# Patient Record
Sex: Female | Born: 1960
Health system: Southern US, Community
[De-identification: ages and names within clinical notes are randomized; demographics above are authoritative.]

## PROBLEM LIST (undated history)

## (undated) DIAGNOSIS — E0500 Thyrotoxicosis with diffuse goiter without thyrotoxic crisis or storm: Secondary | ICD-10-CM

## (undated) DIAGNOSIS — E079 Disorder of thyroid, unspecified: Secondary | ICD-10-CM

## (undated) HISTORY — DX: Thyrotoxicosis with diffuse goiter without thyrotoxic crisis or storm: E05.00

## (undated) HISTORY — DX: Disorder of thyroid, unspecified: E07.9

---

## 2004-08-06 HISTORY — PX: BREAST CYST EXCISION: SHX579

## 2004-08-06 HISTORY — PX: BREAST BIOPSY: SHX20

## 2007-08-12 ENCOUNTER — Encounter: Admission: RE | Admit: 2007-08-12 | Discharge: 2007-08-12 | Payer: Self-pay | Admitting: Obstetrics & Gynecology

## 2007-12-03 ENCOUNTER — Ambulatory Visit: Payer: Self-pay | Admitting: Otolaryngology

## 2008-08-06 DIAGNOSIS — E0500 Thyrotoxicosis with diffuse goiter without thyrotoxic crisis or storm: Secondary | ICD-10-CM

## 2008-08-06 HISTORY — DX: Thyrotoxicosis with diffuse goiter without thyrotoxic crisis or storm: E05.00

## 2009-04-06 DIAGNOSIS — E063 Autoimmune thyroiditis: Secondary | ICD-10-CM | POA: Insufficient documentation

## 2009-04-06 DIAGNOSIS — E079 Disorder of thyroid, unspecified: Secondary | ICD-10-CM

## 2009-04-06 HISTORY — DX: Disorder of thyroid, unspecified: E07.9

## 2009-04-28 ENCOUNTER — Encounter (HOSPITAL_COMMUNITY): Admission: RE | Admit: 2009-04-28 | Discharge: 2009-07-08 | Payer: Self-pay | Admitting: Endocrinology

## 2009-10-25 ENCOUNTER — Encounter: Admission: RE | Admit: 2009-10-25 | Discharge: 2009-10-25 | Payer: Self-pay | Admitting: Obstetrics & Gynecology

## 2010-11-03 ENCOUNTER — Other Ambulatory Visit: Payer: Self-pay | Admitting: Obstetrics & Gynecology

## 2010-11-03 DIAGNOSIS — Z0000 Encounter for general adult medical examination without abnormal findings: Secondary | ICD-10-CM

## 2010-11-06 ENCOUNTER — Ambulatory Visit
Admission: RE | Admit: 2010-11-06 | Discharge: 2010-11-06 | Disposition: A | Discharge status: Home / Self Care | Payer: Federal, State, Local not specified - PPO | Source: Ambulatory Visit | Attending: Obstetrics & Gynecology | Admitting: Obstetrics & Gynecology

## 2010-11-06 DIAGNOSIS — Z0000 Encounter for general adult medical examination without abnormal findings: Secondary | ICD-10-CM

## 2010-11-06 LAB — HM MAMMOGRAPHY: HM Mammogram: NORMAL

## 2011-04-21 ENCOUNTER — Encounter: Payer: Self-pay | Admitting: Internal Medicine

## 2011-06-01 ENCOUNTER — Other Ambulatory Visit: Payer: Self-pay | Admitting: Internal Medicine

## 2011-06-01 MED ORDER — OMEPRAZOLE 20 MG PO CPDR: 20.0000 mg | DELAYED_RELEASE_CAPSULE | Freq: Every day | ORAL | Status: DC

## 2011-10-11 ENCOUNTER — Other Ambulatory Visit: Payer: Self-pay | Admitting: Obstetrics & Gynecology

## 2011-10-11 DIAGNOSIS — Z1231 Encounter for screening mammogram for malignant neoplasm of breast: Secondary | ICD-10-CM

## 2011-11-05 ENCOUNTER — Other Ambulatory Visit: Payer: Self-pay | Admitting: Internal Medicine

## 2011-11-05 MED ORDER — OMEPRAZOLE 20 MG PO CPDR: 20.0000 mg | DELAYED_RELEASE_CAPSULE | Freq: Every day | ORAL | Status: DC

## 2011-11-07 ENCOUNTER — Ambulatory Visit (INDEPENDENT_AMBULATORY_CARE_PROVIDER_SITE_OTHER): Payer: Federal, State, Local not specified - PPO | Admitting: Internal Medicine

## 2011-11-07 ENCOUNTER — Encounter: Payer: Self-pay | Admitting: Internal Medicine

## 2011-11-07 VITALS — BP 120/68 | HR 77 | Temp 98.7°F | Resp 16 | Ht 68.0 in | Wt 142.2 lb

## 2011-11-07 DIAGNOSIS — R5383 Other fatigue: Secondary | ICD-10-CM

## 2011-11-07 DIAGNOSIS — E538 Deficiency of other specified B group vitamins: Secondary | ICD-10-CM

## 2011-11-07 DIAGNOSIS — B369 Superficial mycosis, unspecified: Secondary | ICD-10-CM

## 2011-11-07 DIAGNOSIS — R5381 Other malaise: Secondary | ICD-10-CM

## 2011-11-07 DIAGNOSIS — E0500 Thyrotoxicosis with diffuse goiter without thyrotoxic crisis or storm: Secondary | ICD-10-CM

## 2011-11-07 MED ORDER — KETOCONAZOLE 2 % EX GEL: CUTANEOUS | Status: DC

## 2011-11-07 NOTE — Patient Instructions (Signed)
We are checking your for vitamin and iron deficiencies today and for anemia.  If everything is normal,  I suggest increasing your thyroid medication to 88 mcg 6 days per week,  75 mcg one day per week and repeating a TSH in 6 weeks.   I also recommend resuming an aerobic exercise regimen (30 minutes,  5 days per week to see if it helps your energy level.

## 2011-11-07 NOTE — Progress Notes (Signed)
Patient ID: Rebecca Cole, female   DOB: 01/30/1961, 51 y.o.   MRN: [id number]  Patient Active Problem List  Diagnoses  . Graves' orbitopathy  . Thyroid disease  . Dermatitis fungal  . Fatigue    Subjective:  CC:   Chief Complaint  Patient presents with  . Follow-up    sore behind ears    HPI:   Rebecca Cole a 51 y.o. female who presents for follow up on chronic medical issues.  She continues to have a recurrent crusting dermatitis which occurs behind both ears.  At her last visit I suggested treating the rash with over-the-counter Lotrimin as I suspected that the problem was due to fungal infection do to increase moisture. The rash has improved with use of Lotrimin OTC but it recurs when she suspense treatment .  She has been experimenting with use of Seabreeze on  one side and Lotrimin on the other. This degrees essentially dries out the area and does keep the skin from becoming irritated. However the area continues to be dictated by her eyeglasses.When she stopsusing Lotrimin  the crusting and discharge increases.    2nd issue is mid day fatigue without hypersomnolence.  Finds herself wanting to sit down if standing for prolonged periods of time (over 15 minutes).  Denies chest pain, back pain, muscle pain, dizziness.  Sleeps 9-10 hours per day, feels rested when she wakes.  Vegetarian diet,  Not exercising regularly lately due to workmen being in the house in the early morning. She feels her fatigue is related to her thyroid level. Her endocrinologist has not wanted to adjust her thyroid medication and he further the patient feels that she's she has more energy when her TSH is around 1 and currently it is between 2 and 3.   Past Medical History  Diagnosis Date  . Graves' orbitopathy 2010    managed by [facility] L[facility]  . Graves Disease Sept 2010    s/p 131 ablation, managed by [facility] Dr. Lurene Shadow    Past Surgical History  Procedure Date  . Breast biopsy  2006    negative         The following portions of the patient's history were reviewed and updated as appropriate: Allergies, current medications, and problem list.    Review of Systems:   12 Pt  review of systems was negative except those addressed in the HPI,     History   Social History  . Marital Status: Unknown    Spouse Name: N/A    Number of Children: N/A  . Years of Education: N/A   Occupational History  . 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 Smoking status: Never Smoker   . Smokeless tobacco: Never Used  . Alcohol Use: Yes     moderate  . Drug Use: No  . Sexually Active: Not on file   Other Topics Concern  . 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 ROM normal. No CVA tenderness. Lungs: clear to auscultation bilaterally Heart: regular rate and rhythm, S1, S2 normal, no murmur, click, rub or gallop Abdomen: soft, non-tender; bowel sounds normal; no masses,  no organomegaly Pulses: 2+ and symmetric Skin: Skin color, texture, turgor normal. No rashes or lesions Lymph nodes: Cervical, supraclavicular, and axillary nodes normal.  Assessment and Plan:  Dermatitis fungal Since her dermatitis has improved with use of drying agents and Lotrimin but not completely resolved we discussed trying a stronger agent. Will use ketoconazole  gel twice daily behind ears for a period of one to 2 weeks and if this resolves the issue we will continue uses as needed. If this does not resolve I have recommended at this point that she see a dermatologist.  Fatigue Etiology unclear because of her history vegetarian diet I did rule out anemia including iron and B12 deficiencies. Foley is still pending on that we discussed if all of her lab work was normal that we increase her thyroid medication by one day of 88 mcg and repeat TSH in 6 weeks to make sure we do not overshoot. Our goal is a TSH of 1. This is what we'll do pending evaluation of her folate level.  Graves' orbitopathy Her orbitopathy is managed with Voltaren by Dr. Royston Sinner at [facility]. Her last exam was stable in November and she follows up with him every 6 months.    Updated Medication List Outpatient Encounter Prescriptions as of 11/07/2011  Medication Sig Dispense Refill  . Cholecalciferol (VITAMIN D3) 1000 UNITS CAPS Take 1 capsule by mouth daily.        . diclofenac (VOLTAREN) 50 MG EC tablet Take 50 mg by mouth 2 (two) times daily.        Marland Kitchen levothyroxine (SYNTHROID, LEVOTHROID) 75 MCG tablet Take 75 mcg by mouth daily. Patient takes 2 days per week       . levothyroxine (SYNTHROID, LEVOTHROID) 88 MCG tablet Take 88 mcg by mouth daily. Patient takes this 5 days a week       . omeprazole (PRILOSEC) 20 MG capsule Take 1 capsule (20 mg total) by mouth daily.  30 capsule  3  . Ketoconazole 2 % GEL Apply twice daily to affected areas until resolved.  15 g  1     Orders Placed This Encounter  Procedures  . HM MAMMOGRAPHY  . CBC with Differential  . Comprehensive metabolic panel  . Ferritin  . Vitamin B12  . Folate RBC  . Iron Binding Cap (TIBC)    No Follow-up on file.

## 2011-11-08 ENCOUNTER — Ambulatory Visit
Admission: RE | Admit: 2011-11-08 | Discharge: 2011-11-08 | Disposition: A | Discharge status: Home / Self Care | Payer: Federal, State, Local not specified - PPO | Source: Ambulatory Visit | Attending: Obstetrics & Gynecology | Admitting: Obstetrics & Gynecology

## 2011-11-08 DIAGNOSIS — Z1231 Encounter for screening mammogram for malignant neoplasm of breast: Secondary | ICD-10-CM

## 2011-11-08 LAB — COMPREHENSIVE METABOLIC PANEL
ALT: 15 U/L (ref 0–35)
Albumin: 4.1 g/dL (ref 3.5–5.2)
CO2: 26 mEq/L (ref 19–32)
Calcium: 9.1 mg/dL (ref 8.4–10.5)
GFR: 91.37 mL/min (ref >60.00)
Glucose, Bld: 94 mg/dL (ref 70–99)
Sodium: 138 mEq/L (ref 135–145)
Total Protein: 6.8 g/dL (ref 6.0–8.3)

## 2011-11-08 LAB — CBC WITH DIFFERENTIAL/PLATELET
HCT: 36.4 % (ref 36.0–46.0)
Lymphocytes Relative: 23.4 % (ref 12.0–46.0)
MCHC: 33.2 g/dL (ref 30.0–36.0)
MCV: 94.8 fl (ref 78.0–100.0)
Monocytes Absolute: 0.2 10*3/uL (ref 0.1–1.0)
Monocytes Relative: 4.4 % (ref 3.0–12.0)
Neutro Abs: 3.5 10*3/uL (ref 1.4–7.7)
Platelets: 260 10*3/uL (ref 150.0–400.0)
RBC: 3.84 Mil/uL — ABNORMAL LOW (ref 3.87–5.11)
RDW: 12.9 % (ref 11.5–14.6)

## 2011-11-08 LAB — FERRITIN: Ferritin: 19.2 ng/mL (ref 10.0–291.0)

## 2011-11-09 ENCOUNTER — Encounter: Payer: Self-pay | Admitting: Internal Medicine

## 2011-11-09 ENCOUNTER — Other Ambulatory Visit: Payer: Self-pay | Admitting: Obstetrics & Gynecology

## 2011-11-09 DIAGNOSIS — E0500 Thyrotoxicosis with diffuse goiter without thyrotoxic crisis or storm: Secondary | ICD-10-CM | POA: Insufficient documentation

## 2011-11-09 DIAGNOSIS — R928 Other abnormal and inconclusive findings on diagnostic imaging of breast: Secondary | ICD-10-CM

## 2011-11-09 DIAGNOSIS — R5383 Other fatigue: Secondary | ICD-10-CM | POA: Insufficient documentation

## 2011-11-09 DIAGNOSIS — B369 Superficial mycosis, unspecified: Secondary | ICD-10-CM | POA: Insufficient documentation

## 2011-11-09 LAB — IRON AND TIBC: UIBC: 256 ug/dL (ref 125–400)

## 2011-11-09 NOTE — Assessment & Plan Note (Signed)
Since her dermatitis has improved with use of drying agents and Lotrimin but not completely resolved we discussed trying a stronger agent. Will use ketoconazole gel twice daily behind ears for a period of one to 2 weeks and if this resolves the issue we will continue uses as needed. If this does not resolve I have recommended at this point that she see a dermatologist.

## 2011-11-09 NOTE — Assessment & Plan Note (Signed)
Etiology unclear because of her history vegetarian diet I did rule out anemia including iron and B12 deficiencies. Foley is still pending on that we discussed if all of her lab work was normal that we increase her thyroid medication by one day of 88 mcg and repeat TSH in 6 weeks to make sure we do not overshoot. Our goal is a TSH of 1. This is what we'll do pending evaluation of her folate level.

## 2011-11-09 NOTE — Assessment & Plan Note (Signed)
Her orbitopathy is managed with Voltaren by Dr. Royston Sinner at [facility]. Her last exam was stable in November and she follows up with him every 6 months.

## 2011-11-12 ENCOUNTER — Other Ambulatory Visit: Payer: Self-pay | Admitting: Internal Medicine

## 2011-11-12 MED ORDER — OMEPRAZOLE 20 MG PO CPDR: 20.0000 mg | DELAYED_RELEASE_CAPSULE | Freq: Every day | ORAL | Status: DC

## 2011-11-15 ENCOUNTER — Ambulatory Visit
Admission: RE | Admit: 2011-11-15 | Discharge: 2011-11-15 | Disposition: A | Discharge status: Home / Self Care | Payer: Federal, State, Local not specified - PPO | Source: Ambulatory Visit | Attending: Obstetrics & Gynecology | Admitting: Obstetrics & Gynecology

## 2011-11-15 DIAGNOSIS — R928 Other abnormal and inconclusive findings on diagnostic imaging of breast: Secondary | ICD-10-CM

## 2011-12-08 LAB — HM PAP SMEAR: HM Pap smear: NORMAL

## 2011-12-24 ENCOUNTER — Telehealth: Payer: Self-pay | Admitting: *Deleted

## 2011-12-24 ENCOUNTER — Other Ambulatory Visit (INDEPENDENT_AMBULATORY_CARE_PROVIDER_SITE_OTHER): Payer: Federal, State, Local not specified - PPO | Admitting: *Deleted

## 2011-12-24 DIAGNOSIS — E0500 Thyrotoxicosis with diffuse goiter without thyrotoxic crisis or storm: Secondary | ICD-10-CM

## 2011-12-24 DIAGNOSIS — Z0000 Encounter for general adult medical examination without abnormal findings: Secondary | ICD-10-CM

## 2011-12-24 NOTE — Telephone Encounter (Signed)
Patient came in for labs today to have her thyroid rechecked. While she was here she stated that she is having a physical in early June and was asking if she could go ahead and have those labs drawn while she was here so that you would have the results when she comes to see you. If so what would you like for me to order?

## 2011-12-24 NOTE — Telephone Encounter (Signed)
Fasting lipids , CMET and CBC w diff..  thanks

## 2011-12-24 NOTE — Progress Notes (Signed)
Addended by: Jobie Quaker on: 12/24/2011 02:30 PM   Modules accepted: Orders

## 2011-12-24 NOTE — Telephone Encounter (Signed)
Labs ordered.

## 2011-12-25 ENCOUNTER — Other Ambulatory Visit: Payer: Federal, State, Local not specified - PPO

## 2011-12-25 LAB — TSH: TSH: 3.17 u[IU]/mL (ref 0.35–5.50)

## 2011-12-25 LAB — CBC WITH DIFFERENTIAL/PLATELET
Eosinophils Relative: 4.1 % (ref 0.0–5.0)
Lymphocytes Relative: 24 % (ref 12.0–46.0)
Lymphs Abs: 1 10*3/uL (ref 0.7–4.0)
MCHC: 33.8 g/dL (ref 30.0–36.0)
Monocytes Absolute: 0.3 10*3/uL (ref 0.1–1.0)
Platelets: 269 10*3/uL (ref 150.0–400.0)
WBC: 4.3 10*3/uL — ABNORMAL LOW (ref 4.5–10.5)

## 2011-12-25 LAB — COMPREHENSIVE METABOLIC PANEL
AST: 19 U/L (ref 0–37)
Albumin: 4.1 g/dL (ref 3.5–5.2)
Alkaline Phosphatase: 44 U/L (ref 39–117)
BUN: 10 mg/dL (ref 6–23)
CO2: 26 mEq/L (ref 19–32)
Calcium: 8.6 mg/dL (ref 8.4–10.5)
Chloride: 107 mEq/L (ref 96–112)
Creatinine, Ser: 0.6 mg/dL (ref 0.4–1.2)
Glucose, Bld: 73 mg/dL (ref 70–99)
Total Bilirubin: 0.6 mg/dL (ref 0.3–1.2)

## 2011-12-25 LAB — LIPID PANEL: Cholesterol: 200 mg/dL (ref 0–200)

## 2011-12-26 ENCOUNTER — Telehealth: Payer: Self-pay | Admitting: Internal Medicine

## 2011-12-26 NOTE — Telephone Encounter (Signed)
Thyroid level is still 3 despite [place] increasing hr dose of synthroid to 88 mcg one more day per week.  Cholesterol is fine

## 2011-12-26 NOTE — Telephone Encounter (Signed)
Left message asking patient to return my call.

## 2011-12-27 NOTE — Telephone Encounter (Signed)
LMOM with contact name & number to Inform patient/SLS

## 2011-12-27 NOTE — Telephone Encounter (Signed)
I would suggest increasing the dose to 88 mcg daily all 7 days and repeat a TSH in 6 weeks

## 2011-12-27 NOTE — Telephone Encounter (Signed)
Patient requesting directions as to what needs to be done at this point to lower thyroid level/SLS Please advise.

## 2012-01-08 ENCOUNTER — Encounter: Payer: Self-pay | Admitting: Internal Medicine

## 2012-01-08 ENCOUNTER — Ambulatory Visit (INDEPENDENT_AMBULATORY_CARE_PROVIDER_SITE_OTHER): Payer: Federal, State, Local not specified - PPO | Admitting: Internal Medicine

## 2012-01-08 VITALS — BP 104/62 | HR 58 | Temp 97.9°F | Resp 14 | Ht 68.0 in | Wt 138.0 lb

## 2012-01-08 DIAGNOSIS — E0500 Thyrotoxicosis with diffuse goiter without thyrotoxic crisis or storm: Secondary | ICD-10-CM

## 2012-01-08 DIAGNOSIS — Z1211 Encounter for screening for malignant neoplasm of colon: Secondary | ICD-10-CM

## 2012-01-08 DIAGNOSIS — Z0000 Encounter for general adult medical examination without abnormal findings: Secondary | ICD-10-CM

## 2012-01-08 DIAGNOSIS — E079 Disorder of thyroid, unspecified: Secondary | ICD-10-CM

## 2012-01-08 MED ORDER — DOXYCYCLINE HYCLATE 100 MG PO TABS: 100.0000 mg | ORAL_TABLET | Freq: Two times a day (BID) | ORAL | Status: AC

## 2012-01-08 NOTE — Assessment & Plan Note (Addendum)
Currently at 88 mcg daily   tsh 3.00  On may 20.  TSH is due for repeat July 7 or so,  Will adjust with 100 mcg two days per week and 88  5 days if still not at a goal tsh 1..0

## 2012-01-08 NOTE — Progress Notes (Signed)
Patient ID: Rebecca Cole, female   DOB: 1961/07/14, 51 y.o.   MRN: [id number]   Patient Active Problem List  Diagnoses  . Graves' orbitopathy  . Thyroid disease  . Dermatitis fungal  . Fatigue  . Routine general medical examination at a health care facility    Subjective:  CC:   Chief Complaint  Patient presents with  . Annual Exam    HPI: presents for her annual nonGYN exam.  She had her breast exam, mammogram and PAP smear last month by Dr. Seymour Bars in [place].  She has been kayaking and following a low glycemic index diet.  Still having fatigue despite increasing her thyroid dose last week for TSH of 3.0, now on 88 mcg daily. No new issues,  Wear her seat belts 100% of the time.  Menses are starting to become irregular and light .  No hot flashes yet.    Rebecca Cole a 51 y.o. female who presents   Past Medical History  Diagnosis Date  . Graves' orbitopathy 2010    managed by [facility] L[facility]  . Graves Disease Sept 2010    s/p 131 ablation, managed by [facility] Dr. Lurene Shadow    Past Surgical History  Procedure Date  . Breast biopsy 2006    negative         The following portions of the patient's history were reviewed and updated as appropriate: Allergies, current medications, and problem list.    Review of Systems:   12 Pt  review of systems was negative except those addressed in the HPI,     History   Social History  . Marital Status: Single    Spouse Name: N/A    Number of Children: N/A  . Years of Education: N/A   Occupational History  . 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 Smoking status: Never Smoker   . Smokeless tobacco: Never Used  . Alcohol Use: Yes     moderate  . Drug Use: No  . Sexually Active: Not on file   Other Topics Concern  . 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 ROM normal. No CVA tenderness. Lungs: clear to auscultation bilaterally Heart: regular rate and rhythm, S1, S2 normal, no murmur, click, rub or gallop Abdomen: soft, non-tender; bowel sounds normal; no masses,  no organomegaly Pulses: 2+ and symmetric Skin: Skin color, texture, turgor normal. No rashes or lesions Lymph nodes: Cervical, supraclavicular, and axillary nodes normal.  Assessment and Plan: Thyroid disease Currently at 88 mcg daily   tsh 3.00  On may 20.  TSH is due for repeat July 7 or so,  Will adjust with 100 mcg two days per week and 88  5 days if still not at a goal tsh 1..0  Graves' orbitopathy improving with current therapy   Screening for colon cancer Referring to  [facility] for colon CA screening    Updated Medication List Outpatient Encounter Prescriptions as of 01/08/2012  Medication Sig Dispense Refill  . Cholecalciferol (VITAMIN D3) 1000 UNITS CAPS Take 1 capsule by mouth daily.        . diclofenac (VOLTAREN) 50 MG EC tablet Take 50 mg by mouth 2 (two) times daily.        Marland Kitchen Ketoconazole 2 % GEL Apply twice daily to affected areas until resolved.  15 g  1  . levothyroxine (SYNTHROID, LEVOTHROID) 88 MCG tablet Take 88 mcg by mouth daily.       Marland Kitchen omeprazole (PRILOSEC) 20 MG capsule Take 1 capsule (20 mg total) by mouth daily.  30 capsule  3  . Selenium (SELENIMIN-200 PO) Take by mouth 2 (two) times daily.      Marland Kitchen doxycycline (VIBRA-TABS) 100 MG tablet Take 1 tablet (100 mg total) by  mouth 2 (two) times daily.  14 tablet  0  . DISCONTD: levothyroxine (SYNTHROID, LEVOTHROID) 75 MCG tablet Take 75 mcg by mouth daily. Patient takes 2 days per week

## 2012-01-08 NOTE — Assessment & Plan Note (Signed)
improving with current therapy

## 2012-01-08 NOTE — Assessment & Plan Note (Signed)
Referring to [facility] for colon CA screening

## 2012-02-12 ENCOUNTER — Other Ambulatory Visit (INDEPENDENT_AMBULATORY_CARE_PROVIDER_SITE_OTHER): Payer: Federal, State, Local not specified - PPO | Admitting: *Deleted

## 2012-02-12 DIAGNOSIS — E079 Disorder of thyroid, unspecified: Secondary | ICD-10-CM

## 2012-02-12 LAB — CBC WITH DIFFERENTIAL/PLATELET
Eosinophils Absolute: 0.2 10*3/uL (ref 0.0–0.7)
Eosinophils Relative: 2.9 % (ref 0.0–5.0)
Lymphocytes Relative: 22.7 % (ref 12.0–46.0)
Lymphs Abs: 1.2 10*3/uL (ref 0.7–4.0)
Neutrophils Relative %: 68.1 % (ref 43.0–77.0)
Platelets: 258 10*3/uL (ref 150.0–400.0)
RDW: 13.8 % (ref 11.5–14.6)

## 2012-02-12 LAB — TSH: TSH: 1.84 u[IU]/mL (ref 0.35–5.50)

## 2012-03-25 ENCOUNTER — Other Ambulatory Visit (INDEPENDENT_AMBULATORY_CARE_PROVIDER_SITE_OTHER): Payer: Federal, State, Local not specified - PPO | Admitting: *Deleted

## 2012-03-25 DIAGNOSIS — E0500 Thyrotoxicosis with diffuse goiter without thyrotoxic crisis or storm: Secondary | ICD-10-CM

## 2012-03-26 MED ORDER — LEVOTHYROXINE SODIUM 100 MCG PO TABS: 100.0000 ug | ORAL_TABLET | Freq: Every day | ORAL | Status: DC

## 2012-03-26 NOTE — Addendum Note (Signed)
Addended by: Duncan Dull on: 03/26/2012 05:12 PM   Modules accepted: Orders

## 2012-05-13 ENCOUNTER — Other Ambulatory Visit (INDEPENDENT_AMBULATORY_CARE_PROVIDER_SITE_OTHER): Payer: Federal, State, Local not specified - PPO

## 2012-05-13 DIAGNOSIS — Z23 Encounter for immunization: Secondary | ICD-10-CM

## 2012-05-13 DIAGNOSIS — E079 Disorder of thyroid, unspecified: Secondary | ICD-10-CM

## 2012-05-13 NOTE — Addendum Note (Signed)
Addended byWilley Blade on: 05/13/2012 03:53 PM   Modules accepted: Orders

## 2012-05-19 ENCOUNTER — Telehealth: Payer: Self-pay | Admitting: Internal Medicine

## 2012-05-19 NOTE — Telephone Encounter (Signed)
Pt returned your call.  

## 2012-05-20 ENCOUNTER — Telehealth: Payer: Self-pay | Admitting: Internal Medicine

## 2012-05-20 ENCOUNTER — Other Ambulatory Visit: Payer: Self-pay

## 2012-05-20 MED ORDER — LEVOTHYROXINE SODIUM 112 MCG PO TABS: 112.0000 ug | ORAL_TABLET | Freq: Every day | ORAL | Status: DC

## 2012-05-20 NOTE — Telephone Encounter (Signed)
Levothyroxine 112 mg # 30 3 R sent electronic to [facility].

## 2012-05-20 NOTE — Telephone Encounter (Signed)
Dan from [facility] is needing clarrification on a med that was E-Scribed. The Levothyroxine directions  Call Back [phone]

## 2012-05-21 NOTE — Telephone Encounter (Signed)
Returned pharmacy call with Levothyroxin clarification.

## 2012-07-05 LAB — HM COLONOSCOPY

## 2012-08-10 ENCOUNTER — Other Ambulatory Visit: Payer: Self-pay | Admitting: Internal Medicine

## 2012-08-19 ENCOUNTER — Other Ambulatory Visit: Payer: Self-pay | Admitting: *Deleted

## 2012-08-19 ENCOUNTER — Telehealth: Payer: Self-pay | Admitting: *Deleted

## 2012-08-19 ENCOUNTER — Other Ambulatory Visit (INDEPENDENT_AMBULATORY_CARE_PROVIDER_SITE_OTHER): Payer: Federal, State, Local not specified - PPO

## 2012-08-19 DIAGNOSIS — E079 Disorder of thyroid, unspecified: Secondary | ICD-10-CM

## 2012-08-19 NOTE — Telephone Encounter (Signed)
TSH and T4 thanks

## 2012-08-19 NOTE — Telephone Encounter (Signed)
What labs and dx would you like for this pt? Just a TSH? Thank you

## 2012-08-24 MED ORDER — LEVOTHYROXINE SODIUM 112 MCG PO TABS: 112.0000 ug | ORAL_TABLET | Freq: Every day | ORAL | Status: DC

## 2012-08-24 NOTE — Addendum Note (Signed)
Addended by: Sherlene Shams on: 08/24/2012 06:33 PM   Modules accepted: Orders, Medications

## 2012-08-24 NOTE — Assessment & Plan Note (Signed)
Changing dose to 112 mcg daily for TSh 3.54 on alternating dose of 100/112

## 2012-11-17 ENCOUNTER — Other Ambulatory Visit (INDEPENDENT_AMBULATORY_CARE_PROVIDER_SITE_OTHER): Payer: Federal, State, Local not specified - PPO

## 2012-11-17 ENCOUNTER — Telehealth: Payer: Self-pay | Admitting: Emergency Medicine

## 2012-11-17 DIAGNOSIS — E039 Hypothyroidism, unspecified: Secondary | ICD-10-CM

## 2012-11-17 NOTE — Telephone Encounter (Signed)
Absolutely.  She does not need to fast .  Come in ASAP for blood test.

## 2012-11-17 NOTE — Telephone Encounter (Signed)
Patient coming in a 2:45 p.m.

## 2012-11-17 NOTE — Telephone Encounter (Signed)
Patient called and stated that she is due for TSH. Can we place an order for this? Patient states she has been taking the 88 mcg of her medication, she states she is taking this due to a conflict with Morrie Sheldon when the birth control messed with her medication and she wasn't feeling well on the of synthroid. She stated Morrie Sheldon was no help and in January all her labs were messed up around a 3 and she was NOT feeling well, she stated this to [facility] and she said continue the since labs were within normal range.  Patient is taking synthroid 88 mcg (off of the Birth Control), which is her old script, in which she feels "ok" while on the 88 mcg but is still tired. She only have enough 3 more days on the 88 mcg. Can we please place and order for the TSH and write a script for the if labs are in within normal range.

## 2012-11-17 NOTE — Telephone Encounter (Signed)
Advised patient to have TSH drawn, appointment scheduled for 11/18/2012.

## 2012-11-18 ENCOUNTER — Other Ambulatory Visit: Payer: Federal, State, Local not specified - PPO

## 2012-11-18 LAB — TSH+FREE T4: Free T4: 1.47 ng/dL (ref 0.82–1.77)

## 2012-11-19 MED ORDER — LEVOTHYROXINE SODIUM 100 MCG PO TABS: 100.0000 ug | ORAL_TABLET | Freq: Every day | ORAL | Status: DC

## 2012-11-19 NOTE — Addendum Note (Signed)
Addended by: Sherlene Shams on: 11/19/2012 03:20 PM   Modules accepted: Orders

## 2012-11-27 ENCOUNTER — Other Ambulatory Visit: Payer: Self-pay

## 2012-11-27 DIAGNOSIS — Z1231 Encounter for screening mammogram for malignant neoplasm of breast: Secondary | ICD-10-CM

## 2012-12-04 ENCOUNTER — Ambulatory Visit
Admission: RE | Admit: 2012-12-04 | Discharge: 2012-12-04 | Disposition: A | Discharge status: Home / Self Care | Payer: Federal, State, Local not specified - PPO | Source: Ambulatory Visit

## 2012-12-04 DIAGNOSIS — Z1231 Encounter for screening mammogram for malignant neoplasm of breast: Secondary | ICD-10-CM

## 2013-01-02 ENCOUNTER — Encounter: Payer: Self-pay | Admitting: Internal Medicine

## 2013-01-02 ENCOUNTER — Ambulatory Visit (INDEPENDENT_AMBULATORY_CARE_PROVIDER_SITE_OTHER): Payer: Federal, State, Local not specified - PPO | Admitting: Internal Medicine

## 2013-01-02 VITALS — BP 102/60 | HR 56 | Temp 98.2°F | Resp 14 | Ht 69.0 in | Wt 139.2 lb

## 2013-01-02 DIAGNOSIS — D62 Acute posthemorrhagic anemia: Secondary | ICD-10-CM

## 2013-01-02 DIAGNOSIS — R5381 Other malaise: Secondary | ICD-10-CM

## 2013-01-02 DIAGNOSIS — B369 Superficial mycosis, unspecified: Secondary | ICD-10-CM

## 2013-01-02 DIAGNOSIS — E039 Hypothyroidism, unspecified: Secondary | ICD-10-CM

## 2013-01-02 DIAGNOSIS — D649 Anemia, unspecified: Secondary | ICD-10-CM

## 2013-01-02 DIAGNOSIS — E079 Disorder of thyroid, unspecified: Secondary | ICD-10-CM

## 2013-01-02 DIAGNOSIS — R5383 Other fatigue: Secondary | ICD-10-CM

## 2013-01-02 DIAGNOSIS — Z13220 Encounter for screening for lipoid disorders: Secondary | ICD-10-CM

## 2013-01-02 LAB — COMPREHENSIVE METABOLIC PANEL
CO2: 25 mEq/L (ref 19–32)
Creatinine, Ser: 0.8 mg/dL (ref 0.4–1.2)
GFR: 76.12 mL/min (ref >60.00)
Glucose, Bld: 104 mg/dL — ABNORMAL HIGH (ref 70–99)
Total Bilirubin: 0.8 mg/dL (ref 0.3–1.2)

## 2013-01-02 LAB — CBC WITH DIFFERENTIAL/PLATELET
Basophils Absolute: 0 10*3/uL (ref 0.0–0.1)
Eosinophils Relative: 1.4 % (ref 0.0–5.0)
HCT: 37.5 % (ref 36.0–46.0)
Hemoglobin: 12.6 g/dL (ref 12.0–15.0)
Lymphs Abs: 1 10*3/uL (ref 0.7–4.0)
Monocytes Relative: 6 % (ref 3.0–12.0)
Neutro Abs: 3 10*3/uL (ref 1.4–7.7)
RDW: 14 % (ref 11.5–14.6)

## 2013-01-02 LAB — LIPID PANEL
Cholesterol: 207 mg/dL — ABNORMAL HIGH (ref 0–200)
HDL: 59.2 mg/dL (ref >39.00)
VLDL: 15.4 mg/dL (ref 0.0–40.0)

## 2013-01-02 LAB — HM PAP SMEAR: HM Pap smear: NORMAL

## 2013-01-02 LAB — CORTISOL: Cortisol, Plasma: 12 ug/dL

## 2013-01-02 MED ORDER — KETOCONAZOLE 2 % EX CREA: TOPICAL_CREAM | Freq: Every day | CUTANEOUS | Status: DC

## 2013-01-02 NOTE — Patient Instructions (Signed)
We are repeating tests for anemia and adrenal insufficiency along with your thyroid tests  I think it would be a good idea to have Dr Lafe Garin see you Rebecca Cole endocrine) once the tests are complte  Ketoconazole cream for the ear rash

## 2013-01-02 NOTE — Progress Notes (Signed)
Patient ID: Rebecca Cole, female   DOB: 10-30-1960, 52 y.o.   MRN: [id number]   Patient Active Problem List   Diagnosis Date Noted  . Routine general medical examination at a health care facility 01/08/2012  . Screening for colon cancer 01/08/2012  . Dermatitis fungal 11/09/2011  . Fatigue 11/09/2011  . Graves' orbitopathy   . Thyroid disease 04/06/2009    Subjective:  CC:   Chief Complaint  Patient presents with  . Annual Exam    HPI:   Rebecca Cole a 52 y.o. female who presents for follow up on  Hypothyroidism .  Her annual exam was done last week  and included PAP smear and breast exam..  Cc extreme persistent  fatigue.,  She is sleeping 9 to 10 hours daily  And  Still feeling tired and ready to fall asleep during the day, despite staying very physically active.  She kayaks 5 days per week,  Does aerobics the other days,  And states that she never felt this way until she developed Graves disease several years ago.  Extreme fatigue is unaccompanied by chest pain, shortness of breath, claudication symptoms and weight gain,  Does not snore. Her  bedsheets are unperturbed when she wakes up in the morning.   No nocturia or polyuria.  Her degree of fatigue is very frustrating, "I thought once I'd retired I'd have a lot more living to do."   Past Medical History  Diagnosis Date  . Graves' orbitopathy 2010    managed by [facility] L[facility]  . Graves Disease Sept 2010    s/p 131 ablation, managed by [facility] Dr. Lurene Shadow    Past Surgical History  Procedure Laterality Date  . Breast biopsy  2006    negative       The following portions of the patient's history were reviewed and updated as appropriate: Allergies, current medications, and problem list.    Review of Systems:   12 Pt  review of systems was negative except those addressed in the HPI,     History   Social History  . Marital Status: Single    Spouse Name: N/A    Number of Children: N/A  .  Years of Education: N/A   Occupational History  . 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 Smoking status: Never Smoker   . Smokeless tobacco: Never Used  . Alcohol Use: Yes     Comment: moderate  . Drug Use: No  . Sexually Active: Not on file   Other Topics Concern  . Not on file   Social History Narrative   Full-time air traffic controller, has Education officer, community.   Has no pets    Objective:  BP 102/60  Pulse 56  Temp(Src) 98.2 F (36.8 C) (Oral)  Resp 14  Ht 5\' 9"  (1.753 m)  Wt 139 lb 4 oz (63.163 kg)  BMI 20.55 kg/m2  SpO2 98%  LMP 12/13/2012  General appearance: alert, cooperative and appears stated age Ears: normal TM's and external ear canals both ears Throat: lips, mucosa, and tongue normal; teeth and gums normal Neck: no adenopathy, no carotid bruit, supple, symmetrical, trachea midline and thyroid not enlarged, symmetric, no tenderness/mass/nodules Back: symmetric, no curvature. ROM normal. No CVA tenderness. Lungs: clear to auscultation bilaterally Heart: regular rate and rhythm, S1, S2 normal, no murmur, click, rub or gallop Abdomen: soft, non-tender; bowel sounds normal; no masses,  no organomegaly Pulses: 2+ and symmetric Skin:  Skin color, texture, turgor normal. No rashes or lesions Lymph nodes: Cervical, supraclavicular, and axillary nodes normal.  Assessment and Plan:  Dermatitis fungal Behind right ear in the crease .  Refill antifungal cream  Fatigue Etiology must be related to Graves Disease , since she is not anemic, does not snore,  Is physically active,  Normal weight, and has no signs of pulmonary disease or diabetes.  If thyroid function is not currently on the active side, will recommend stress ECHO and sleep study  Thyroid disease Repeat TSh and Free T4 are pending after dose was increased 6 weeks ago to 100 mcg daily   Updated Medication List Outpatient Encounter Prescriptions as of 01/02/2013   Medication Sig Dispense Refill  . Cholecalciferol (VITAMIN D3) 1000 UNITS CAPS Take 1 capsule by mouth daily.        Marland Kitchen levothyroxine (SYNTHROID, LEVOTHROID) 100 MCG tablet Take 1 tablet (100 mcg total) by mouth daily.  90 tablet  3  . diclofenac (VOLTAREN) 50 MG EC tablet Take 50 mg by mouth 2 (two) times daily.        Marland Kitchen ketoconazole (NIZORAL) 2 % cream Apply topically daily.  15 g  2  . omeprazole (PRILOSEC) 20 MG capsule TAKE ONE CAPSULE BY MOUTH ONE TIME DAILY  30 capsule  2  . Selenium (SELENIMIN-200 PO) Take by mouth 2 (two) times daily.      . [DISCONTINUED] Ketoconazole 2 % GEL Apply twice daily to affected areas until resolved.  15 g  1   No facility-administered encounter medications on file as of 01/02/2013.     Orders Placed This Encounter  Procedures  . HM PAP SMEAR  . TSH + free T4  . Cortisol  . Vitamin B12  . Folate RBC  . CBC with Differential  . Comprehensive metabolic panel  . Lipid panel  . LDL cholesterol, direct  . HM COLONOSCOPY    No Follow-up on file.

## 2013-01-04 ENCOUNTER — Encounter: Payer: Self-pay | Admitting: Internal Medicine

## 2013-01-04 NOTE — Assessment & Plan Note (Addendum)
Repeat TSh and Free T4 are pending after dose was increased 6 weeks ago to 100 mcg daily

## 2013-01-04 NOTE — Assessment & Plan Note (Addendum)
Behind right ear in the crease .  Refill antifungal cream

## 2013-01-04 NOTE — Assessment & Plan Note (Signed)
Etiology must be related to Graves Disease , since she is not anemic, does not snore,  Is physically active,  Normal weight, and has no signs of pulmonary disease or diabetes.  If thyroid function is not currently on the active side, will recommend stress ECHO and sleep study

## 2013-01-05 ENCOUNTER — Telehealth: Payer: Self-pay | Admitting: Internal Medicine

## 2013-01-05 LAB — TSH+FREE T4
Free T4: 1.52 ng/dL (ref 0.82–1.77)
TSH: 0.943 u[IU]/mL (ref 0.450–4.500)

## 2013-01-05 LAB — FOLATE RBC: RBC Folate: 799 ng/mL (ref >=366)

## 2013-01-06 ENCOUNTER — Encounter: Payer: Self-pay | Admitting: Internal Medicine

## 2013-01-06 DIAGNOSIS — Q453 Other congenital malformations of pancreas and pancreatic duct: Secondary | ICD-10-CM

## 2013-01-06 DIAGNOSIS — K769 Liver disease, unspecified: Secondary | ICD-10-CM

## 2013-01-08 ENCOUNTER — Encounter: Payer: Self-pay | Admitting: Gastroenterology

## 2013-01-08 ENCOUNTER — Encounter: Payer: Self-pay | Admitting: Emergency Medicine

## 2013-02-09 ENCOUNTER — Ambulatory Visit: Payer: Federal, State, Local not specified - PPO | Admitting: Gastroenterology

## 2013-03-20 ENCOUNTER — Encounter: Payer: Self-pay | Admitting: Internal Medicine

## 2013-03-20 DIAGNOSIS — E039 Hypothyroidism, unspecified: Secondary | ICD-10-CM

## 2013-03-23 ENCOUNTER — Other Ambulatory Visit (INDEPENDENT_AMBULATORY_CARE_PROVIDER_SITE_OTHER): Payer: Federal, State, Local not specified - PPO

## 2013-03-23 DIAGNOSIS — E039 Hypothyroidism, unspecified: Secondary | ICD-10-CM

## 2013-03-24 ENCOUNTER — Encounter: Payer: Self-pay | Admitting: Internal Medicine

## 2013-06-11 ENCOUNTER — Other Ambulatory Visit: Payer: Self-pay

## 2013-08-10 NOTE — Telephone Encounter (Signed)
No additional note needed 

## 2013-11-16 ENCOUNTER — Other Ambulatory Visit: Payer: Self-pay | Admitting: Internal Medicine

## 2013-11-19 ENCOUNTER — Other Ambulatory Visit: Payer: Self-pay

## 2013-11-19 DIAGNOSIS — Z1231 Encounter for screening mammogram for malignant neoplasm of breast: Secondary | ICD-10-CM

## 2013-12-04 LAB — HM MAMMOGRAPHY: HM MAMMO: NORMAL

## 2013-12-15 ENCOUNTER — Ambulatory Visit: Payer: Federal, State, Local not specified - PPO

## 2013-12-22 ENCOUNTER — Ambulatory Visit
Admission: RE | Admit: 2013-12-22 | Discharge: 2013-12-22 | Disposition: A | Discharge status: Home / Self Care | Payer: Federal, State, Local not specified - PPO | Source: Ambulatory Visit

## 2013-12-22 DIAGNOSIS — Z1231 Encounter for screening mammogram for malignant neoplasm of breast: Secondary | ICD-10-CM

## 2014-01-04 ENCOUNTER — Encounter: Payer: Self-pay | Admitting: Internal Medicine

## 2014-01-04 ENCOUNTER — Ambulatory Visit (INDEPENDENT_AMBULATORY_CARE_PROVIDER_SITE_OTHER): Payer: Federal, State, Local not specified - PPO | Admitting: Internal Medicine

## 2014-01-04 VITALS — BP 122/80 | HR 67 | Temp 98.7°F | Resp 16 | Ht 68.5 in | Wt 138.0 lb

## 2014-01-04 DIAGNOSIS — E785 Hyperlipidemia, unspecified: Secondary | ICD-10-CM

## 2014-01-04 DIAGNOSIS — Z1211 Encounter for screening for malignant neoplasm of colon: Secondary | ICD-10-CM

## 2014-01-04 DIAGNOSIS — E559 Vitamin D deficiency, unspecified: Secondary | ICD-10-CM

## 2014-01-04 DIAGNOSIS — R5383 Other fatigue: Secondary | ICD-10-CM

## 2014-01-04 DIAGNOSIS — E038 Other specified hypothyroidism: Secondary | ICD-10-CM

## 2014-01-04 DIAGNOSIS — E063 Autoimmune thyroiditis: Secondary | ICD-10-CM

## 2014-01-04 DIAGNOSIS — Z0000 Encounter for general adult medical examination without abnormal findings: Secondary | ICD-10-CM

## 2014-01-04 DIAGNOSIS — R5381 Other malaise: Secondary | ICD-10-CM

## 2014-01-04 DIAGNOSIS — E039 Hypothyroidism, unspecified: Secondary | ICD-10-CM

## 2014-01-04 LAB — CBC WITH DIFFERENTIAL/PLATELET
Basophils Absolute: 0 K/uL (ref 0.0–0.1)
Basophils Relative: 0.6 % (ref 0.0–3.0)
Eosinophils Absolute: 0.1 K/uL (ref 0.0–0.7)
Eosinophils Relative: 2.2 % (ref 0.0–5.0)
HCT: 36.1 % (ref 36.0–46.0)
Hemoglobin: 12 g/dL (ref 12.0–15.0)
Lymphocytes Relative: 27.6 % (ref 12.0–46.0)
Lymphs Abs: 1.3 K/uL (ref 0.7–4.0)
MCHC: 33.1 g/dL (ref 30.0–36.0)
MCV: 92.8 fl (ref 78.0–100.0)
Monocytes Absolute: 0.3 K/uL (ref 0.1–1.0)
Monocytes Relative: 6.9 % (ref 3.0–12.0)
Neutro Abs: 2.9 K/uL (ref 1.4–7.7)
Neutrophils Relative %: 62.7 % (ref 43.0–77.0)
Platelets: 286 K/uL (ref 150.0–400.0)
RBC: 3.89 Mil/uL (ref 3.87–5.11)
RDW: 14.3 % (ref 11.5–15.5)
WBC: 4.7 K/uL (ref 4.0–10.5)

## 2014-01-04 LAB — COMPREHENSIVE METABOLIC PANEL WITH GFR
ALT: 13 U/L (ref 0–35)
AST: 14 U/L (ref 0–37)
Albumin: 4.1 g/dL (ref 3.5–5.2)
Alkaline Phosphatase: 50 U/L (ref 39–117)
BUN: 11 mg/dL (ref 6–23)
CO2: 28 meq/L (ref 19–32)
Calcium: 9.1 mg/dL (ref 8.4–10.5)
Chloride: 102 meq/L (ref 96–112)
Creatinine, Ser: 0.8 mg/dL (ref 0.4–1.2)
GFR: 82.59 mL/min
Glucose, Bld: 86 mg/dL (ref 70–99)
Potassium: 4.1 meq/L (ref 3.5–5.1)
Sodium: 136 meq/L (ref 135–145)
Total Bilirubin: 0.9 mg/dL (ref 0.2–1.2)
Total Protein: 6.4 g/dL (ref 6.0–8.3)

## 2014-01-04 LAB — T4, FREE: Free T4: 1.26 ng/dL (ref 0.60–1.60)

## 2014-01-04 LAB — LIPID PANEL
Cholesterol: 178 mg/dL (ref 0–200)
HDL: 62.2 mg/dL
LDL Cholesterol: 106 mg/dL — ABNORMAL HIGH (ref 0–99)
Total CHOL/HDL Ratio: 3
Triglycerides: 51 mg/dL (ref 0.0–149.0)
VLDL: 10.2 mg/dL (ref 0.0–40.0)

## 2014-01-04 LAB — VITAMIN B12: Vitamin B-12: 323 pg/mL (ref 211–911)

## 2014-01-04 LAB — TSH: TSH: 0.97 u[IU]/mL (ref 0.35–4.50)

## 2014-01-04 MED ORDER — DOXYCYCLINE HYCLATE 100 MG PO TABS: 100.0000 mg | ORAL_TABLET | Freq: Two times a day (BID) | ORAL | Status: AC

## 2014-01-04 NOTE — Assessment & Plan Note (Addendum)
Talking a supplement 1000 units twice weekly.  Current level is normal

## 2014-01-04 NOTE — Patient Instructions (Signed)
[place] Spotted Fever [place] Spotted Fever (RMSF) is the oldest known tick-borne disease of people in the [place]. This disease was named because it was first described among people in the [facility] area who had an illness characterized by a rash with red-purple-black spots. This disease is caused by a rickettsia (Rickettsia rickettsii), a bacteria carried by the tick. The [facility] wood tick and the American dog tick, acquire and transmit the RMSF bacteria (pictures NOT actual size). When a larval, nymphal or adult tick feeds on an infected rodent or larger animal, the tick can become infected. Infected adult ticks then feed on people who may then get RMSF. The tick transmits the disease to humans during a prolonged period of feeding that lasts many hours, days or even a couple weeks. The bite is painless and frequently goes unnoticed. An infected female tick may also pass the rickettsial bacteria to her eggs that then may mature to be infected adult ticks. The rickettsia that causes RMSF can also get into a person's body through damaged skin. A tick bite is not necessary. People can get RMSF if they crush a tick and get it's blood or body fluids on their skin through a small cut or sore.  DIAGNOSIS Diagnosis is made by laboratory tests.  TREATMENT Treatment is with antibiotics (medications that kill rickettsia and other bacteria). Immediate treatment usually prevents death. GEOGRAPHIC RANGE This disease was reported only in the [facility] until 1931. RMSF has more recently been described among individuals in all states except [place], [place] and [place]. The highest reported incidences of RMSF now occur among residents of [place], [place], [place] and the [place]. TIME OF YEAR  Most cases are diagnosed during late spring and summer when ticks are most active. However, especially in the warmer [place] states, a few cases occur during the winter. SYMPTOMS    Symptoms of RMSF begin from 2 to 14 days after a tick bite. The most common early symptoms are fever, muscle aches and headache followed by nausea (feeling sick to your stomach) or vomiting.  The RMSF rash is typically delayed until 3 or more days after symptom onset, and eventually develops in 9 of 10 infected patients by the 5th day of illness. If the disease is not treated it can cause death. If you get a fever, headache, muscle aches, rash, nausea or vomiting within 2 weeks of a possible tick bite or exposure you should see your caregiver immediately. PREVENTION Ticks prefer to hide in shady, moist ground litter. They can often be found above the ground clinging to tall grass, brush, shrubs and low tree branches. They also inhabit lawns and gardens, especially at the edges of woodlands and around old stone walls. Within the areas where ticks generally live, no naturally vegetated area can be considered completely free of infected ticks. The best precaution against RMSF is to avoid contact with soil, leaf litter and vegetation as much as possible in tick infested areas. For those who enjoy gardening or walking in their yards, clear brush and mow tall grass around houses and at the edges of gardens. This may help reduce the tick population in the immediate area. Applications of chemical insecticides by a licensed professional in the spring (late May) and Fall (September) will also control ticks, especially in heavily infested areas. Treatment will never get rid of all the ticks. Getting rid of small animal populations that host ticks will also decrease the tick population. When working in the garden,  pruning shrubs, or handling soil and vegetation, wear light-colored protective clothing and gloves. Spot-check often to prevent ticks from reaching the skin. Ticks cannot jump or fly. They will not drop from an above-ground perch onto a passing animal. Once a tick gains access to human skin it climbs upward  until it reaches a more protected area. For example, the back of the knee, groin, navel, armpit, ears or nape of the neck. It then begins the slow process of embedding itself in the skin. Campers, hikers, field workers, and others who spend time in wooded, brushy or tall grassy areas can avoid exposure to ticks by using the following precautions:  Wear light-colored clothing with a tight weave to spot ticks more easily and prevent contact with the skin.  Wear long pants tucked into socks, long-sleeved shirts tucked into pants and enclosed shoes or boots along with insect repellent.  Spray clothes with insect repellent containing either DEET or Permethrin. Only DEET can be used on exposed skin. Follow the manufacturer's directions carefully.  Wear a hat and keep long hair pulled back.  Stay on cleared, well-worn trails whenever possible.  Spot-check yourself and others often for the presence of ticks on clothes. If you find one, there are likely to be others. Check thoroughly.  Remove clothes after leaving tick-infested areas. If possible, wash them to eliminate any unseen ticks. Check yourself, your children and any pets from head to toe for the presence of ticks.  Shower and shampoo. You can greatly reduce your chances of contracting RMSF if you remove attached ticks as soon as possible. Regular checks of the body, including all body sites covered by hair (head, armpits, genitals), allow removal of the tick before rickettsial transmission. To remove an attached tick, use a forceps or tweezers to detach the intact tick without leaving mouth parts in the skin. The tick bite wound should be cleansed after tick removal. Remember the most common symptoms of RMSF are fever, muscle aches, headache and nausea or vomiting with a later onset of rash. If you get these symptoms after a tick bite and while living in an area where RMSF is found, RMSF should be suspected. If the disease is not treated, it can  cause death. See your caregiver immediately if you get these symptoms. Do this even if not aware of a tick bite. Document Released: 11/04/2000 Document Revised: 10/15/2011 Document Reviewed: 06/27/2009 [facility] Patient Information 2014 [place], [place].

## 2014-01-04 NOTE — Progress Notes (Signed)
Pre-visit discussion using our clinic review tool. No additional management support is needed unless otherwise documented below in the visit note.  

## 2014-01-04 NOTE — Progress Notes (Signed)
Patient ID: Rebecca Cole, female   DOB: 12/10/1960, 53 y.o.   MRN: [id number]    Subjective:     Rebecca Cole is a 53 y.o. female and is here for a comprehensive physical exam. The patient reports problems - as follows.  History   Social History  . Marital Status: Single    Spouse Name: N/A    Number of Children: N/A  . Years of Education: N/A   Occupational History  . 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 Smoking status: Never Smoker   . Smokeless tobacco: Never Used  . Alcohol Use: Yes     Comment: moderate  . Drug Use: No  . Sexual Activity: Not on file   Other Topics Concern  . Not on file   Social History Narrative   Full-time air traffic controller, has [employer].   Has no pets   Health Maintenance  Topic Date Due  . Tetanus/tdap  06/29/1981  . Influenza Vaccine  03/06/2014  . Mammogram  12/23/2015  . Pap Smear  01/03/2016  . Colonoscopy  07/05/2022    The following portions of the patient's history were reviewed and updated as appropriate: allergies, current medications, past family history, past medical history, past social history, past surgical history and problem list.  Review of Systems A comprehensive review of systems was negative.   Objective:   BP 122/80  Pulse 67  Temp(Src) 98.7 F (37.1 C) (Oral)  Resp 16  Ht 5' 8.5" (1.74 m)  Wt 138 lb (62.596 kg)  BMI 20.68 kg/m2  SpO2 98%  LMP 12/29/2013  General appearance: alert, cooperative and appears stated age Ears: normal TM's and external ear canals both ears Throat: lips, mucosa, and tongue normal; teeth and gums normal Neck: no adenopathy, no carotid bruit, supple, symmetrical, trachea midline and thyroid not enlarged, symmetric, no tenderness/mass/nodules Back: symmetric, no curvature. ROM normal. No CVA tenderness. Lungs: clear to auscultation bilaterally Heart: regular rate and rhythm, S1, S2 normal, no murmur, click, rub or gallop Abdomen:  soft, non-tender; bowel sounds normal; no masses,  no organomegaly Pulses: 2+ and symmetric Skin: Skin color, texture, turgor normal. No rashes or lesions Lymph nodes: Cervical, supraclavicular, and axillary nodes normal.  .    Assessment and Plan:   Unspecified vitamin D deficiency Talking a supplement 1000 units twice weekly.  Current level is normal   Routine general medical examination at a health care facility Annual comprehensive exam was done, exncluding breast, pelvic and PAP smear, which are done by her gynecologist. All screenings have been addressed .   Fatigue She has no obvious cause other than possible dysthymia.   All screenig labs are normal and thyroid function is normal.    Hypothyroidism, acquired, autoimmune Thyroid function is WNL on current dose.  No current changes needed.   Screening for colon cancer Done in 2013  At age 11,  With Dr. Collene Mares follow up 10 yrs     Updated Medication List Outpatient Encounter Prescriptions as of 01/04/2014  Medication Sig  . Cholecalciferol (VITAMIN D3) 1000 UNITS CAPS Take 1 capsule by mouth daily.    Marland Kitchen levothyroxine (SYNTHROID, LEVOTHROID) 100 MCG tablet TAKE ONE TABLET BY MOUTH ONE TIME DAILY   . diclofenac (VOLTAREN) 50 MG EC tablet Take 50 mg by mouth 2 (two) times daily.    Marland Kitchen doxycycline (VIBRA-TABS) 100 MG tablet Take 1 tablet (100 mg total) by mouth 2 (two) times daily.  Marland Kitchen ketoconazole (  NIZORAL) 2 % cream Apply topically daily.  Marland Kitchen omeprazole (PRILOSEC) 20 MG capsule TAKE ONE CAPSULE BY MOUTH ONE TIME DAILY  . Selenium (SELENIMIN-200 PO) Take by mouth 2 (two) times daily.

## 2014-01-05 ENCOUNTER — Encounter: Payer: Self-pay | Admitting: Internal Medicine

## 2014-01-05 LAB — VITAMIN D 25 HYDROXY (VIT D DEFICIENCY, FRACTURES): VIT D 25 HYDROXY: 45 ng/mL (ref 30–89)

## 2014-01-05 LAB — FOLATE RBC: RBC Folate: 717 ng/mL (ref >280)

## 2014-01-05 NOTE — Assessment & Plan Note (Addendum)
Annual comprehensive exam was done, exncluding breast, pelvic and PAP smear, which are done by her gynecologist. All screenings have been addressed .

## 2014-01-05 NOTE — Assessment & Plan Note (Signed)
Thyroid function is WNL on current dose.  No current changes needed.  

## 2014-01-05 NOTE — Assessment & Plan Note (Signed)
She has no obvious cause other than possible dysthymia.   All screenig labs are normal and thyroid function is normal.

## 2014-01-05 NOTE — Assessment & Plan Note (Signed)
Done in 2013  At age 53,  With Dr. Collene Mares follow up 10 yrs

## 2014-01-06 ENCOUNTER — Encounter: Payer: Self-pay | Admitting: Internal Medicine

## 2014-05-12 ENCOUNTER — Other Ambulatory Visit: Payer: Self-pay | Admitting: Internal Medicine

## 2014-08-13 ENCOUNTER — Other Ambulatory Visit: Payer: Self-pay | Admitting: Internal Medicine

## 2014-11-08 ENCOUNTER — Other Ambulatory Visit: Payer: Self-pay

## 2014-11-08 DIAGNOSIS — Z1231 Encounter for screening mammogram for malignant neoplasm of breast: Secondary | ICD-10-CM

## 2015-01-04 ENCOUNTER — Ambulatory Visit: Payer: Federal, State, Local not specified - PPO

## 2015-01-05 ENCOUNTER — Ambulatory Visit (INDEPENDENT_AMBULATORY_CARE_PROVIDER_SITE_OTHER): Payer: Federal, State, Local not specified - PPO | Admitting: Nurse Practitioner

## 2015-01-05 VITALS — BP 102/68 | HR 86 | Temp 98.0°F | Resp 16 | Ht 68.5 in | Wt 139.1 lb

## 2015-01-05 DIAGNOSIS — H109 Unspecified conjunctivitis: Secondary | ICD-10-CM

## 2015-01-05 MED ORDER — POLYMYXIN B-TRIMETHOPRIM 10000-0.1 UNIT/ML-% OP SOLN: 1.0000 [drp] | OPHTHALMIC | Status: DC

## 2015-01-05 NOTE — Patient Instructions (Signed)
Pick up the Polytrim if you need it (goopy eyes again)  Lysol spray is helpful for air and surfaces- decontamination   Call us if not improving by Friday.

## 2015-01-05 NOTE — Progress Notes (Signed)
   Subjective:    Patient ID: Rebecca Cole, female    DOB: 05/27/1961, 55 y.o.   MRN: [id number]  HPI  Rebecca Cole is a 54 yo female with a CC of cough x 8 days and right eye discharge.   1) Urgent care in [place] Day 6 of [facility]  Watery and stinging, started yesterday 4-5 pm with yellow discharge that was stringy   Benadryl at night Sudafed Mucinex Delsym Tylenol and Advil Water intake increased  Hot Showers  Neti Pot    Review of Systems  Constitutional: Negative for fever, chills, diaphoresis and fatigue.  Eyes: Positive for discharge and redness. Negative for photophobia, pain, itching and visual disturbance.  Respiratory: Negative for chest tightness, shortness of breath and wheezing.   Cardiovascular: Negative for chest pain, palpitations and leg swelling.  Gastrointestinal: Negative for nausea, vomiting and diarrhea.  Skin: Negative for rash.  Neurological: Negative for dizziness, weakness, numbness and headaches.  Psychiatric/Behavioral: The patient is not nervous/anxious.       Objective:   Physical Exam  Constitutional: She is oriented to person, place, and time. She appears well-developed and well-nourished. No distress.  BP 102/68 mmHg  Pulse 86  Temp(Src) 98 F (36.7 C)  Resp 16  Ht 5' 8.5" (1.74 m)  Wt 139 lb 1.9 oz (63.104 kg)  BMI 20.84 kg/m2  SpO2 90%   HENT:  Head: Normocephalic and atraumatic.  Right Ear: External ear normal.  Left Ear: External ear normal.  Eyes: Pupils are equal, round, and reactive to light. Right eye exhibits no discharge and no exudate. Left eye exhibits no discharge. Right conjunctiva is injected. Right conjunctiva has no hemorrhage. Left conjunctiva is not injected. Left conjunctiva has no hemorrhage. No scleral icterus. Right eye exhibits normal extraocular motion and no nystagmus. Left eye exhibits normal extraocular motion and no nystagmus.  Cardiovascular: Normal rate, regular rhythm, normal heart sounds and intact distal  pulses.  Exam reveals no gallop and no friction rub.   No murmur heard. Pulmonary/Chest: Effort normal and breath sounds normal. No respiratory distress. She has no wheezes. She has no rales. She exhibits no tenderness.  Neurological: She is alert and oriented to person, place, and time. No cranial nerve deficit. She exhibits normal muscle tone. Coordination normal.  Skin: Skin is warm and dry. No rash noted. She is not diaphoretic.  Psychiatric: She has a normal mood and affect. Her behavior is normal. Judgment and thought content normal.      Assessment & Plan:

## 2015-01-10 ENCOUNTER — Ambulatory Visit
Admission: RE | Admit: 2015-01-10 | Discharge: 2015-01-10 | Disposition: A | Discharge status: Home / Self Care | Payer: Federal, State, Local not specified - PPO | Source: Ambulatory Visit

## 2015-01-10 DIAGNOSIS — Z1231 Encounter for screening mammogram for malignant neoplasm of breast: Secondary | ICD-10-CM

## 2015-01-11 ENCOUNTER — Ambulatory Visit (INDEPENDENT_AMBULATORY_CARE_PROVIDER_SITE_OTHER): Payer: Federal, State, Local not specified - PPO | Admitting: Internal Medicine

## 2015-01-11 ENCOUNTER — Encounter: Payer: Self-pay | Admitting: Internal Medicine

## 2015-01-11 VITALS — BP 112/78 | HR 68 | Temp 98.8°F | Resp 14 | Ht 68.25 in | Wt 140.5 lb

## 2015-01-11 DIAGNOSIS — E038 Other specified hypothyroidism: Secondary | ICD-10-CM

## 2015-01-11 DIAGNOSIS — Z0000 Encounter for general adult medical examination without abnormal findings: Secondary | ICD-10-CM

## 2015-01-11 DIAGNOSIS — J40 Bronchitis, not specified as acute or chronic: Secondary | ICD-10-CM

## 2015-01-11 DIAGNOSIS — E063 Autoimmune thyroiditis: Secondary | ICD-10-CM

## 2015-01-11 DIAGNOSIS — E034 Atrophy of thyroid (acquired): Secondary | ICD-10-CM

## 2015-01-11 DIAGNOSIS — M25362 Other instability, left knee: Secondary | ICD-10-CM

## 2015-01-11 DIAGNOSIS — M7652 Patellar tendinitis, left knee: Secondary | ICD-10-CM

## 2015-01-11 DIAGNOSIS — E785 Hyperlipidemia, unspecified: Secondary | ICD-10-CM | POA: Diagnosis not present

## 2015-01-11 DIAGNOSIS — J209 Acute bronchitis, unspecified: Secondary | ICD-10-CM

## 2015-01-11 DIAGNOSIS — R5383 Other fatigue: Secondary | ICD-10-CM

## 2015-01-11 LAB — CBC WITH DIFFERENTIAL/PLATELET
BASOS ABS: 0 10*3/uL (ref 0.0–0.1)
Basophils Relative: 0.7 % (ref 0.0–3.0)
Eosinophils Absolute: 0.1 10*3/uL (ref 0.0–0.7)
Eosinophils Relative: 1 % (ref 0.0–5.0)
HCT: 38.5 % (ref 36.0–46.0)
HEMOGLOBIN: 12.6 g/dL (ref 12.0–15.0)
LYMPHS PCT: 18.8 % (ref 12.0–46.0)
Lymphs Abs: 1.2 10*3/uL (ref 0.7–4.0)
MCHC: 32.6 g/dL (ref 30.0–36.0)
MCV: 92.1 fl (ref 78.0–100.0)
Monocytes Absolute: 0.4 10*3/uL (ref 0.1–1.0)
Monocytes Relative: 6.6 % (ref 3.0–12.0)
NEUTROS PCT: 72.9 % (ref 43.0–77.0)
Neutro Abs: 4.5 10*3/uL (ref 1.4–7.7)
Platelets: 615 10*3/uL — ABNORMAL HIGH (ref 150.0–400.0)
RBC: 4.18 Mil/uL (ref 3.87–5.11)
RDW: 14.1 % (ref 11.5–15.5)
WBC: 6.2 10*3/uL (ref 4.0–10.5)

## 2015-01-11 LAB — COMPREHENSIVE METABOLIC PANEL
ALT: 17 U/L (ref 0–35)
AST: 15 U/L (ref 0–37)
Albumin: 4.1 g/dL (ref 3.5–5.2)
Alkaline Phosphatase: 90 U/L (ref 39–117)
BUN: 11 mg/dL (ref 6–23)
CALCIUM: 9.5 mg/dL (ref 8.4–10.5)
CHLORIDE: 100 meq/L (ref 96–112)
CO2: 29 meq/L (ref 19–32)
CREATININE: 0.73 mg/dL (ref 0.40–1.20)
GFR: 88.79 mL/min (ref >60.00)
Glucose, Bld: 100 mg/dL — ABNORMAL HIGH (ref 70–99)
Potassium: 4.2 mEq/L (ref 3.5–5.1)
SODIUM: 133 meq/L — AB (ref 135–145)
TOTAL PROTEIN: 7.4 g/dL (ref 6.0–8.3)
Total Bilirubin: 0.4 mg/dL (ref 0.2–1.2)

## 2015-01-11 LAB — LDL CHOLESTEROL, DIRECT: Direct LDL: 132 mg/dL

## 2015-01-11 MED ORDER — PREDNISONE 10 MG PO TABS: ORAL_TABLET | ORAL | Status: DC

## 2015-01-11 MED ORDER — HYDROCOD POLST-CPM POLST ER 10-8 MG/5ML PO SUER: 5.0000 mL | Freq: Two times a day (BID) | ORAL | Status: DC | PRN

## 2015-01-11 MED ORDER — DOXYCYCLINE HYCLATE 100 MG PO TABS: 100.0000 mg | ORAL_TABLET | Freq: Two times a day (BID) | ORAL | Status: DC

## 2015-01-11 MED ORDER — AZITHROMYCIN 250 MG PO TABS: ORAL_TABLET | ORAL | Status: DC

## 2015-01-11 NOTE — Patient Instructions (Addendum)
I am treating you empirically for whooping cough based on your symptoms   Azithromycin 500 mg Day 1,  250 mg Days 2-5 Prednisone taper Tussionex cough syrup (has codeine in it so it will make you drowsy)  every 12 hours as needed for severe cough  Continue saline sinus flushes, sudafed, mucinex  and hot tea   No kayaking   Please continue your probiotic ( Align, Floraque, Flora jen  or Roslyn) for a minimum of 3 weeks to prevent a serious antibiotic associated diarrhea  Called clostridium dificile colitis  Rebecca Cole 10 day Smoothie Cleanse (something like that) or any other detox once you are feeling better.    Health Maintenance Adopting a healthy lifestyle and getting preventive care can go a long way to promote health and wellness. Talk with your health care provider about what schedule of regular examinations is right for you. This is a good chance for you to check in with your provider about disease prevention and staying healthy. In between checkups, there are plenty of things you can do on your own. Experts have done a lot of research about which lifestyle changes and preventive measures are most likely to keep you healthy. Ask your health care provider for more information. WEIGHT AND DIET  Eat a healthy diet  Be sure to include plenty of vegetables, fruits, low-fat dairy products, and lean protein.  Do not eat a lot of foods high in solid fats, added sugars, or salt.  Get regular exercise. This is one of the most important things you can do for your health.  Most adults should exercise for at least 150 minutes each week. The exercise should increase your heart rate and make you sweat (moderate-intensity exercise).  Most adults should also do strengthening exercises at least twice a week. This is in addition to the moderate-intensity exercise.  Maintain a healthy weight  Body mass index (BMI) is a measurement that can be used to identify possible weight problems. It  estimates body fat based on height and weight. Your health care provider can help determine your BMI and help you achieve or maintain a healthy weight.  For females 53 years of age and older:   A BMI below 18.5 is considered underweight.  A BMI of 18.5 to 24.9 is normal.  A BMI of 25 to 29.9 is considered overweight.  A BMI of 30 and above is considered obese.  Watch levels of cholesterol and blood lipids  You should start having your blood tested for lipids and cholesterol at 53 years of age, then have this test every 5 years.  You may need to have your cholesterol levels checked more often if:  Your lipid or cholesterol levels are high.  You are older than 53 years of age.  You are at high risk for heart disease.  CANCER SCREENING   Lung Cancer  Lung cancer screening is recommended for adults 53-50 years old who are at high risk for lung cancer because of a history of smoking.  A yearly low-dose CT scan of the lungs is recommended for people who:  Currently smoke.  Have quit within the past 15 years.  Have at least a 30-pack-year history of smoking. A pack year is smoking an average of one pack of cigarettes a day for 1 year.  Yearly screening should continue until it has been 15 years since you quit.  Yearly screening should stop if you develop a health problem that would prevent you from having  lung cancer treatment.  Breast Cancer  Practice breast self-awareness. This means understanding how your breasts normally appear and feel.  It also means doing regular breast self-exams. Let your health care provider know about any changes, no matter how small.  If you are in your 53s or 30s, you should have a clinical breast exam (CBE) by a health care provider every 1-3 years as part of a regular health exam.  If you are 53 or older, have a CBE every year. Also consider having a breast X-ray (mammogram) every year.  If you have a family history of breast cancer, talk  to your health care provider about genetic screening.  If you are at high risk for breast cancer, talk to your health care provider about having an MRI and a mammogram every year.  Breast cancer gene (BRCA) assessment is recommended for women who have family members with BRCA-related cancers. BRCA-related cancers include:  Breast.  Ovarian.  Tubal.  Peritoneal cancers.  Results of the assessment will determine the need for genetic counseling and BRCA1 and BRCA2 testing. Cervical Cancer Routine pelvic examinations to screen for cervical cancer are no longer recommended for nonpregnant women who are considered low risk for cancer of the pelvic organs (ovaries, uterus, and vagina) and who do not have symptoms. A pelvic examination may be necessary if you have symptoms including those associated with pelvic infections. Ask your health care provider if a screening pelvic exam is right for you.   The Pap test is the screening test for cervical cancer for women who are considered at risk.  If you had a hysterectomy for a problem that was not cancer or a condition that could lead to cancer, then you no longer need Pap tests.  If you are older than 53 years, and you have had normal Pap tests for the past 10 years, you no longer need to have Pap tests.  If you have had past treatment for cervical cancer or a condition that could lead to cancer, you need Pap tests and screening for cancer for at least 20 years after your treatment.  If you no longer get a Pap test, assess your risk factors if they change (such as having a new sexual partner). This can affect whether you should start being screened again.  Some women have medical problems that increase their chance of getting cervical cancer. If this is the case for you, your health care provider may recommend more frequent screening and Pap tests.  The human papillomavirus (HPV) test is another test that may be used for cervical cancer screening.  The HPV test looks for the virus that can cause cell changes in the cervix. The cells collected during the Pap test can be tested for HPV.  The HPV test can be used to screen women 53 years of age and older. Getting tested for HPV can extend the interval between normal Pap tests from three to five years.  An HPV test also should be used to screen women of any age who have unclear Pap test results.  After 53 years of age, women should have HPV testing as often as Pap tests.  Colorectal Cancer  This type of cancer can be detected and often prevented.  Routine colorectal cancer screening usually begins at 53 years of age and continues through 53 years of age.  Your health care provider may recommend screening at an earlier age if you have risk factors for colon cancer.  Your health care provider  may also recommend using home test kits to check for hidden blood in the stool.  A small camera at the end of a tube can be used to examine your colon directly (sigmoidoscopy or colonoscopy). This is done to check for the earliest forms of colorectal cancer.  Routine screening usually begins at age 37.  Direct examination of the colon should be repeated every 5-10 years through 53 years of age. However, you may need to be screened more often if early forms of precancerous polyps or small growths are found. Skin Cancer  Check your skin from head to toe regularly.  Tell your health care provider about any new moles or changes in moles, especially if there is a change in a mole's shape or color.  Also tell your health care provider if you have a mole that is larger than the size of a pencil eraser.  Always use sunscreen. Apply sunscreen liberally and repeatedly throughout the day.  Protect yourself by wearing long sleeves, pants, a wide-brimmed hat, and sunglasses whenever you are outside. HEART DISEASE, DIABETES, AND HIGH BLOOD PRESSURE   Have your blood pressure checked at least every 1-2  years. High blood pressure causes heart disease and increases the risk of stroke.  If you are between 66 years and 26 years old, ask your health care provider if you should take aspirin to prevent strokes.  Have regular diabetes screenings. This involves taking a blood sample to check your fasting blood sugar level.  If you are at a normal weight and have a low risk for diabetes, have this test once every three years after 53 years of age.  If you are overweight and have a high risk for diabetes, consider being tested at a younger age or more often. PREVENTING INFECTION  Hepatitis B  If you have a higher risk for hepatitis B, you should be screened for this virus. You are considered at high risk for hepatitis B if:  You were born in a country where hepatitis B is common. Ask your health care provider which countries are considered high risk.  Your parents were born in a high-risk country, and you have not been immunized against hepatitis B (hepatitis B vaccine).  You have HIV or AIDS.  You use needles to inject street drugs.  You live with someone who has hepatitis B.  You have had sex with someone who has hepatitis B.  You get hemodialysis treatment.  You take certain medicines for conditions, including cancer, organ transplantation, and autoimmune conditions. Hepatitis C  Blood testing is recommended for:  Everyone born from [birth year] through 1964.  Anyone with known risk factors for hepatitis C. Sexually transmitted infections (STIs)  You should be screened for sexually transmitted infections (STIs) including gonorrhea and chlamydia if:  You are sexually active and are younger than 53 years of age.  You are older than 53 years of age and your health care provider tells you that you are at risk for this type of infection.  Your sexual activity has changed since you were last screened and you are at an increased risk for chlamydia or gonorrhea. Ask your health care provider if  you are at risk.  If you do not have HIV, but are at risk, it may be recommended that you take a prescription medicine daily to prevent HIV infection. This is called pre-exposure prophylaxis (PrEP). You are considered at risk if:  You are sexually active and do not regularly use condoms or know the  HIV status of your partner(s).  You take drugs by injection.  You are sexually active with a partner who has HIV. Talk with your health care provider about whether you are at high risk of being infected with HIV. If you choose to begin PrEP, you should first be tested for HIV. You should then be tested every 3 months for as long as you are taking PrEP.  PREGNANCY   If you are premenopausal and you may become pregnant, ask your health care provider about preconception counseling.  If you may become pregnant, take 400 to 800 micrograms (mcg) of folic acid every day.  If you want to prevent pregnancy, talk to your health care provider about birth control (contraception). OSTEOPOROSIS AND MENOPAUSE   Osteoporosis is a disease in which the bones lose minerals and strength with aging. This can result in serious bone fractures. Your risk for osteoporosis can be identified using a bone density scan.  If you are 31 years of age or older, or if you are at risk for osteoporosis and fractures, ask your health care provider if you should be screened.  Ask your health care provider whether you should take a calcium or vitamin D supplement to lower your risk for osteoporosis.  Menopause may have certain physical symptoms and risks.  Hormone replacement therapy may reduce some of these symptoms and risks. Talk to your health care provider about whether hormone replacement therapy is right for you.  HOME CARE INSTRUCTIONS   Schedule regular health, dental, and eye exams.  Stay current with your immunizations.   Do not use any tobacco products including cigarettes, chewing tobacco, or electronic  cigarettes.  If you are pregnant, do not drink alcohol.  If you are breastfeeding, limit how much and how often you drink alcohol.  Limit alcohol intake to no more than 1 drink per day for nonpregnant women. One drink equals 12 ounces of beer, 5 ounces of wine, or 1 ounces of hard liquor.  Do not use street drugs.  Do not share needles.  Ask your health care provider for help if you need support or information about quitting drugs.  Tell your health care provider if you often feel depressed.  Tell your health care provider if you have ever been abused or do not feel safe at home. Document Released: 02/05/2011 Document Revised: 12/07/2013 Document Reviewed: 06/24/2013 [facility] Patient Information 2015 [place], [place]. This information is not intended to replace advice given to you by your health care provider. Make sure you discuss any questions you have with your health care provider.

## 2015-01-11 NOTE — Progress Notes (Signed)
Patient ID: Rebecca Cole, female    DOB: 25-May-1961  Age: 54 y.o. MRN: [id number]  The patient is here for annua wellness examination and management of other chronic and acute problems. Left patellar instability .  Recovering for strep throat    The risk factors are reflected in the social history.  The roster of all physicians providing medical care to patient - is listed in the Snapshot section of the chart.  Activities of daily living:  The patient is 100% independent in all ADLs: dressing, toileting, feeding as well as independent mobility  Home safety : The patient has smoke detectors in the home. They wear seatbelts.  There are no firearms at home. There is no violence in the home.   There is no risks for hepatitis, STDs or HIV. There is no   history of blood transfusion. They have no travel history to infectious disease endemic areas of the world.  The patient has seen their dentist in the last six month. They have seen their eye doctor in the last year. They admit to slight hearing difficulty with regard to whispered voices and some television programs.  They have deferred audiologic testing in the last year.  They do not  have excessive sun exposure. Discussed the need for sun protection: hats, long sleeves and use of sunscreen if there is significant sun exposure.   Diet: the importance of a healthy diet is discussed. They do have a healthy diet.  The benefits of regular aerobic exercise were discussed. She walks 4 times per week ,  20 minutes.   Depression screen: there are no signs or vegative symptoms of depression- irritability, change in appetite, anhedonia, sadness/tearfullness.  Cognitive assessment: the patient manages all their financial and personal affairs and is actively engaged. They could relate day,date,year and events; recalled 2/3 objects at 3 minutes; performed clock-face test normally.  The following portions of the patient's history were reviewed and updated as  appropriate: allergies, current medications, past family history, past medical history,  past surgical history, past social history  and problem list.  Visual acuity was not assessed per patient preference since she has regular follow up with her ophthalmologist. Hearing and body mass index were assessed and reviewed.   During the course of the visit the patient was educated and counseled about appropriate screening and preventive services including : fall prevention , diabetes screening, nutrition counseling, colorectal cancer screening, and recommended immunizations.    CC: The primary encounter diagnosis was Routine general medical examination at a health care facility. Diagnoses of Acute bronchitis, unspecified organism, Hypothyroidism due to acquired atrophy of thyroid, Other fatigue, Hyperlipidemia, Patellar instability of left knee, Hypothyroidism, acquired, autoimmune, Bronchitis with tracheitis, and Patellar tendinitis, left were also pertinent to this visit.  History Noura has a past medical history of Graves' orbitopathy (2010) and Graves Disease (Sept 2010).   She has past surgical history that includes Breast biopsy (2006).   Her family history includes Cancer in her father and maternal grandmother; Heart disease in her father.She reports that she has never smoked. She has never used smokeless tobacco. She reports that she drinks alcohol. She reports that she does not use illicit drugs.  Outpatient Prescriptions Prior to Visit  Medication Sig Dispense Refill  . Cholecalciferol (VITAMIN D3) 1000 UNITS CAPS Take 1 capsule by mouth daily.      Marland Kitchen ketoconazole (NIZORAL) 2 % cream Apply topically daily. 15 g 2  . levothyroxine (SYNTHROID, LEVOTHROID) 100 MCG tablet TAKE ONE TABLET  BY MOUTH ONE TIME DAILY  90 tablet 1  . Selenium (SELENIMIN-200 PO) Take by mouth 2 (two) times daily.    . diclofenac (VOLTAREN) 50 MG EC tablet Take 50 mg by mouth 2 (two) times daily.      Marland Kitchen omeprazole  (PRILOSEC) 20 MG capsule TAKE ONE CAPSULE BY MOUTH ONE TIME DAILY (Patient not taking: Reported on 01/05/2015) 30 capsule 2  . trimethoprim-polymyxin b (POLYTRIM) ophthalmic solution Place 1 drop into the right eye every 4 (four) hours. 10 mL 0   No facility-administered medications prior to visit.    Review of Systems   Patient denies headache, fevers, malaise, unintentional weight loss, skin rash, eye pain, sinus congestion and sinus pain, sore throat, dysphagia,  hemoptysis , dyspnea, wheezing, chest pain, palpitations, orthopnea, edema, abdominal pain, nausea, melena, diarrhea, constipation, flank pain, dysuria, hematuria, urinary  Frequency, nocturia, numbness, tingling, seizures,  Focal weakness, Loss of consciousness,  Tremor, insomnia, depression, anxiety, and suicidal ideation.      Objective:  BP 112/78 mmHg  Pulse 68  Temp(Src) 98.8 F (37.1 C) (Oral)  Resp 14  Ht 5' 8.25" (1.734 m)  Wt 140 lb 8 oz (63.73 kg)  BMI 21.20 kg/m2  SpO2 98%  LMP 12/22/2014  Physical Exam   General appearance: alert, cooperative and appears stated age Ears: normal TM's and external ear canals both ears Throat: lips, mucosa, and tongue normal; teeth and gums normal Neck: no adenopathy, no carotid bruit, supple, symmetrical, trachea midline and thyroid not enlarged, symmetric, no tenderness/mass/nodules Back: symmetric, no curvature. ROM normal. No CVA tenderness. Lungs: clear to auscultation bilaterally Heart: regular rate and rhythm, S1, S2 normal, no murmur, click, rub or gallop Abdomen: soft, non-tender; bowel sounds normal; no masses,  no organomegaly Pulses: 2+ and symmetric Skin: Skin color, texture, turgor normal. No rashes or lesions Lymph nodes: Cervical, supraclavicular, and axillary nodes normal.    Assessment & Plan:   Problem List Items Addressed This Visit    Hypothyroidism, acquired, autoimmune    Thyroid function is WNL on current dose.  No current changes needed.        Routine general medical examination at a health care facility - Primary    Annual comprehensive exam was done including breast, pelvic and PAP smear. All screenings have been addressed .       Bronchitis with tracheitis    Treating empirically for Pertussis with azithromycin, prednisone taper and cough suppressant.       Patellar tendinitis    Chronic with history of  ligament laxiity.  Refer to sports medicine for evaluation       Fatigue   Relevant Orders   Comp Met (CMET) (Completed)   CBC with Differential/Platelet (Completed)    Other Visit Diagnoses    Acute bronchitis, unspecified organism        Relevant Orders    Bordetella pertussis Ab IgG, IgA    Hypothyroidism due to acquired atrophy of thyroid        Relevant Orders    T4 AND TSH (Completed)    Hyperlipidemia        Relevant Orders    LDL cholesterol, direct (Completed)    Patellar instability of left knee        Relevant Orders    Ambulatory referral to Sports Medicine       I have discontinued Ms. Fanning's diclofenac, omeprazole, and trimethoprim-polymyxin b. I am also having her start on azithromycin, predniSONE, chlorpheniramine-HYDROcodone, and doxycycline. Additionally, I am  having her maintain her Vitamin D3, Selenium (SELENIMIN-200 PO), ketoconazole, and levothyroxine.  Meds ordered this encounter  Medications  . azithromycin (ZITHROMAX) 250 MG tablet    Sig: 2 tablets on day 1,  1 tablet daily thereafter    Dispense:  6 tablet    Refill:  0  . predniSONE (DELTASONE) 10 MG tablet    Sig: 6 tablets on Day 1 , then reduce by 1 tablet daily until gone    Dispense:  21 tablet    Refill:  0  . chlorpheniramine-HYDROcodone (TUSSIONEX PENNKINETIC ER) 10-8 MG/5ML SUER    Sig: Take 5 mLs by mouth every 12 (twelve) hours as needed for cough.    Dispense:  180 mL    Refill:  0  . doxycycline (VIBRA-TABS) 100 MG tablet    Sig: Take 1 tablet (100 mg total) by mouth 2 (two) times daily.    Dispense:  14  tablet    Refill:  0    Medications Discontinued During This Encounter  Medication Reason  . diclofenac (VOLTAREN) 50 MG EC tablet Patient Preference  . omeprazole (PRILOSEC) 20 MG capsule Patient Preference  . trimethoprim-polymyxin b (POLYTRIM) ophthalmic solution Patient Preference    Follow-up: No Follow-up on file.   [name], MD

## 2015-01-12 ENCOUNTER — Encounter: Payer: Self-pay | Admitting: Internal Medicine

## 2015-01-12 DIAGNOSIS — M7650 Patellar tendinitis, unspecified knee: Secondary | ICD-10-CM | POA: Insufficient documentation

## 2015-01-12 DIAGNOSIS — J40 Bronchitis, not specified as acute or chronic: Secondary | ICD-10-CM | POA: Insufficient documentation

## 2015-01-12 LAB — T4 AND TSH
T4 TOTAL: 11.4 ug/dL (ref 4.5–12.0)
TSH: 3.14 u[IU]/mL (ref 0.450–4.500)

## 2015-01-12 NOTE — Assessment & Plan Note (Signed)
Thyroid function is WNL on current dose.  No current changes needed.  

## 2015-01-12 NOTE — Assessment & Plan Note (Signed)
Treating empirically for Pertussis with azithromycin, prednisone taper and cough suppressant.

## 2015-01-12 NOTE — Assessment & Plan Note (Signed)
Chronic with history of  ligament laxiity.  Refer to sports medicine for evaluation

## 2015-01-12 NOTE — Assessment & Plan Note (Signed)
Annual comprehensive exam was done including breast, pelvic and PAP smear. All screenings have been addressed .  

## 2015-01-14 ENCOUNTER — Other Ambulatory Visit: Payer: Self-pay | Admitting: Internal Medicine

## 2015-01-14 DIAGNOSIS — D473 Essential (hemorrhagic) thrombocythemia: Secondary | ICD-10-CM

## 2015-01-14 DIAGNOSIS — D75839 Thrombocytosis, unspecified: Secondary | ICD-10-CM

## 2015-01-14 DIAGNOSIS — E038 Other specified hypothyroidism: Secondary | ICD-10-CM

## 2015-01-14 MED ORDER — LEVOTHYROXINE SODIUM 112 MCG PO TABS
112.0000 ug | ORAL_TABLET | Freq: Every day | ORAL | Status: DC
Start: 2015-01-14 — End: 2015-04-15

## 2015-01-17 LAB — BORDETELLA PERTUSSIS AB IGG,IGA
FHA IgA: 3 IU/mL
FHA IgG: 26 IU/mL
PT IgA: <1 IU/mL
PT IgG: 5 IU/mL

## 2015-01-18 ENCOUNTER — Encounter: Payer: Self-pay | Admitting: Internal Medicine

## 2015-01-22 ENCOUNTER — Encounter: Payer: Self-pay | Admitting: Nurse Practitioner

## 2015-01-22 DIAGNOSIS — H109 Unspecified conjunctivitis: Secondary | ICD-10-CM | POA: Insufficient documentation

## 2015-01-22 NOTE — Assessment & Plan Note (Signed)
Patient was given Polytrim eye drops to start on if worsenes, persisits. We discussed allergic, viral, and bacterial causes and treatment differences. Believe this is viral, could be bacterial if worsens. FU prn worsening/failure to improve.

## 2015-02-08 ENCOUNTER — Ambulatory Visit: Payer: Federal, State, Local not specified - PPO | Admitting: Family Medicine

## 2015-02-15 ENCOUNTER — Ambulatory Visit: Payer: Federal, State, Local not specified - PPO | Admitting: Family Medicine

## 2015-04-15 ENCOUNTER — Other Ambulatory Visit: Payer: Self-pay | Admitting: Internal Medicine

## 2015-04-15 ENCOUNTER — Other Ambulatory Visit (INDEPENDENT_AMBULATORY_CARE_PROVIDER_SITE_OTHER): Payer: Federal, State, Local not specified - PPO

## 2015-04-15 DIAGNOSIS — E038 Other specified hypothyroidism: Secondary | ICD-10-CM

## 2015-04-15 DIAGNOSIS — D473 Essential (hemorrhagic) thrombocythemia: Secondary | ICD-10-CM

## 2015-04-15 DIAGNOSIS — D75839 Thrombocytosis, unspecified: Secondary | ICD-10-CM

## 2015-04-15 LAB — CBC WITH DIFFERENTIAL/PLATELET
BASOS PCT: 0.6 % (ref 0.0–3.0)
Basophils Absolute: 0 10*3/uL (ref 0.0–0.1)
Eosinophils Absolute: 0.1 10*3/uL (ref 0.0–0.7)
Eosinophils Relative: 1.9 % (ref 0.0–5.0)
HCT: 37.2 % (ref 36.0–46.0)
Hemoglobin: 12.4 g/dL (ref 12.0–15.0)
Lymphocytes Relative: 25.1 % (ref 12.0–46.0)
Lymphs Abs: 1.1 10*3/uL (ref 0.7–4.0)
MCHC: 33.2 g/dL (ref 30.0–36.0)
MCV: 91.6 fl (ref 78.0–100.0)
MONO ABS: 0.3 10*3/uL (ref 0.1–1.0)
Monocytes Relative: 6 % (ref 3.0–12.0)
NEUTROS ABS: 2.8 10*3/uL (ref 1.4–7.7)
NEUTROS PCT: 66.4 % (ref 43.0–77.0)
PLATELETS: 293 10*3/uL (ref 150.0–400.0)
RBC: 4.07 Mil/uL (ref 3.87–5.11)
RDW: 14.3 % (ref 11.5–15.5)
WBC: 4.2 10*3/uL (ref 4.0–10.5)

## 2015-04-16 LAB — T4 AND TSH
T4 TOTAL: 10.7 ug/dL (ref 4.5–12.0)
TSH: 0.105 u[IU]/mL — AB (ref 0.450–4.500)

## 2015-04-17 ENCOUNTER — Encounter: Payer: Self-pay | Admitting: Internal Medicine

## 2015-04-17 ENCOUNTER — Other Ambulatory Visit: Payer: Self-pay | Admitting: Internal Medicine

## 2015-04-17 MED ORDER — LEVOTHYROXINE SODIUM 100 MCG PO TABS: 100.0000 ug | ORAL_TABLET | Freq: Every day | ORAL | Status: DC

## 2015-04-18 ENCOUNTER — Other Ambulatory Visit: Payer: Self-pay | Admitting: Internal Medicine

## 2015-06-10 ENCOUNTER — Telehealth: Payer: Self-pay | Admitting: Internal Medicine

## 2015-06-10 DIAGNOSIS — E785 Hyperlipidemia, unspecified: Secondary | ICD-10-CM

## 2015-06-10 DIAGNOSIS — R5383 Other fatigue: Secondary | ICD-10-CM

## 2015-06-10 DIAGNOSIS — E063 Autoimmune thyroiditis: Secondary | ICD-10-CM

## 2015-06-10 NOTE — Telephone Encounter (Signed)
Pt called about needing blood work for the TSH. Need order please and thank YOu!

## 2015-06-13 NOTE — Telephone Encounter (Signed)
Orders placed, can call and schedule now.

## 2015-06-13 NOTE — Telephone Encounter (Signed)
Patient had a TSH drawn on 04/15/15, Please advise if another one needs to be done?

## 2015-06-13 NOTE — Telephone Encounter (Signed)
Fasting labs as well as the thyroid recheck labs ordered

## 2015-06-20 ENCOUNTER — Other Ambulatory Visit (INDEPENDENT_AMBULATORY_CARE_PROVIDER_SITE_OTHER): Payer: Federal, State, Local not specified - PPO

## 2015-06-20 DIAGNOSIS — E038 Other specified hypothyroidism: Secondary | ICD-10-CM

## 2015-06-20 DIAGNOSIS — E785 Hyperlipidemia, unspecified: Secondary | ICD-10-CM | POA: Diagnosis not present

## 2015-06-20 DIAGNOSIS — E063 Autoimmune thyroiditis: Secondary | ICD-10-CM

## 2015-06-20 DIAGNOSIS — R5383 Other fatigue: Secondary | ICD-10-CM

## 2015-06-20 LAB — LIPID PANEL
CHOL/HDL RATIO: 3
Cholesterol: 194 mg/dL (ref 0–200)
HDL: 66.4 mg/dL (ref >39.00)
LDL Cholesterol: 115 mg/dL — ABNORMAL HIGH (ref 0–99)
NONHDL: 127.71
Triglycerides: 65 mg/dL (ref 0.0–149.0)
VLDL: 13 mg/dL (ref 0.0–40.0)

## 2015-06-20 LAB — COMPREHENSIVE METABOLIC PANEL
ALK PHOS: 64 U/L (ref 39–117)
ALT: 7 U/L (ref 0–35)
AST: 12 U/L (ref 0–37)
Albumin: 4 g/dL (ref 3.5–5.2)
BUN: 9 mg/dL (ref 6–23)
CO2: 28 meq/L (ref 19–32)
Calcium: 8.9 mg/dL (ref 8.4–10.5)
Chloride: 106 mEq/L (ref 96–112)
Creatinine, Ser: 0.75 mg/dL (ref 0.40–1.20)
GFR: 85.92 mL/min (ref >60.00)
Glucose, Bld: 104 mg/dL — ABNORMAL HIGH (ref 70–99)
POTASSIUM: 4.1 meq/L (ref 3.5–5.1)
SODIUM: 139 meq/L (ref 135–145)
TOTAL PROTEIN: 6.3 g/dL (ref 6.0–8.3)
Total Bilirubin: 0.7 mg/dL (ref 0.2–1.2)

## 2015-06-21 ENCOUNTER — Encounter: Payer: Self-pay | Admitting: Internal Medicine

## 2015-06-21 DIAGNOSIS — E034 Atrophy of thyroid (acquired): Secondary | ICD-10-CM

## 2015-06-21 LAB — T4 AND TSH
T4 TOTAL: 9.5 ug/dL (ref 4.5–12.0)
TSH: 0.137 u[IU]/mL — AB (ref 0.450–4.500)

## 2015-06-22 MED ORDER — LEVOTHYROXINE SODIUM 100 MCG PO TABS: 100.0000 ug | ORAL_TABLET | Freq: Every day | ORAL | Status: DC

## 2015-08-09 ENCOUNTER — Other Ambulatory Visit (INDEPENDENT_AMBULATORY_CARE_PROVIDER_SITE_OTHER): Payer: Federal, State, Local not specified - PPO

## 2015-08-09 DIAGNOSIS — E034 Atrophy of thyroid (acquired): Secondary | ICD-10-CM | POA: Diagnosis not present

## 2015-08-09 DIAGNOSIS — E038 Other specified hypothyroidism: Secondary | ICD-10-CM

## 2015-08-10 LAB — T4 AND TSH
T4 TOTAL: 9.3 ug/dL (ref 4.5–12.0)
TSH: 1.21 u[IU]/mL (ref 0.450–4.500)

## 2015-08-11 ENCOUNTER — Encounter: Payer: Self-pay | Admitting: Internal Medicine

## 2015-08-11 DIAGNOSIS — E039 Hypothyroidism, unspecified: Secondary | ICD-10-CM

## 2015-10-21 ENCOUNTER — Other Ambulatory Visit: Payer: Self-pay

## 2015-10-21 DIAGNOSIS — Z1231 Encounter for screening mammogram for malignant neoplasm of breast: Secondary | ICD-10-CM

## 2015-11-01 ENCOUNTER — Other Ambulatory Visit (INDEPENDENT_AMBULATORY_CARE_PROVIDER_SITE_OTHER): Payer: Federal, State, Local not specified - PPO

## 2015-11-01 DIAGNOSIS — E039 Hypothyroidism, unspecified: Secondary | ICD-10-CM

## 2015-11-02 LAB — T4 AND TSH
T4 TOTAL: 10.3 ug/dL (ref 4.5–12.0)
TSH: 0.178 u[IU]/mL — ABNORMAL LOW (ref 0.450–4.500)

## 2015-11-03 ENCOUNTER — Encounter: Payer: Self-pay | Admitting: Internal Medicine

## 2015-11-03 ENCOUNTER — Other Ambulatory Visit: Payer: Self-pay | Admitting: Internal Medicine

## 2015-11-03 ENCOUNTER — Other Ambulatory Visit: Payer: Federal, State, Local not specified - PPO

## 2015-11-03 DIAGNOSIS — E038 Other specified hypothyroidism: Secondary | ICD-10-CM

## 2015-11-29 DIAGNOSIS — K080 Exfoliation of teeth due to systemic causes: Secondary | ICD-10-CM | POA: Diagnosis not present

## 2015-12-02 ENCOUNTER — Other Ambulatory Visit (INDEPENDENT_AMBULATORY_CARE_PROVIDER_SITE_OTHER): Payer: Federal, State, Local not specified - PPO

## 2015-12-02 DIAGNOSIS — E038 Other specified hypothyroidism: Secondary | ICD-10-CM | POA: Diagnosis not present

## 2015-12-02 DIAGNOSIS — L700 Acne vulgaris: Secondary | ICD-10-CM | POA: Diagnosis not present

## 2015-12-03 LAB — T4 AND TSH
T4 TOTAL: 10.6 ug/dL (ref 4.5–12.0)
TSH: 0.239 u[IU]/mL — AB (ref 0.450–4.500)

## 2015-12-04 ENCOUNTER — Other Ambulatory Visit: Payer: Self-pay | Admitting: Internal Medicine

## 2015-12-04 ENCOUNTER — Encounter: Payer: Self-pay | Admitting: Internal Medicine

## 2015-12-04 DIAGNOSIS — E063 Autoimmune thyroiditis: Secondary | ICD-10-CM

## 2016-01-05 ENCOUNTER — Other Ambulatory Visit (INDEPENDENT_AMBULATORY_CARE_PROVIDER_SITE_OTHER): Payer: Federal, State, Local not specified - PPO

## 2016-01-05 DIAGNOSIS — E038 Other specified hypothyroidism: Secondary | ICD-10-CM | POA: Diagnosis not present

## 2016-01-05 DIAGNOSIS — E063 Autoimmune thyroiditis: Secondary | ICD-10-CM

## 2016-01-06 ENCOUNTER — Encounter: Payer: Self-pay | Admitting: Internal Medicine

## 2016-01-06 LAB — T4 AND TSH
T4 TOTAL: 10.2 ug/dL (ref 4.5–12.0)
TSH: 1.92 u[IU]/mL (ref 0.450–4.500)

## 2016-01-11 ENCOUNTER — Ambulatory Visit
Admission: RE | Admit: 2016-01-11 | Discharge: 2016-01-11 | Disposition: A | Discharge status: Home / Self Care | Payer: Federal, State, Local not specified - PPO | Source: Ambulatory Visit

## 2016-01-11 DIAGNOSIS — Z1231 Encounter for screening mammogram for malignant neoplasm of breast: Secondary | ICD-10-CM | POA: Diagnosis not present

## 2016-01-12 ENCOUNTER — Other Ambulatory Visit: Payer: Self-pay | Admitting: Internal Medicine

## 2016-01-12 DIAGNOSIS — Z6820 Body mass index (BMI) 20.0-20.9, adult: Secondary | ICD-10-CM | POA: Diagnosis not present

## 2016-01-12 DIAGNOSIS — Z01419 Encounter for gynecological examination (general) (routine) without abnormal findings: Secondary | ICD-10-CM | POA: Diagnosis not present

## 2016-01-13 ENCOUNTER — Encounter: Payer: Self-pay | Admitting: Internal Medicine

## 2016-01-13 ENCOUNTER — Ambulatory Visit (INDEPENDENT_AMBULATORY_CARE_PROVIDER_SITE_OTHER): Payer: Federal, State, Local not specified - PPO | Admitting: Internal Medicine

## 2016-01-13 VITALS — BP 102/64 | HR 67 | Temp 98.7°F | Resp 12 | Ht 67.75 in | Wt 135.5 lb

## 2016-01-13 DIAGNOSIS — Z0000 Encounter for general adult medical examination without abnormal findings: Secondary | ICD-10-CM | POA: Diagnosis not present

## 2016-01-13 DIAGNOSIS — E034 Atrophy of thyroid (acquired): Secondary | ICD-10-CM

## 2016-01-13 DIAGNOSIS — E890 Postprocedural hypothyroidism: Secondary | ICD-10-CM | POA: Diagnosis not present

## 2016-01-13 DIAGNOSIS — E038 Other specified hypothyroidism: Secondary | ICD-10-CM | POA: Diagnosis not present

## 2016-01-13 DIAGNOSIS — E063 Autoimmune thyroiditis: Secondary | ICD-10-CM

## 2016-01-13 MED ORDER — LEVOTHYROXINE SODIUM 112 MCG PO TABS: 112.0000 ug | ORAL_TABLET | Freq: Every day | ORAL | Status: DC

## 2016-01-13 MED ORDER — LEVOTHYROXINE SODIUM 100 MCG PO TABS: 100.0000 ug | ORAL_TABLET | Freq: Every day | ORAL | Status: DC

## 2016-01-13 MED ORDER — DOXYCYCLINE HYCLATE 100 MG PO TABS: 100.0000 mg | ORAL_TABLET | Freq: Two times a day (BID) | ORAL | Status: DC

## 2016-01-13 MED ORDER — KETOCONAZOLE 2 % EX CREA: 1.0000 "application " | TOPICAL_CREAM | Freq: Every day | CUTANEOUS | Status: DC

## 2016-01-13 NOTE — Progress Notes (Signed)
Pre-visit discussion using our clinic review tool. No additional management support is needed unless otherwise documented below in the visit note.  

## 2016-01-13 NOTE — Progress Notes (Signed)
Patient ID: Rebecca Cole, female    DOB: [birth year]  Age: 55 y.o. MRN: [id number]  The patient is here for annual non gyn  wellness examination and management of other chronic and acute problems.  PAP pelvic and breast/mammogram done yesterday by [facility]  Surgery is planned for July 24th now that her thyroid level is normal Colonoscopy is due in 2018 for FH of polyps  Monthly menses still occurring regularly now that thyroid function ahas normalized.   No hot flashes  Or perimenopausal symptoms   The risk factors are reflected in the so cial history.  The roster of all physicians providing medical care to patient - is listed in the Snapshot section of the chart. Home safety : The patient has smoke detectors in the home. They wear seatbelts.  There are no firearms at home. There is no violence in the home.   There is no risks for hepatitis, STDs or HIV. There is no   history of blood transfusion. They have no travel history to infectious disease endemic areas of the world.  The patient has seen their dentist in the last six month. They have seen their eye doctor in the last year.    They do not  have excessive sun exposure. Discussed the need for sun protection: hats, long sleeves and use of sunscreen if there is significant sun exposure.   Diet: the importance of a healthy diet is discussed. They do have a healthy diet.  The benefits of regular aerobic exercise were discussed. She exercises 5 times per week one hour per session.  Depression screen: there are no signs or vegative symptoms of depression- irritability, change in appetite, anhedonia, sadness/tearfullness.   The following portions of the patient's history were reviewed and updated as appropriate: allergies, current medications, past family history, past medical history,  past surgical history, past social history  and problem list.  Visual acuity was not assessed per patient preference since she has regular  follow up with her ophthalmologist. Hearing and body mass index were assessed and reviewed.   During the course of the visit the patient was educated and counseled about appropriate screening and preventive services including : fall prevention , diabetes screening, nutrition counseling, colorectal cancer screening, and recommended immunizations.    CC: The primary encounter diagnosis was Hypothyroidism due to acquired atrophy of thyroid. Diagnoses of Postoperative hypothyroidism, Hypothyroidism, acquired, autoimmune, and Routine general medical examination at a health care facility were also pertinent to this visit.   History of Grave's disease: Discussed recent difficulties with maintaining normal thyroid function related to concurrent use of spironolactone. Her recent TSH normalized on 700 mcg daily but she recalls that she felt better when her weekly dose was 712 mcg. She is requesting an increase thyroid dose from 700 mcg weekly   To 712  mcg weekly .   History [place] has a past medical history of Graves' orbitopathy (2010) and Graves Disease (Sept 2010).   She has past surgical history that includes Breast biopsy (2006).   Her family history includes Cancer in her father and maternal grandmother; Heart disease in her father.She reports that she has never smoked. She has never used smokeless tobacco. She reports that she drinks alcohol. She reports that she does not use illicit drugs.  Outpatient Prescriptions Prior to Visit  Medication Sig Dispense Refill  . Cholecalciferol (VITAMIN D3) 1000 UNITS CAPS Take 1 capsule by mouth daily.      Marland Kitchen levothyroxine (  SYNTHROID, LEVOTHROID) 100 MCG tablet Take 1 tablet (100 mcg total) by mouth daily. 90 tablet 1  . azithromycin (ZITHROMAX) 250 MG tablet 2 tablets on day 1,  1 tablet daily thereafter 6 tablet 0  . chlorpheniramine-HYDROcodone (TUSSIONEX PENNKINETIC ER) 10-8 MG/5ML SUER Take 5 mLs by mouth every 12 (twelve) hours as needed for cough. 180 mL  0  . doxycycline (VIBRA-TABS) 100 MG tablet Take 1 tablet (100 mg total) by mouth 2 (two) times daily. (Patient not taking: Reported on 01/13/2016) 14 tablet 0  . ketoconazole (NIZORAL) 2 % cream Apply topically daily. (Patient not taking: Reported on 01/13/2016) 15 g 2  . predniSONE (DELTASONE) 10 MG tablet 6 tablets on Day 1 , then reduce by 1 tablet daily until gone (Patient not taking: Reported on 01/13/2016) 21 tablet 0  . Selenium (SELENIMIN-200 PO) Take by mouth 2 (two) times daily.     No facility-administered medications prior to visit.    Review of Systems   Patient denies headache, fevers, malaise, unintentional weight loss, skin rash, eye pain, sinus congestion and sinus pain, sore throat, dysphagia,  hemoptysis , cough, dyspnea, wheezing, chest pain, palpitations, orthopnea, edema, abdominal pain, nausea, melena, diarrhea, constipation, flank pain, dysuria, hematuria, urinary  Frequency, nocturia, numbness, tingling, seizures,  Focal weakness, Loss of consciousness,  Tremor, insomnia, depression, anxiety, and suicidal ideation.      Objective:  BP 102/64 mmHg  Pulse 67  Temp(Src) 98.7 F (37.1 C) (Oral)  Resp 12  Ht 5' 7.75" (1.721 m)  Wt 135 lb 8 oz (61.462 kg)  BMI 20.75 kg/m2  SpO2 97%  LMP 12/25/2015  Physical Exam   General appearance: alert, cooperative and appears stated age Ears: normal TM's and external ear canals both ears Throat: lips, mucosa, and tongue normal; teeth and gums normal Neck: no adenopathy, no carotid bruit, supple, symmetrical, trachea midline and thyroid not enlarged, symmetric, no tenderness/mass/nodules Back: symmetric, no curvature. ROM normal. No CVA tenderness. Lungs: clear to auscultation bilaterally Heart: regular rate and rhythm, S1, S2 normal, no murmur, click, rub or gallop Abdomen: soft, non-tender; bowel sounds normal; no masses,  no organomegaly Pulses: 2+ and symmetric Skin: Skin color, texture, turgor normal. No rashes or  lesions Lymph nodes: Cervical, supraclavicular, and axillary nodes normal.    Assessment & Plan:   Problem List Items Addressed This Visit    Hypothyroidism, acquired, autoimmune    Current TSH and T4 are in normal range on reduced dose of 700 mcg weekly which she has been taking for the past 4 weeks.  Previous supratherapeutic level of TSH occurred while taking daily spironolactone, and she is requesting increase in dose to 712 mcg daily. Repeat level will be done in late July. I have refilled the 112 mcg dose so she can take it once weekly,  And continue 100 mcg daily the other 6 days.       Relevant Medications   levothyroxine (SYNTHROID, LEVOTHROID) 112 MCG tablet   levothyroxine (SYNTHROID, LEVOTHROID) 100 MCG tablet   Routine general medical examination at a health care facility    Annual comprehensive preventive exam was done as well as an evaluation and management of chronic conditions .  During the course of the visit the patient was educated and counseled about appropriate screening and preventive services including :  diabetes screening, lipid analysis with projected  10 year  risk for CAD , nutrition counseling, breast, cervical and colorectal cancer screening, and recommended immunizations. Sh eis up to [employer]  all screenings . Lab Results  Component Value Date   CHOL 194 06/20/2015   HDL 66.40 06/20/2015   LDLCALC 115* 06/20/2015   LDLDIRECT 132.0 01/11/2015   TRIG 65.0 06/20/2015   CHOLHDL 3 06/20/2015          Other Visit Diagnoses    Hypothyroidism due to acquired atrophy of thyroid    -  Primary    Relevant Medications    levothyroxine (SYNTHROID, LEVOTHROID) 112 MCG tablet    levothyroxine (SYNTHROID, LEVOTHROID) 100 MCG tablet    Other Relevant Orders    T4 AND TSH    Postoperative hypothyroidism        Relevant Medications    levothyroxine (SYNTHROID, LEVOTHROID) 112 MCG tablet    levothyroxine (SYNTHROID, LEVOTHROID) 100 MCG tablet       I have  discontinued Ms. Herrman's Selenium (SELENIMIN-200 PO), ketoconazole, azithromycin, predniSONE, chlorpheniramine-HYDROcodone, and Dapsone. I have also changed her levothyroxine. Additionally, I am having her start on levothyroxine and ketoconazole. Lastly, I am having her maintain her Vitamin D3 and doxycycline.  Meds ordered this encounter  Medications  . DISCONTD: Dapsone (ACZONE) 7.5 % GEL    Sig: Apply 1 application topically daily.   Marland Kitchen levothyroxine (SYNTHROID, LEVOTHROID) 112 MCG tablet    Sig: Take 1 tablet (112 mcg total) by mouth daily. One day per week    Dispense:  90 tablet    Refill:  3  . levothyroxine (SYNTHROID, LEVOTHROID) 100 MCG tablet    Sig: Take 1 tablet (100 mcg total) by mouth daily. 6 days per week    Dispense:  90 tablet    Refill:  1  . DISCONTD: doxycycline (VIBRA-TABS) 100 MG tablet    Sig: Take 1 tablet (100 mg total) by mouth 2 (two) times daily.    Dispense:  14 tablet    Refill:  0  . ketoconazole (NIZORAL) 2 % cream    Sig: Apply 1 application topically daily.    Dispense:  15 g    Refill:  2  . doxycycline (VIBRA-TABS) 100 MG tablet    Sig: Take 1 tablet (100 mg total) by mouth 2 (two) times daily.    Dispense:  14 tablet    Refill:  3    Replace previous one,   with refills    Medications Discontinued During This Encounter  Medication Reason  . azithromycin (ZITHROMAX) 250 MG tablet Completed Course  . chlorpheniramine-HYDROcodone (TUSSIONEX PENNKINETIC ER) 10-8 MG/5ML SUER Completed Course  . predniSONE (DELTASONE) 10 MG tablet Completed Course  . Selenium (SELENIMIN-200 PO) Error  . levothyroxine (SYNTHROID, LEVOTHROID) 100 MCG tablet Reorder  . Dapsone (ACZONE) 7.5 % GEL   . doxycycline (VIBRA-TABS) 100 MG tablet Reorder  . ketoconazole (NIZORAL) 2 % cream   . doxycycline (VIBRA-TABS) 100 MG tablet Reorder    Follow-up: No Follow-up on file.   [name], MD

## 2016-01-15 NOTE — Assessment & Plan Note (Signed)
Current TSH and T4 are in normal range on reduced dose of 700 mcg weekly which she has been taking for the past 4 weeks.  Previous supratherapeutic level of TSH occurred while taking daily spironolactone, and she is requesting increase in dose to 712 mcg daily. Repeat level will be done in late July. I have refilled the 112 mcg dose so she can take it once weekly,  And continue 100 mcg daily the other 6 days.

## 2016-01-15 NOTE — Assessment & Plan Note (Signed)
Annual comprehensive preventive exam was done as well as an evaluation and management of chronic conditions .  During the course of the visit the patient was educated and counseled about appropriate screening and preventive services including :  diabetes screening, lipid analysis with projected  10 year  risk for CAD , nutrition counseling, breast, cervical and colorectal cancer screening, and recommended immunizations. Sh eis up to dat eon all screenings . Lab Results  Component Value Date   CHOL 194 06/20/2015   HDL 66.40 06/20/2015   LDLCALC 115* 06/20/2015   LDLDIRECT 132.0 01/11/2015   TRIG 65.0 06/20/2015   CHOLHDL 3 06/20/2015

## 2016-01-20 ENCOUNTER — Encounter: Payer: Federal, State, Local not specified - PPO | Admitting: Internal Medicine

## 2016-04-13 ENCOUNTER — Other Ambulatory Visit (INDEPENDENT_AMBULATORY_CARE_PROVIDER_SITE_OTHER): Payer: Federal, State, Local not specified - PPO

## 2016-04-13 DIAGNOSIS — E038 Other specified hypothyroidism: Secondary | ICD-10-CM | POA: Diagnosis not present

## 2016-04-13 DIAGNOSIS — E034 Atrophy of thyroid (acquired): Secondary | ICD-10-CM | POA: Diagnosis not present

## 2016-04-14 LAB — T4 AND TSH
T4, Total: 8.5 ug/dL (ref 4.5–12.0)
TSH: 1.13 u[IU]/mL (ref 0.450–4.500)

## 2016-04-15 ENCOUNTER — Encounter: Payer: Self-pay | Admitting: Internal Medicine

## 2016-04-23 DIAGNOSIS — G44209 Tension-type headache, unspecified, not intractable: Secondary | ICD-10-CM | POA: Diagnosis not present

## 2016-04-23 DIAGNOSIS — M25512 Pain in left shoulder: Secondary | ICD-10-CM | POA: Diagnosis not present

## 2016-04-23 DIAGNOSIS — M9901 Segmental and somatic dysfunction of cervical region: Secondary | ICD-10-CM | POA: Diagnosis not present

## 2016-04-25 DIAGNOSIS — M25512 Pain in left shoulder: Secondary | ICD-10-CM | POA: Diagnosis not present

## 2016-04-25 DIAGNOSIS — G44209 Tension-type headache, unspecified, not intractable: Secondary | ICD-10-CM | POA: Diagnosis not present

## 2016-04-25 DIAGNOSIS — M9901 Segmental and somatic dysfunction of cervical region: Secondary | ICD-10-CM | POA: Diagnosis not present

## 2016-04-30 DIAGNOSIS — M9901 Segmental and somatic dysfunction of cervical region: Secondary | ICD-10-CM | POA: Diagnosis not present

## 2016-04-30 DIAGNOSIS — G44209 Tension-type headache, unspecified, not intractable: Secondary | ICD-10-CM | POA: Diagnosis not present

## 2016-04-30 DIAGNOSIS — M25512 Pain in left shoulder: Secondary | ICD-10-CM | POA: Diagnosis not present

## 2016-05-08 ENCOUNTER — Ambulatory Visit (INDEPENDENT_AMBULATORY_CARE_PROVIDER_SITE_OTHER): Payer: Federal, State, Local not specified - PPO | Admitting: Family

## 2016-05-08 ENCOUNTER — Encounter: Payer: Self-pay | Admitting: Family

## 2016-05-08 VITALS — BP 120/74 | HR 66 | Temp 98.1°F | Wt 138.8 lb

## 2016-05-08 DIAGNOSIS — R590 Localized enlarged lymph nodes: Secondary | ICD-10-CM | POA: Insufficient documentation

## 2016-05-08 DIAGNOSIS — R599 Enlarged lymph nodes, unspecified: Secondary | ICD-10-CM | POA: Diagnosis not present

## 2016-05-08 DIAGNOSIS — H6992 Unspecified Eustachian tube disorder, left ear: Secondary | ICD-10-CM

## 2016-05-08 MED ORDER — FLUTICASONE PROPIONATE 50 MCG/ACT NA SUSP: 2.0000 | Freq: Every day | NASAL | 2 refills | Status: AC

## 2016-05-08 NOTE — Progress Notes (Signed)
Pre visit review using our clinic review tool, if applicable. No additional management support is needed unless otherwise documented below in the visit note. 

## 2016-05-08 NOTE — Progress Notes (Signed)
Subjective:    Patient ID: Rebecca Cole, female    DOB: 1961-06-05, 55 y.o.   MRN: [id number]  CC: Rebecca Cole is a 55 y.o. female who presents today for an acute visit.    HPI: Patient here for acute visit with chief complaint of left ear clogged for the past 3 days. Describes ear as muffled. Better today. 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 No fever, chills. Had mild HA however believes it's related to neck pain which she is seeing chiropracter for. No vision changes, nasal discharge, sinus pressure.   Lymph node right side of neck for past couple of months., 'dramatically improved'. Suspect I is related to chronic neck pain treatment with chiropractor. No night sweats, unintentional weight loss, bone pain. No history of cancer.    HISTORY:  Past Medical History:  Diagnosis Date  . Graves Disease Sept 2010   s/p 131 ablation, managed by [facility] medical assoicates Dr. Bubba Camp  . Graves' orbitopathy 2010   managed by [facility]   Past Surgical History:  Procedure Laterality Date  . BREAST BIOPSY  2006   negative   Family History  Problem Relation Age of Onset  . Heart disease Father     CABG, Ablation for Atrial Flutter, valve replacement, pacemaker, pericardial effusion s/p window  . Cancer Father     late 52's history of colon CA  . Cancer Maternal Grandmother     possibly benign    Allergies: Methimazole Current Outpatient Prescriptions on File Prior to Visit  Medication Sig Dispense Refill  . Cholecalciferol (VITAMIN D3) 1000 UNITS CAPS Take 1 capsule by mouth daily.      Marland Kitchen doxycycline (VIBRA-TABS) 100 MG tablet Take 1 tablet (100 mg total) by mouth 2 (two) times daily. 14 tablet 3  . ketoconazole (NIZORAL) 2 % cream Apply 1 application topically daily. 15 g 2  . levothyroxine (SYNTHROID, LEVOTHROID) 100 MCG tablet Take 1 tablet (100 mcg total) by mouth daily. 6 days per week 90 tablet 1  . levothyroxine (SYNTHROID, LEVOTHROID) 112  MCG tablet Take 1 tablet (112 mcg total) by mouth daily. One day per week 90 tablet 3   No current facility-administered medications on file prior to visit.     Social History  Substance Use Topics  . Smoking status: Never Smoker  . Smokeless tobacco: Never Used  . Alcohol use 0.0 oz/week     Comment: moderate    Review of Systems  Constitutional: Negative for chills, fever and unexpected weight change.  HENT: Negative for congestion, ear discharge, ear pain, sinus pressure and sore throat.   Respiratory: Negative for cough.   Cardiovascular: Negative for chest pain and palpitations.  Gastrointestinal: Negative for nausea and vomiting.  Hematological: Positive for adenopathy.      Objective:    BP 120/74   Pulse 66   Temp 98.1 F (36.7 C) (Oral)   Wt 138 lb 12.8 oz (63 kg)   SpO2 98%   BMI 21.26 kg/m    Physical Exam  Constitutional: She appears well-developed and well-nourished.  HENT:  Head: Normocephalic and atraumatic.  Right Ear: Hearing, tympanic membrane, external ear and ear canal normal. No drainage, swelling or tenderness. No foreign bodies. Tympanic membrane is not erythematous and not bulging. No middle ear effusion. No decreased hearing is noted.  Left Ear: Hearing, tympanic membrane, external ear and ear canal normal. No drainage, swelling or tenderness. No foreign bodies. Tympanic membrane is not erythematous  and not bulging.  No middle ear effusion. No decreased hearing is noted.  Nose: Nose normal. No rhinorrhea. Right sinus exhibits no maxillary sinus tenderness and no frontal sinus tenderness. Left sinus exhibits no maxillary sinus tenderness and no frontal sinus tenderness.  Mouth/Throat: Uvula is midline, oropharynx is clear and moist and mucous membranes are normal. No oropharyngeal exudate, posterior oropharyngeal edema, posterior oropharyngeal erythema or tonsillar abscesses.  Eyes: Conjunctivae are normal.  Neck:    Cardiovascular: Regular  rhythm, normal heart sounds and normal pulses.   Pulmonary/Chest: Effort normal and breath sounds normal. She has no wheezes. She has no rhonchi. She has no rales.  Lymphadenopathy:       Head (right side): No submental, no submandibular, no tonsillar, no preauricular, no posterior auricular and no occipital adenopathy present.       Head (left side): No submental, no submandibular, no tonsillar, no preauricular, no posterior auricular and no occipital adenopathy present.    She has cervical adenopathy.       Right cervical: Posterior cervical adenopathy present. No superficial cervical and no deep cervical adenopathy present.      Left cervical: No superficial cervical, no deep cervical and no posterior cervical adenopathy present.  <1cm discrete non tender movable mass.  Neurological: She is alert.  Skin: Skin is warm and dry.  Psychiatric: She has a normal mood and affect. Her speech is normal and behavior is normal. Thought content normal.  Vitals reviewed.      Assessment & Plan:   Problem List Items Addressed This Visit      Nervous and Auditory   Eustachian tube disorder, left - Primary    No evidence of infectious etiology. Afebrile. "Muffled" sensation patient describes has resolved today. Patient I agreed on conservative management and she will fill the [facility] prescription medicine if the symptom recurs.      Relevant Medications   fluticasone (FLONASE) 50 MCG/ACT nasal spray     Immune and Lymphatic   Enlarged lymph nodes    Reassured as patient has no alarm symptoms. Has decreased in size.  I suspect it is either asymmetry in ligaments of the posterior cervical spine or reactive lymph node. Discussed with patient at great length symptoms of lymph nodes which would be of great concern. I offered soft tissue ultrasound and patient politely declined today. She prefers to stay vigilant and will call us for follow-up appointment if it does not completely resolve or if it  enlarges.       Other Visit Diagnoses   None.       I am having Ms. Clover start on fluticasone. I am also having her maintain her Vitamin D3, levothyroxine, levothyroxine, ketoconazole, and doxycycline.   Meds ordered this encounter  Medications  . fluticasone (FLONASE) 50 MCG/ACT nasal spray    Sig: Place 2 sprays into both nostrils daily.    Dispense:  16 g    Refill:  2    Order Specific Question:   Supervising Provider    Answer:   [name] [[id number]]    Return precautions given.   Risks, benefits, and alternatives of the medications and treatment plan prescribed today were discussed, and patient expressed understanding.   Education regarding symptom management and diagnosis given to patient on AVS.  Continue to follow with TULLO, Aris Everts, MD for routine health maintenance.   Regan Lemming and I agreed with plan.   Mable Paris, FNP

## 2016-05-08 NOTE — Patient Instructions (Signed)
Let me know if lymph node does not completely resolve or gets larger.   If there is no improvement in your symptoms, or if there is any worsening of symptoms, or if you have any additional concerns, please return for re-evaluation; or, if we are closed, consider going to the Emergency Room for evaluation if symptoms urgent.   Barotitis Media Barotitis media is inflammation of your middle ear. This occurs when the auditory tube (eustachian tube) leading from the back of your nose (nasopharynx) to your eardrum is blocked. This blockage may result from a cold, environmental allergies, or an upper respiratory infection. Unresolved barotitis media may lead to damage or hearing loss (barotrauma), which may become permanent. HOME CARE INSTRUCTIONS   Use medicines as recommended by your health care provider. Over-the-counter medicines will help unblock the canal and can help during times of air travel.  Do not put anything into your ears to clean or unplug them. Eardrops will not be helpful.  Do not swim, dive, or fly until your health care provider says it is all right to do so. If these activities are necessary, chewing gum with frequent, forceful swallowing may help. It is also helpful to hold your nose and gently blow to pop your ears for equalizing pressure changes. This forces air into the eustachian tube.  Only take over-the-counter or prescription medicines for pain, discomfort, or fever as directed by your health care provider.  A decongestant may be helpful in decongesting the middle ear and make pressure equalization easier. SEEK MEDICAL CARE IF:  You experience a serious form of dizziness in which you feel as if the room is spinning and you feel nauseated (vertigo).  Your symptoms only involve one ear. SEEK IMMEDIATE MEDICAL CARE IF:   You develop a severe headache, dizziness, or severe ear pain.  You have bloody or pus-like drainage from your ears.  You develop a fever.  Your  problems do not improve or become worse. MAKE SURE YOU:   Understand these instructions.  Will watch your condition.  Will get help right away if you are not doing well or get worse.   This information is not intended to replace advice given to you by your health care provider. Make sure you discuss any questions you have with your health care provider.   Document Released: 07/20/2000 Document Revised: 05/13/2013 Document Reviewed: 02/17/2013 Elsevier Interactive Patient Education [employer].

## 2016-05-08 NOTE — Assessment & Plan Note (Signed)
Reassured as patient has no alarm symptoms. Has decreased in size.  I suspect it is either asymmetry in ligaments of the posterior cervical spine or reactive lymph node. Discussed with patient at great length symptoms of lymph nodes which would be of great concern. I offered soft tissue ultrasound and patient politely declined today. She prefers to stay vigilant and will call us for follow-up appointment if it does not completely resolve or if it enlarges.

## 2016-05-08 NOTE — Assessment & Plan Note (Signed)
No evidence of infectious etiology. Afebrile. "Muffled" sensation patient describes has resolved today. Patient I agreed on conservative management and she will fill the [facility] prescription medicine if the symptom recurs.

## 2016-05-10 DIAGNOSIS — E0500 Thyrotoxicosis with diffuse goiter without thyrotoxic crisis or storm: Secondary | ICD-10-CM | POA: Diagnosis not present

## 2016-05-14 DIAGNOSIS — M9901 Segmental and somatic dysfunction of cervical region: Secondary | ICD-10-CM | POA: Diagnosis not present

## 2016-05-14 DIAGNOSIS — G44209 Tension-type headache, unspecified, not intractable: Secondary | ICD-10-CM | POA: Diagnosis not present

## 2016-05-14 DIAGNOSIS — M25512 Pain in left shoulder: Secondary | ICD-10-CM | POA: Diagnosis not present

## 2016-05-15 DIAGNOSIS — K080 Exfoliation of teeth due to systemic causes: Secondary | ICD-10-CM | POA: Diagnosis not present

## 2016-05-16 DIAGNOSIS — M9901 Segmental and somatic dysfunction of cervical region: Secondary | ICD-10-CM | POA: Diagnosis not present

## 2016-05-16 DIAGNOSIS — M25512 Pain in left shoulder: Secondary | ICD-10-CM | POA: Diagnosis not present

## 2016-05-16 DIAGNOSIS — G44209 Tension-type headache, unspecified, not intractable: Secondary | ICD-10-CM | POA: Diagnosis not present

## 2016-05-18 DIAGNOSIS — M25512 Pain in left shoulder: Secondary | ICD-10-CM | POA: Diagnosis not present

## 2016-05-18 DIAGNOSIS — M9901 Segmental and somatic dysfunction of cervical region: Secondary | ICD-10-CM | POA: Diagnosis not present

## 2016-05-18 DIAGNOSIS — G44209 Tension-type headache, unspecified, not intractable: Secondary | ICD-10-CM | POA: Diagnosis not present

## 2016-05-21 DIAGNOSIS — M9901 Segmental and somatic dysfunction of cervical region: Secondary | ICD-10-CM | POA: Diagnosis not present

## 2016-05-21 DIAGNOSIS — G44209 Tension-type headache, unspecified, not intractable: Secondary | ICD-10-CM | POA: Diagnosis not present

## 2016-05-21 DIAGNOSIS — M25512 Pain in left shoulder: Secondary | ICD-10-CM | POA: Diagnosis not present

## 2016-05-28 DIAGNOSIS — G44209 Tension-type headache, unspecified, not intractable: Secondary | ICD-10-CM | POA: Diagnosis not present

## 2016-05-28 DIAGNOSIS — M25512 Pain in left shoulder: Secondary | ICD-10-CM | POA: Diagnosis not present

## 2016-05-28 DIAGNOSIS — M9901 Segmental and somatic dysfunction of cervical region: Secondary | ICD-10-CM | POA: Diagnosis not present

## 2016-06-18 ENCOUNTER — Encounter: Payer: Self-pay | Admitting: Internal Medicine

## 2016-06-18 ENCOUNTER — Ambulatory Visit (INDEPENDENT_AMBULATORY_CARE_PROVIDER_SITE_OTHER): Payer: Federal, State, Local not specified - PPO | Admitting: Internal Medicine

## 2016-06-18 VITALS — BP 118/70 | HR 56 | Temp 98.2°F | Resp 12 | Ht 68.0 in | Wt 140.8 lb

## 2016-06-18 DIAGNOSIS — E782 Mixed hyperlipidemia: Secondary | ICD-10-CM

## 2016-06-18 DIAGNOSIS — R590 Localized enlarged lymph nodes: Secondary | ICD-10-CM

## 2016-06-18 DIAGNOSIS — E559 Vitamin D deficiency, unspecified: Secondary | ICD-10-CM | POA: Diagnosis not present

## 2016-06-18 DIAGNOSIS — R5383 Other fatigue: Secondary | ICD-10-CM

## 2016-06-18 DIAGNOSIS — E063 Autoimmune thyroiditis: Secondary | ICD-10-CM

## 2016-06-18 DIAGNOSIS — E038 Other specified hypothyroidism: Secondary | ICD-10-CM | POA: Diagnosis not present

## 2016-06-18 DIAGNOSIS — E0500 Thyrotoxicosis with diffuse goiter without thyrotoxic crisis or storm: Secondary | ICD-10-CM

## 2016-06-18 NOTE — Patient Instructions (Signed)
Your "lump" may be a reactive lymph node , reacting to chronic sinusitis  Referral to  ENT Dr. Margaretha Sheffield in [facility] for evaluation of both  We will retest your thyroid in January

## 2016-06-18 NOTE — Progress Notes (Addendum)
Subjective:  Patient ID: Rebecca Cole, female    DOB: 07-17-1961  Age: 55 y.o. MRN: [id number]  CC: The primary encounter diagnosis was Hypothyroidism, acquired, autoimmune. Diagnoses of Fatigue, unspecified type, Vitamin D deficiency, Mixed hyperlipidemia, Occipital lymphadenopathy, and Graves' orbitopathy were also pertinent to this visit.  HPI Marytza Fuson presents for evaluation of recurrent lumps on back of her neck. Patient states that she has had  recurrent painless lumps bilaterally that first occurred last spring and resolved spontaneously, but had a recurrence about 8 weeks ago . She initially thought they might be muscle spasms since she was also experiencing neck pain ,so she had several vigorous massages focussed on her neck. Was seen by NP one month ago with concurrent symptoms of muffled hearing on left and offered an ultrasound of her neck for further workup but declined, and was  treated for barotitis media with Flonase.   However she remains concerned about the etiology of her lumps and is conflicted about further evaluation "if everything is going be normal." The neck pain improved and the right sided lumps has resolved but the one on the left has remained.  She denies any  recent  viral uri but has had chronic rhinitis  For years without symptoms of allergic rhinitis .  She recalls that  during her tonsillectomy for recurrent tonsil stones,  The [facility] report noted chronic infection and wonders if she has chronic sinusitis as well. Patient has been studying carpentry and has had recurrent and daily exposure to sawdust and dusty environments for the past several month.  No fevers,  Cough, Sinus pain,  Night sweats, or unintentional weight loss.     Outpatient Medications Prior to Visit  Medication Sig Dispense Refill  . Cholecalciferol (VITAMIN D3) 1000 UNITS CAPS Take 1 capsule by mouth daily.      . fluticasone (FLONASE) 50 MCG/ACT nasal spray Place 2 sprays into both nostrils daily. 16  g 2  . ketoconazole (NIZORAL) 2 % cream Apply 1 application topically daily. 15 g 2  . levothyroxine (SYNTHROID, LEVOTHROID) 112 MCG tablet Take 1 tablet (112 mcg total) by mouth daily. One day per week 90 tablet 3  . levothyroxine (SYNTHROID, LEVOTHROID) 100 MCG tablet Take 1 tablet (100 mcg total) by mouth daily. 6 days per week 90 tablet 1  . doxycycline (VIBRA-TABS) 100 MG tablet Take 1 tablet (100 mg total) by mouth 2 (two) times daily. (Patient not taking: Reported on 06/18/2016) 14 tablet 3   No facility-administered medications prior to visit.     Review of Systems;  Patient denies headache, fevers, malaise, unintentional weight loss, skin rash, eye pain, sinus congestion and sinus pain, sore throat, dysphagia,  hemoptysis , cough, dyspnea, wheezing, chest pain, palpitations, orthopnea, edema, abdominal pain, nausea, melena, diarrhea, constipation, flank pain, dysuria, hematuria, urinary  Frequency, nocturia, numbness, tingling, seizures,  Focal weakness, Loss of consciousness,  Tremor, insomnia, depression, anxiety, and suicidal ideation.      Objective:  BP 118/70   Pulse (!) 56   Temp 98.2 F (36.8 C) (Oral)   Resp 12   Ht 5\' 8"  (1.727 m)   Wt 140 lb 12 oz (63.8 kg)   SpO2 99%   BMI 21.40 kg/m   BP Readings from Last 3 Encounters:  06/18/16 118/70  05/08/16 120/74  01/13/16 102/64    Wt Readings from Last 3 Encounters:  06/18/16 140 lb 12 oz (63.8 kg)  05/08/16 138 lb 12.8 oz (63 kg)  01/13/16 135  lb 8 oz (61.5 kg)    General appearance: alert, cooperative and appears stated age Ears: normal TM's and external ear canals both ears Throat: lips, mucosa, and tongue normal; teeth and gums normal Neck: no adenopathy, no carotid bruit, supple, symmetrical, trachea midline and thyroid not enlarged, symmetric, no tenderness/mass/nodules Back: symmetric, no curvature. ROM normal. No CVA tenderness. Lungs: clear to auscultation bilaterally Heart: regular rate and rhythm,  S1, S2 normal, no murmur, click, rub or gallop Abdomen: soft, non-tender; bowel sounds normal; no masses,  no organomegaly Pulses: 2+ and symmetric Skin: Skin color, texture, turgor normal. No rashes or lesions Lymph node: 5 mm diameter node at left occiput, at hairline, non tender . No other Cervical, supraclavicular, or  axillary LAD  No results found for: HGBA1C  Lab Results  Component Value Date   CREATININE 0.84 07/16/2016   CREATININE 0.75 06/20/2015   CREATININE 0.73 01/11/2015    Lab Results  Component Value Date   WBC 5.4 07/16/2016   HGB 12.9 07/16/2016   HCT 38.3 07/16/2016   PLT 276.0 07/16/2016   GLUCOSE 101 (H) 07/16/2016   CHOL 217 (H) 07/16/2016   TRIG 73.0 07/16/2016   HDL 71.80 07/16/2016   LDLDIRECT 132.0 01/11/2015   LDLCALC 131 (H) 07/16/2016   ALT 16 07/16/2016   AST 13 07/16/2016   NA 139 07/16/2016   K 4.6 07/16/2016   CL 106 07/16/2016   CREATININE 0.84 07/16/2016   BUN 12 07/16/2016   CO2 26 07/16/2016   TSH 2.870 07/16/2016    Mm Screening Breast Tomo Bilateral  Result Date: 01/11/2016 CLINICAL DATA:  Screening. EXAM: 2D DIGITAL SCREENING BILATERAL MAMMOGRAM WITH CAD AND ADJUNCT TOMO COMPARISON:  Previous exam(s). ACR Breast Density Category c: The breast tissue is heterogeneously dense, which may obscure small masses. FINDINGS: There are no findings suspicious for malignancy. Images were processed with CAD. IMPRESSION: No mammographic evidence of malignancy. A result letter of this screening mammogram will be mailed directly to the patient. RECOMMENDATION: Screening mammogram in one year. (Code:SM-B-01Y) BI-RADS CATEGORY  1: Negative. Electronically Signed   By: Claudie Revering M.D.   On: 01/11/2016 16:59    Assessment & Plan:   Problem List Items Addressed This Visit    Fatigue   Relevant Orders   Comprehensive metabolic panel (Completed)   CBC with Differential/Platelet (Completed)   Graves' orbitopathy    She underwent oculoplastic surgery  to correct bilateral ptosis and laxiity in July by Dr. Daiva Huge with excellent  results.       Hypothyroidism, acquired, autoimmune - Primary    Thyroid function was WNL on current dose in September .  No current changes needed. Repeat TSH and T4 in January  Lab Results  Component Value Date   TSH 1.130 04/13/2016         Relevant Orders   T4 AND TSH (Completed)   Occipital lymphadenopathy    We had a rather lengthy discussion about whether her LAD is reactive vs pathologic, given that she denies any pain associated with the nodes and has no other LAD on exam.  Given her persistent sinus drainage which has not improved with Flonase or antihistamine use,  We ultimately decided to have her see ENT for evaluation of sinuses and follow up on LAD with potential LN biopsy if it does not resolve. Referral to [facility]       Relevant Orders   Ambulatory referral to ENT    Other Visit Diagnoses  Vitamin D deficiency       Relevant Orders   VITAMIN D 25 Hydroxy (Vit-D Deficiency, Fractures) (Completed)   Mixed hyperlipidemia       Relevant Orders   Lipid panel (Completed)      I am having Ms. Macia maintain her Vitamin D3, levothyroxine, ketoconazole, doxycycline, and fluticasone.  No orders of the defined types were placed in this encounter.   There are no discontinued medications.  Follow-up: No Follow-up on file.   [name], MD

## 2016-06-18 NOTE — Progress Notes (Signed)
Pre-visit discussion using our clinic review tool. No additional management support is needed unless otherwise documented below in the visit note.  

## 2016-06-19 NOTE — Assessment & Plan Note (Signed)
We had a rather lengthy discussion about whether her LAD is reactive vs pathologic, given that she denies any pain associated with the nodes and has no other LAD on exam.  Given her persistent sinus drainage which has not improved with Flonase or antihistamine use,  We ultimately decided to have her see ENT for evaluation of sinuses and follow up on LAD with potential LN biopsy if it does not resolve. Referral to [facility]

## 2016-06-19 NOTE — Assessment & Plan Note (Addendum)
She underwent oculoplastic surgery to correct bilateral ptosis and laxiity in July by Dr. Daiva Huge with excellent  results.

## 2016-06-19 NOTE — Assessment & Plan Note (Signed)
Thyroid function was WNL on current dose in September .  No current changes needed. Repeat TSH and T4 in January  Lab Results  Component Value Date   TSH 1.130 04/13/2016

## 2016-07-03 ENCOUNTER — Encounter: Payer: Self-pay | Admitting: Internal Medicine

## 2016-07-12 ENCOUNTER — Other Ambulatory Visit: Payer: Self-pay | Admitting: Internal Medicine

## 2016-07-13 DIAGNOSIS — R6889 Other general symptoms and signs: Secondary | ICD-10-CM | POA: Diagnosis not present

## 2016-07-13 DIAGNOSIS — J310 Chronic rhinitis: Secondary | ICD-10-CM | POA: Diagnosis not present

## 2016-07-16 ENCOUNTER — Other Ambulatory Visit (INDEPENDENT_AMBULATORY_CARE_PROVIDER_SITE_OTHER): Payer: Federal, State, Local not specified - PPO

## 2016-07-16 DIAGNOSIS — R5383 Other fatigue: Secondary | ICD-10-CM

## 2016-07-16 DIAGNOSIS — E063 Autoimmune thyroiditis: Secondary | ICD-10-CM

## 2016-07-16 DIAGNOSIS — E038 Other specified hypothyroidism: Secondary | ICD-10-CM | POA: Diagnosis not present

## 2016-07-16 DIAGNOSIS — E782 Mixed hyperlipidemia: Secondary | ICD-10-CM | POA: Diagnosis not present

## 2016-07-16 DIAGNOSIS — E559 Vitamin D deficiency, unspecified: Secondary | ICD-10-CM | POA: Diagnosis not present

## 2016-07-16 LAB — CBC WITH DIFFERENTIAL/PLATELET
BASOS PCT: 0.6 % (ref 0.0–3.0)
Basophils Absolute: 0 10*3/uL (ref 0.0–0.1)
EOS ABS: 0.1 10*3/uL (ref 0.0–0.7)
EOS PCT: 1.9 % (ref 0.0–5.0)
HCT: 38.3 % (ref 36.0–46.0)
Hemoglobin: 12.9 g/dL (ref 12.0–15.0)
LYMPHS ABS: 1.3 10*3/uL (ref 0.7–4.0)
Lymphocytes Relative: 24.1 % (ref 12.0–46.0)
MCHC: 33.6 g/dL (ref 30.0–36.0)
MCV: 91.7 fl (ref 78.0–100.0)
MONO ABS: 0.3 10*3/uL (ref 0.1–1.0)
Monocytes Relative: 6.4 % (ref 3.0–12.0)
NEUTROS ABS: 3.6 10*3/uL (ref 1.4–7.7)
NEUTROS PCT: 67 % (ref 43.0–77.0)
PLATELETS: 276 10*3/uL (ref 150.0–400.0)
RBC: 4.17 Mil/uL (ref 3.87–5.11)
RDW: 14.1 % (ref 11.5–15.5)
WBC: 5.4 10*3/uL (ref 4.0–10.5)

## 2016-07-16 LAB — COMPREHENSIVE METABOLIC PANEL
ALT: 16 U/L (ref 0–35)
AST: 13 U/L (ref 0–37)
Albumin: 4.3 g/dL (ref 3.5–5.2)
Alkaline Phosphatase: 57 U/L (ref 39–117)
BILIRUBIN TOTAL: 0.6 mg/dL (ref 0.2–1.2)
BUN: 12 mg/dL (ref 6–23)
CALCIUM: 9.2 mg/dL (ref 8.4–10.5)
CHLORIDE: 106 meq/L (ref 96–112)
CO2: 26 mEq/L (ref 19–32)
CREATININE: 0.84 mg/dL (ref 0.40–1.20)
GFR: 75.08 mL/min (ref >60.00)
GLUCOSE: 101 mg/dL — AB (ref 70–99)
Potassium: 4.6 mEq/L (ref 3.5–5.1)
SODIUM: 139 meq/L (ref 135–145)
Total Protein: 6.4 g/dL (ref 6.0–8.3)

## 2016-07-16 LAB — VITAMIN D 25 HYDROXY (VIT D DEFICIENCY, FRACTURES): VITD: 35.52 ng/mL (ref 30.00–100.00)

## 2016-07-16 LAB — LIPID PANEL
CHOLESTEROL: 217 mg/dL — AB (ref 0–200)
HDL: 71.8 mg/dL (ref >39.00)
LDL CALC: 131 mg/dL — AB (ref 0–99)
NonHDL: 145.48
TRIGLYCERIDES: 73 mg/dL (ref 0.0–149.0)
Total CHOL/HDL Ratio: 3
VLDL: 14.6 mg/dL (ref 0.0–40.0)

## 2016-07-17 LAB — T4 AND TSH
T4, Total: 7.4 ug/dL (ref 4.5–12.0)
TSH: 2.87 u[IU]/mL (ref 0.450–4.500)

## 2016-07-19 ENCOUNTER — Encounter: Payer: Self-pay | Admitting: Internal Medicine

## 2016-07-26 ENCOUNTER — Other Ambulatory Visit: Payer: Self-pay | Admitting: Internal Medicine

## 2016-07-26 DIAGNOSIS — E063 Autoimmune thyroiditis: Secondary | ICD-10-CM

## 2016-07-26 MED ORDER — LEVOTHYROXINE SODIUM 112 MCG PO TABS: 112.0000 ug | ORAL_TABLET | Freq: Every day | ORAL | 3 refills | Status: DC

## 2016-09-05 ENCOUNTER — Other Ambulatory Visit (INDEPENDENT_AMBULATORY_CARE_PROVIDER_SITE_OTHER): Payer: Federal, State, Local not specified - PPO

## 2016-09-05 DIAGNOSIS — E063 Autoimmune thyroiditis: Secondary | ICD-10-CM

## 2016-09-05 DIAGNOSIS — E038 Other specified hypothyroidism: Secondary | ICD-10-CM | POA: Diagnosis not present

## 2016-09-06 ENCOUNTER — Encounter: Payer: Self-pay | Admitting: Internal Medicine

## 2016-09-06 LAB — T4 AND TSH
T4 TOTAL: 7.9 ug/dL (ref 4.5–12.0)
TSH: 0.704 u[IU]/mL (ref 0.450–4.500)

## 2016-09-24 ENCOUNTER — Other Ambulatory Visit: Payer: Self-pay | Admitting: Obstetrics & Gynecology

## 2016-09-24 DIAGNOSIS — Z1231 Encounter for screening mammogram for malignant neoplasm of breast: Secondary | ICD-10-CM

## 2016-09-28 DIAGNOSIS — M25512 Pain in left shoulder: Secondary | ICD-10-CM | POA: Diagnosis not present

## 2016-09-28 DIAGNOSIS — G44209 Tension-type headache, unspecified, not intractable: Secondary | ICD-10-CM | POA: Diagnosis not present

## 2016-09-28 DIAGNOSIS — M9901 Segmental and somatic dysfunction of cervical region: Secondary | ICD-10-CM | POA: Diagnosis not present

## 2016-10-05 DIAGNOSIS — G44209 Tension-type headache, unspecified, not intractable: Secondary | ICD-10-CM | POA: Diagnosis not present

## 2016-10-05 DIAGNOSIS — M25512 Pain in left shoulder: Secondary | ICD-10-CM | POA: Diagnosis not present

## 2016-10-05 DIAGNOSIS — M9901 Segmental and somatic dysfunction of cervical region: Secondary | ICD-10-CM | POA: Diagnosis not present

## 2016-10-12 DIAGNOSIS — M25512 Pain in left shoulder: Secondary | ICD-10-CM | POA: Diagnosis not present

## 2016-10-12 DIAGNOSIS — G44209 Tension-type headache, unspecified, not intractable: Secondary | ICD-10-CM | POA: Diagnosis not present

## 2016-10-12 DIAGNOSIS — M9901 Segmental and somatic dysfunction of cervical region: Secondary | ICD-10-CM | POA: Diagnosis not present

## 2016-10-19 DIAGNOSIS — M9901 Segmental and somatic dysfunction of cervical region: Secondary | ICD-10-CM | POA: Diagnosis not present

## 2016-10-19 DIAGNOSIS — G44209 Tension-type headache, unspecified, not intractable: Secondary | ICD-10-CM | POA: Diagnosis not present

## 2016-10-19 DIAGNOSIS — M25512 Pain in left shoulder: Secondary | ICD-10-CM | POA: Diagnosis not present

## 2016-10-29 DIAGNOSIS — G44209 Tension-type headache, unspecified, not intractable: Secondary | ICD-10-CM | POA: Diagnosis not present

## 2016-10-29 DIAGNOSIS — M9901 Segmental and somatic dysfunction of cervical region: Secondary | ICD-10-CM | POA: Diagnosis not present

## 2016-10-29 DIAGNOSIS — M25512 Pain in left shoulder: Secondary | ICD-10-CM | POA: Diagnosis not present

## 2016-10-31 ENCOUNTER — Telehealth: Payer: Self-pay | Admitting: *Deleted

## 2016-10-31 DIAGNOSIS — M25512 Pain in left shoulder: Secondary | ICD-10-CM | POA: Diagnosis not present

## 2016-10-31 DIAGNOSIS — M9901 Segmental and somatic dysfunction of cervical region: Secondary | ICD-10-CM | POA: Diagnosis not present

## 2016-10-31 DIAGNOSIS — G44209 Tension-type headache, unspecified, not intractable: Secondary | ICD-10-CM | POA: Diagnosis not present

## 2016-10-31 NOTE — Telephone Encounter (Signed)
Patient has been seeing a chiropractor, she was advised that she possibly may have some inflammation in her back. She requested a call to discuss further care Pt contact [phone]

## 2016-10-31 NOTE — Telephone Encounter (Signed)
Please offer her an appointment . You can use Monday afternoon if slots are available

## 2016-10-31 NOTE — Telephone Encounter (Signed)
Patient states seeing chiropractor diagnosed her with pinched nerve C5, told her that that she  would need to consult with PCP  For further treatment .  She is taking ibuprofen 400 mg bid prn.   Please advise.

## 2016-11-01 NOTE — Telephone Encounter (Signed)
I could not reach the patient to inform her of her appointment for Monday at 5:00.

## 2016-11-01 NOTE — Telephone Encounter (Signed)
The Monday evening slots are available, or 4:30 on Tuesday or wednesday

## 2016-11-01 NOTE — Telephone Encounter (Signed)
Can you add please thanks

## 2016-11-01 NOTE — Telephone Encounter (Signed)
Pt called back confirming appt

## 2016-11-01 NOTE — Telephone Encounter (Signed)
No appointments available Monday, Tues, Wed.  patient prefers to see you.  Would you be willing to work in .  Please advise.

## 2016-11-05 ENCOUNTER — Ambulatory Visit (INDEPENDENT_AMBULATORY_CARE_PROVIDER_SITE_OTHER): Payer: Federal, State, Local not specified - PPO | Admitting: Internal Medicine

## 2016-11-05 ENCOUNTER — Encounter: Payer: Self-pay | Admitting: Internal Medicine

## 2016-11-05 VITALS — BP 110/82 | HR 55 | Temp 98.5°F | Wt 147.2 lb

## 2016-11-05 DIAGNOSIS — M5412 Radiculopathy, cervical region: Secondary | ICD-10-CM | POA: Diagnosis not present

## 2016-11-05 DIAGNOSIS — E038 Other specified hypothyroidism: Secondary | ICD-10-CM

## 2016-11-05 DIAGNOSIS — E063 Autoimmune thyroiditis: Secondary | ICD-10-CM

## 2016-11-05 MED ORDER — TRAMADOL HCL 50 MG PO TABS: 50.0000 mg | ORAL_TABLET | Freq: Three times a day (TID) | ORAL | 0 refills | Status: DC | PRN

## 2016-11-05 MED ORDER — PREDNISONE 10 MG PO TABS: ORAL_TABLET | ORAL | 0 refills | Status: DC

## 2016-11-05 NOTE — Progress Notes (Signed)
Subjective:  Patient ID: Rebecca Cole, female    DOB: Apr 25, 1961  Age: 56 y.o. MRN: [id number]  CC: The primary encounter diagnosis was Hypothyroidism, acquired, autoimmune. A diagnosis of Cervical radiculopathy at C5 was also pertinent to this visit.  HPI Britainy Kozub presents for left shoulder/arm pain  Arm pain has been persistent since December not responding to chiropractic manipulation. Has been tentatively diagnosed with a c5 radiculopathjy,     taking 400 mg ibuprofen twice daily .  Aggravated by carrying her kayak  Hypothyroidism:  Has a very narrow therapeutic range of 0.5 to 2.0 symptomatic for tsh > 2.0   712 perfect dose,  Then started gaining weight   Hormonal acne per dermatology  112 x 2,  N 100 x 5  Feels that her symptoms of thyroid disorder are definitely aggravated if her tsh goes above 2.5    Does not want to see an endocrinologist again   Outpatient Medications Prior to Visit  Medication Sig Dispense Refill  . Cholecalciferol (VITAMIN D3) 1000 UNITS CAPS Take 1 capsule by mouth daily.      Marland Kitchen doxycycline (VIBRA-TABS) 100 MG tablet Take 1 tablet (100 mg total) by mouth 2 (two) times daily. 14 tablet 3  . fluticasone (FLONASE) 50 MCG/ACT nasal spray Place 2 sprays into both nostrils daily. 16 g 2  . ketoconazole (NIZORAL) 2 % cream Apply 1 application topically daily. 15 g 2  . levothyroxine (SYNTHROID, LEVOTHROID) 100 MCG tablet TAKE 1 TABLET (100 MCG TOTAL) BY MOUTH 6 DAYS PER WEEK (OTHER DAY WILL BE 112 MG) (Patient taking differently: TAKE 1 TABLET (100 MCG TOTAL) BY MOUTH 5 DAYS PER WEEK (OTHER DAY WILL BE 112 MG)) 90 tablet 1  . levothyroxine (SYNTHROID, LEVOTHROID) 112 MCG tablet Take 1 tablet (112 mcg total) by mouth daily. One day per week (Patient taking differently: Take 112 mcg by mouth daily. Two day per week) 90 tablet 3   No facility-administered medications prior to visit.     Review of Systems;  Patient denies headache, fevers, malaise, unintentional  weight loss, skin rash, eye pain, sinus congestion and sinus pain, sore throat, dysphagia,  hemoptysis , cough, dyspnea, wheezing, chest pain, palpitations, orthopnea, edema, abdominal pain, nausea, melena, diarrhea, constipation, flank pain, dysuria, hematuria, urinary  Frequency, nocturia, numbness, tingling, seizures,  Focal weakness, Loss of consciousness,  Tremor, insomnia, depression, anxiety, and suicidal ideation.      Objective:  BP 110/82   Pulse (!) 55   Temp 98.5 F (36.9 C) (Oral)   Wt 147 lb 4 oz (66.8 kg)   SpO2 97%   BMI 22.39 kg/m   BP Readings from Last 3 Encounters:  11/05/16 110/82  06/18/16 118/70  05/08/16 120/74    Wt Readings from Last 3 Encounters:  11/05/16 147 lb 4 oz (66.8 kg)  06/18/16 140 lb 12 oz (63.8 kg)  05/08/16 138 lb 12.8 oz (63 kg)    General appearance: alert, cooperative and appears stated age Ears: normal TM's and external ear canals both ears Throat: lips, mucosa, and tongue normal; teeth and gums normal Neck: no adenopathy, no carotid bruit, supple, symmetrical, trachea midline and thyroid not enlarged, symmetric, no tenderness/mass/nodules Back: symmetric, no curvature. ROM normal. No CVA tenderness. Lungs: clear to auscultation bilaterally Heart: regular rate and rhythm, S1, S2 normal, no murmur, click, rub or gallop Abdomen: soft, non-tender; bowel sounds normal; no masses,  no organomegaly Pulses: 2+ and symmetric Skin: Skin color, texture, turgor normal. No rashes or  lesions Lymph nodes: Cervical, supraclavicular, and axillary nodes normal. Neuro:  awake and interactive with normal mood and affect. Higher cortical functions are normal. Speech is clear without word-finding difficulty or dysarthria. Extraocular movements are intact. Visual fields of both eyes are grossly intact. Sensation to light touch is grossly intact bilaterally of upper and lower extremities. Motor examination shows 4+/5 symmetric hand grip and upper extremity  and 5/5 lower extremity strength. There is no pronation or drift. Gait is non-ataxic    No results found for: HGBA1C  Lab Results  Component Value Date   CREATININE 0.84 07/16/2016   CREATININE 0.75 06/20/2015   CREATININE 0.73 01/11/2015    Lab Results  Component Value Date   WBC 5.4 07/16/2016   HGB 12.9 07/16/2016   HCT 38.3 07/16/2016   PLT 276.0 07/16/2016   GLUCOSE 101 (H) 07/16/2016   CHOL 217 (H) 07/16/2016   TRIG 73.0 07/16/2016   HDL 71.80 07/16/2016   LDLDIRECT 132.0 01/11/2015   LDLCALC 131 (H) 07/16/2016   ALT 16 07/16/2016   AST 13 07/16/2016   NA 139 07/16/2016   K 4.6 07/16/2016   CL 106 07/16/2016   CREATININE 0.84 07/16/2016   BUN 12 07/16/2016   CO2 26 07/16/2016   TSH 0.704 09/05/2016    Mm Screening Breast Tomo Bilateral  Result Date: 01/11/2016 CLINICAL DATA:  Screening. EXAM: 2D DIGITAL SCREENING BILATERAL MAMMOGRAM WITH CAD AND ADJUNCT TOMO COMPARISON:  Previous exam(s). ACR Breast Density Category c: The breast tissue is heterogeneously dense, which may obscure small masses. FINDINGS: There are no findings suspicious for malignancy. Images were processed with CAD. IMPRESSION: No mammographic evidence of malignancy. A result letter of this screening mammogram will be mailed directly to the patient. RECOMMENDATION: Screening mammogram in one year. (Code:SM-B-01Y) BI-RADS CATEGORY  1: Negative. Electronically Signed   By: Claudie Revering M.D.   On: 01/11/2016 16:59    Assessment & Plan:   Problem List Items Addressed This Visit    Cervical radiculopathy at C5    Will treat inflammation with prednisone, tylenol and tramadol. If no improvement ,  MRI cervical spine         Hypothyroidism, acquired, autoimmune - Primary    Thyroid function was WNL in January   No current changes needed. Repeat TSH and T4  Every 3 months   Lab Results  Component Value Date   TSH 0.704 09/05/2016         Relevant Orders   T4 AND TSH      I am having Ms.  Vidas start on predniSONE and traMADol. I am also having her maintain her Vitamin D3, ketoconazole, doxycycline, fluticasone, levothyroxine, and levothyroxine.  Meds ordered this encounter  Medications  . predniSONE (DELTASONE) 10 MG tablet    Sig: 6 tablets daily x 3, then reduce by 1 tablet daily until gone    Dispense:  33 tablet    Refill:  0  . traMADol (ULTRAM) 50 MG tablet    Sig: Take 1 tablet (50 mg total) by mouth every 8 (eight) hours as needed.    Dispense:  60 tablet    Refill:  0  A total of 25 minutes of face to face time was spent with patient more than half of which was spent in counselling about the above mentioned conditions  and coordination of care   There are no discontinued medications.  Follow-up: No Follow-up on file.   [name], MD

## 2016-11-05 NOTE — Patient Instructions (Addendum)
For your shoulder pain , I am prescribing   Prednisone instead of motrin   6 tablets all at once on Days 1, 2 and 3,   1,  Then taper by 1 tablet daily  Until gone.   Ok to add up to 2000 mg tylenol daily in divided dose  Add tramadol for  Severe pain    If not resolved after 10 days,  Call to arrange MRI cervical spine     My personal trainer is [employer] [phone]

## 2016-11-07 DIAGNOSIS — M5412 Radiculopathy, cervical region: Secondary | ICD-10-CM | POA: Insufficient documentation

## 2016-11-07 NOTE — Assessment & Plan Note (Signed)
Thyroid function was WNL in January   No current changes needed. Repeat TSH and T4  Every 3 months   Lab Results  Component Value Date   TSH 0.704 09/05/2016

## 2016-11-07 NOTE — Assessment & Plan Note (Signed)
Will treat inflammation with prednisone, tylenol and tramadol. If no improvement ,  MRI cervical spine

## 2016-11-09 DIAGNOSIS — G44209 Tension-type headache, unspecified, not intractable: Secondary | ICD-10-CM | POA: Diagnosis not present

## 2016-11-09 DIAGNOSIS — M9901 Segmental and somatic dysfunction of cervical region: Secondary | ICD-10-CM | POA: Diagnosis not present

## 2016-11-09 DIAGNOSIS — M25512 Pain in left shoulder: Secondary | ICD-10-CM | POA: Diagnosis not present

## 2016-11-12 DIAGNOSIS — M25512 Pain in left shoulder: Secondary | ICD-10-CM | POA: Diagnosis not present

## 2016-11-12 DIAGNOSIS — G44209 Tension-type headache, unspecified, not intractable: Secondary | ICD-10-CM | POA: Diagnosis not present

## 2016-11-12 DIAGNOSIS — M9901 Segmental and somatic dysfunction of cervical region: Secondary | ICD-10-CM | POA: Diagnosis not present

## 2016-11-14 DIAGNOSIS — M9901 Segmental and somatic dysfunction of cervical region: Secondary | ICD-10-CM | POA: Diagnosis not present

## 2016-11-14 DIAGNOSIS — G44209 Tension-type headache, unspecified, not intractable: Secondary | ICD-10-CM | POA: Diagnosis not present

## 2016-11-14 DIAGNOSIS — M25512 Pain in left shoulder: Secondary | ICD-10-CM | POA: Diagnosis not present

## 2016-11-20 DIAGNOSIS — M25512 Pain in left shoulder: Secondary | ICD-10-CM | POA: Diagnosis not present

## 2016-11-20 DIAGNOSIS — M9901 Segmental and somatic dysfunction of cervical region: Secondary | ICD-10-CM | POA: Diagnosis not present

## 2016-11-20 DIAGNOSIS — G44209 Tension-type headache, unspecified, not intractable: Secondary | ICD-10-CM | POA: Diagnosis not present

## 2016-11-28 DIAGNOSIS — M25512 Pain in left shoulder: Secondary | ICD-10-CM | POA: Diagnosis not present

## 2016-11-28 DIAGNOSIS — G44209 Tension-type headache, unspecified, not intractable: Secondary | ICD-10-CM | POA: Diagnosis not present

## 2016-11-28 DIAGNOSIS — M9901 Segmental and somatic dysfunction of cervical region: Secondary | ICD-10-CM | POA: Diagnosis not present

## 2016-12-04 ENCOUNTER — Other Ambulatory Visit (INDEPENDENT_AMBULATORY_CARE_PROVIDER_SITE_OTHER): Payer: Federal, State, Local not specified - PPO

## 2016-12-04 DIAGNOSIS — E063 Autoimmune thyroiditis: Secondary | ICD-10-CM | POA: Diagnosis not present

## 2016-12-05 ENCOUNTER — Encounter: Payer: Self-pay | Admitting: Internal Medicine

## 2016-12-05 ENCOUNTER — Other Ambulatory Visit: Payer: Self-pay | Admitting: Internal Medicine

## 2016-12-05 DIAGNOSIS — M25512 Pain in left shoulder: Secondary | ICD-10-CM | POA: Diagnosis not present

## 2016-12-05 DIAGNOSIS — M9901 Segmental and somatic dysfunction of cervical region: Secondary | ICD-10-CM | POA: Diagnosis not present

## 2016-12-05 DIAGNOSIS — G44209 Tension-type headache, unspecified, not intractable: Secondary | ICD-10-CM | POA: Diagnosis not present

## 2016-12-05 DIAGNOSIS — E063 Autoimmune thyroiditis: Secondary | ICD-10-CM

## 2016-12-05 LAB — T4: T4 TOTAL: 11.1 ug/dL (ref 4.5–12.0)

## 2016-12-05 LAB — TSH: TSH: 0.66 mIU/L

## 2016-12-06 ENCOUNTER — Other Ambulatory Visit: Payer: Self-pay | Admitting: Internal Medicine

## 2016-12-06 DIAGNOSIS — E063 Autoimmune thyroiditis: Secondary | ICD-10-CM

## 2016-12-06 MED ORDER — LEVOTHYROXINE SODIUM 112 MCG PO TABS: ORAL_TABLET | ORAL | 0 refills | Status: DC

## 2016-12-19 DIAGNOSIS — G44209 Tension-type headache, unspecified, not intractable: Secondary | ICD-10-CM | POA: Diagnosis not present

## 2016-12-19 DIAGNOSIS — M9901 Segmental and somatic dysfunction of cervical region: Secondary | ICD-10-CM | POA: Diagnosis not present

## 2016-12-19 DIAGNOSIS — M25512 Pain in left shoulder: Secondary | ICD-10-CM | POA: Diagnosis not present

## 2016-12-20 ENCOUNTER — Other Ambulatory Visit: Payer: Self-pay | Admitting: Internal Medicine

## 2017-01-11 ENCOUNTER — Ambulatory Visit
Admission: RE | Admit: 2017-01-11 | Discharge: 2017-01-11 | Disposition: A | Discharge status: Home / Self Care | Payer: Federal, State, Local not specified - PPO | Source: Ambulatory Visit | Attending: Obstetrics & Gynecology | Admitting: Obstetrics & Gynecology

## 2017-01-11 DIAGNOSIS — Z1231 Encounter for screening mammogram for malignant neoplasm of breast: Secondary | ICD-10-CM

## 2017-01-14 ENCOUNTER — Encounter: Payer: Self-pay | Admitting: Obstetrics & Gynecology

## 2017-01-18 ENCOUNTER — Ambulatory Visit (INDEPENDENT_AMBULATORY_CARE_PROVIDER_SITE_OTHER): Payer: Federal, State, Local not specified - PPO | Admitting: Obstetrics & Gynecology

## 2017-01-18 ENCOUNTER — Encounter: Payer: Self-pay | Admitting: Obstetrics & Gynecology

## 2017-01-18 VITALS — BP 116/79 | Ht 68.0 in | Wt 138.8 lb

## 2017-01-18 DIAGNOSIS — Z01419 Encounter for gynecological examination (general) (routine) without abnormal findings: Secondary | ICD-10-CM | POA: Diagnosis not present

## 2017-01-18 DIAGNOSIS — Z113 Encounter for screening for infections with a predominantly sexual mode of transmission: Secondary | ICD-10-CM

## 2017-01-18 DIAGNOSIS — Z3009 Encounter for other general counseling and advice on contraception: Secondary | ICD-10-CM

## 2017-01-18 NOTE — Progress Notes (Signed)
Rebecca Cole 1961/06/08 [id number]   History:    56 y.o. G0 New boyfriend (not sexually active yet).  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  RP:  Established patient presenting for annual gyn exam   HPI:  Patient having reg menses qmonth, normal flow.  No dysmeno.  No pelvic pain.  Normal vaginal secretion.  Breasts wnl.  Mictions/BMs wnl.    Past medical history,surgical history, family history and social history were all reviewed and documented in the EPIC chart.  Gynecologic History Patient's last menstrual period was 12/24/2016. Contraception: vasectomy (previous boyfriend), currently abstinent Last Pap: 01/2016. Results were: normal (h/o Dysplasia, LEEP 1992) Last mammogram: 01/2017. Results were: normal Will repeat Colono this yr, normal 2013.  Father with Colon Ca.  Obstetric History OB History  Gravida Para Term Preterm AB Living  0 0 0 0 0 0  SAB TAB Ectopic Multiple Live Births  0 0 0 0 0         ROS: A ROS was performed and pertinent positives and negatives are included in the history.  GENERAL: No fevers or chills. HEENT: No change in vision, no earache, sore throat or sinus congestion. NECK: No pain or stiffness. CARDIOVASCULAR: No chest pain or pressure. No palpitations. PULMONARY: No shortness of breath, cough or wheeze. GASTROINTESTINAL: No abdominal pain, nausea, vomiting or diarrhea, melena or bright red blood per rectum. GENITOURINARY: No urinary frequency, urgency, hesitancy or dysuria. MUSCULOSKELETAL: No joint or muscle pain, no back pain, no recent trauma. DERMATOLOGIC: No rash, no itching, no lesions. ENDOCRINE: No polyuria, polydipsia, no heat or cold intolerance. No recent change in weight. HEMATOLOGICAL: No anemia or easy bruising or bleeding. NEUROLOGIC: No headache, seizures, numbness, tingling or weakness. PSYCHIATRIC: No depression, no loss of interest in normal activity or change in sleep pattern.      Exam:   BP 116/79   Ht 5\' 8"  (1.727 m)   Wt 138 lb 12.8 oz (63 kg)   LMP 12/24/2016   BMI 21.10 kg/m   Body mass index is 21.1 kg/m.  General appearance : Well developed well nourished female. No acute distress HEENT: Eyes: no retinal hemorrhage or exudates,  Neck supple, trachea midline, no carotid bruits, no thyroidmegaly Lungs: Clear to auscultation, no rhonchi or wheezes, or rib retractions  Heart: Regular rate and rhythm, no murmurs or gallops Breast:Examined in sitting and supine position were symmetrical in appearance, no palpable masses or tenderness,  no skin retraction, no nipple inversion, no nipple discharge, no skin discoloration, no axillary or supraclavicular lymphadenopathy Abdomen: no palpable masses or tenderness, no rebound or guarding Extremities: no edema or skin discoloration or tenderness  Pelvic:  Bartholin, Urethra, Skene Glands: Within normal limits             Vagina: No gross lesions or discharge  Cervix: No gross lesions or discharge.  Pap/HPV/Gono-Chlam done  Uterus  AV, normal size, shape and consistency, non-tender and mobile  Adnexa  Without masses or tenderness  Anus and perineum  normal   Assessment/Plan:  56 y.o. female for annual exam   1. Encounter for routine gynecological examination with Papanicolaou smear of cervix Normal Gyn exam.  Pap/HPV done.  Breast exam normal.  Recent screening mammo neg.  Colonoscopy this year.   3. Screen for STD (sexually transmitted disease) Gono-Chlam on pap. - HIV antibody - RPR - Hepatitis C Antibody - Hepatitis B Surface AntiGEN  4. Encounter for other general counseling  or advice on contraception Previous boyfriend had a vasectomy.  New boyfriend, unknown at this time.  Currently abstinent.  Contraception counseling done.  Paragard IUD, Progestin IUD, Nexplanon, BCPs, Nuvaring, Patch, DepoProvera reviewed in terms of Risks/Benefits/Insertion or usage.  Patient will call back if decides to  proceed with any of above.  Avoiding Estrogen containing contraception recommended.  Will use condoms/spermicides PRN in the meantime.  Counseling on above issues >50% x 10 minutes.  Princess Bruins MD, 3:18 PM 01/18/2017

## 2017-01-18 NOTE — Addendum Note (Signed)
Addended by: Thamas Jaegers on: 01/18/2017 04:22 PM   Modules accepted: Orders

## 2017-01-18 NOTE — Addendum Note (Signed)
Addended by: Burnett Kanaris on: 01/18/2017 04:31 PM   Modules accepted: Orders

## 2017-01-18 NOTE — Patient Instructions (Signed)
1. Encounter for routine gynecological examination with Papanicolaou smear of cervix Normal Gyn exam.  Pap/HPV done.  Breast exam normal.  Recent screening mammo neg.  Colonoscopy this year.   3. Screen for STD (sexually transmitted disease) Gono-Chlam on pap. - HIV antibody - RPR - Hepatitis C Antibody - Hepatitis B Surface AntiGEN  4. Encounter for other general counseling or advice on contraception Previous boyfriend had a vasectomy.  New boyfriend, unknown at this time.  Currently abstinent.  Contraception counseling done.  Paragard IUD, Progestin IUD, Nexplanon, BCPs, Nuvaring, Patch, DepoProvera reviewed in terms of Risks/Benefits/Insertion or usage.  Patient will call back if decides to proceed with any of above.  Avoiding Estrogen containing contraception recommended.  Will use condoms/spermicides PRN in the meantime.  Rebecca Cole, it was a pleasure to see you today!  I will inform you of your results as soon as available.

## 2017-01-19 LAB — HEPATITIS C ANTIBODY: HCV AB: NEGATIVE

## 2017-01-19 LAB — HEPATITIS B SURFACE ANTIGEN: Hepatitis B Surface Ag: NEGATIVE

## 2017-01-19 LAB — HIV ANTIBODY (ROUTINE TESTING W REFLEX): HIV 1&2 Ab, 4th Generation: NONREACTIVE

## 2017-01-19 LAB — RPR

## 2017-01-21 ENCOUNTER — Encounter: Payer: Self-pay | Admitting: *Deleted

## 2017-01-21 DIAGNOSIS — K080 Exfoliation of teeth due to systemic causes: Secondary | ICD-10-CM | POA: Diagnosis not present

## 2017-01-22 ENCOUNTER — Other Ambulatory Visit (INDEPENDENT_AMBULATORY_CARE_PROVIDER_SITE_OTHER): Payer: Federal, State, Local not specified - PPO

## 2017-01-22 DIAGNOSIS — E063 Autoimmune thyroiditis: Secondary | ICD-10-CM | POA: Diagnosis not present

## 2017-01-23 LAB — PAP IG, CT-NG NAA, HPV HIGH-RISK
CHLAMYDIA PROBE AMP: NOT DETECTED
GC PROBE AMP: NOT DETECTED
HPV DNA HIGH RISK: NOT DETECTED

## 2017-01-23 LAB — T4 AND TSH
T4, Total: 9.6 ug/dL (ref 4.5–12.0)
TSH: 0.491 u[IU]/mL (ref 0.450–4.500)

## 2017-01-24 ENCOUNTER — Encounter: Payer: Self-pay | Admitting: Internal Medicine

## 2017-01-25 ENCOUNTER — Encounter: Payer: Self-pay | Admitting: Internal Medicine

## 2017-01-25 ENCOUNTER — Ambulatory Visit (INDEPENDENT_AMBULATORY_CARE_PROVIDER_SITE_OTHER): Payer: Federal, State, Local not specified - PPO

## 2017-01-25 ENCOUNTER — Ambulatory Visit (INDEPENDENT_AMBULATORY_CARE_PROVIDER_SITE_OTHER): Payer: Federal, State, Local not specified - PPO | Admitting: Internal Medicine

## 2017-01-25 VITALS — BP 114/80 | HR 63 | Temp 97.9°F | Resp 14 | Ht 68.0 in | Wt 137.6 lb

## 2017-01-25 DIAGNOSIS — Z800 Family history of malignant neoplasm of digestive organs: Secondary | ICD-10-CM | POA: Diagnosis not present

## 2017-01-25 DIAGNOSIS — E063 Autoimmune thyroiditis: Secondary | ICD-10-CM | POA: Diagnosis not present

## 2017-01-25 DIAGNOSIS — G8929 Other chronic pain: Secondary | ICD-10-CM

## 2017-01-25 DIAGNOSIS — M25512 Pain in left shoulder: Secondary | ICD-10-CM | POA: Diagnosis not present

## 2017-01-25 DIAGNOSIS — Z1211 Encounter for screening for malignant neoplasm of colon: Secondary | ICD-10-CM

## 2017-01-25 DIAGNOSIS — M79622 Pain in left upper arm: Secondary | ICD-10-CM

## 2017-01-25 MED ORDER — DOXYCYCLINE HYCLATE 100 MG PO TABS: 100.0000 mg | ORAL_TABLET | Freq: Two times a day (BID) | ORAL | 3 refills | Status: DC

## 2017-01-25 MED ORDER — KETOCONAZOLE 2 % EX CREA: 1.0000 "application " | TOPICAL_CREAM | Freq: Every day | CUTANEOUS | 2 refills | Status: DC

## 2017-01-25 NOTE — Patient Instructions (Signed)
Xray today  to rule out bone spur in the humeral head  Referral to Charlann Boxer if no bone spur  Your cholesterol was excellent last visit,  10 yr risk of CAD is 3%  (LOW) SO NO NEED to repeat until 2019  Referral to Dr Collene Mares for colonoscopy is in process    Health Maintenance for Postmenopausal Women Menopause is a normal process in which your reproductive ability comes to an end. This process happens gradually over a span of months to years, usually between the ages of 66 and 56. Menopause is complete when you have missed 12 consecutive menstrual periods. It is important to talk with your health care provider about some of the most common conditions that affect postmenopausal women, such as heart disease, cancer, and bone loss (osteoporosis). Adopting a healthy lifestyle and getting preventive care can help to promote your health and wellness. Those actions can also lower your chances of developing some of these common conditions. What should I know about menopause? During menopause, you may experience a number of symptoms, such as:  Moderate-to-severe hot flashes.  Night sweats.  Decrease in sex drive.  Mood swings.  Headaches.  Tiredness.  Irritability.  Memory problems.  Insomnia.  Choosing to treat or not to treat menopausal changes is an individual decision that you make with your health care provider. What should I know about hormone replacement therapy and supplements? Hormone therapy products are effective for treating symptoms that are associated with menopause, such as hot flashes and night sweats. Hormone replacement carries certain risks, especially as you become older. If you are thinking about using estrogen or estrogen with progestin treatments, discuss the benefits and risks with your health care provider. What should I know about heart disease and stroke? Heart disease, heart attack, and stroke become more likely as you age. This may be due, in part, to the  hormonal changes that your body experiences during menopause. These can affect how your body processes dietary fats, triglycerides, and cholesterol. Heart attack and stroke are both medical emergencies. There are many things that you can do to help prevent heart disease and stroke:  Have your blood pressure checked at least every 1-2 years. High blood pressure causes heart disease and increases the risk of stroke.  If you are 35-60 years old, ask your health care provider if you should take aspirin to prevent a heart attack or a stroke.  Do not use any tobacco products, including cigarettes, chewing tobacco, or electronic cigarettes. If you need help quitting, ask your health care provider.  It is important to eat a healthy diet and maintain a healthy weight. ? Be sure to include plenty of vegetables, fruits, low-fat dairy products, and lean protein. ? Avoid eating foods that are high in solid fats, added sugars, or salt (sodium).  Get regular exercise. This is one of the most important things that you can do for your health. ? Try to exercise for at least 150 minutes each week. The type of exercise that you do should increase your heart rate and make you sweat. This is known as moderate-intensity exercise. ? Try to do strengthening exercises at least twice each week. Do these in addition to the moderate-intensity exercise.  Know your numbers.Ask your health care provider to check your cholesterol and your blood glucose. Continue to have your blood tested as directed by your health care provider.  What should I know about cancer screening? There are several types of cancer. Take the following  steps to reduce your risk and to catch any cancer development as early as possible. Breast Cancer  Practice breast self-awareness. ? This means understanding how your breasts normally appear and feel. ? It also means doing regular breast self-exams. Let your health care provider know about any changes,  no matter how small.  If you are 27 or older, have a clinician do a breast exam (clinical breast exam or CBE) every year. Depending on your age, family history, and medical history, it may be recommended that you also have a yearly breast X-ray (mammogram).  If you have a family history of breast cancer, talk with your health care provider about genetic screening.  If you are at high risk for breast cancer, talk with your health care provider about having an MRI and a mammogram every year.  Breast cancer (BRCA) gene test is recommended for women who have family members with BRCA-related cancers. Results of the assessment will determine the need for genetic counseling and BRCA1 and for BRCA2 testing. BRCA-related cancers include these types: ? Breast. This occurs in males or females. ? Ovarian. ? Tubal. This may also be called fallopian tube cancer. ? Cancer of the abdominal or pelvic lining (peritoneal cancer). ? Prostate. ? Pancreatic.  Cervical, Uterine, and Ovarian Cancer Your health care provider may recommend that you be screened regularly for cancer of the pelvic organs. These include your ovaries, uterus, and vagina. This screening involves a pelvic exam, which includes checking for microscopic changes to the surface of your cervix (Pap test).  For women ages 21-65, health care providers may recommend a pelvic exam and a Pap test every three years. For women ages 82-65, they may recommend the Pap test and pelvic exam, combined with testing for human papilloma virus (HPV), every five years. Some types of HPV increase your risk of cervical cancer. Testing for HPV may also be done on women of any age who have unclear Pap test results.  Other health care providers may not recommend any screening for nonpregnant women who are considered low risk for pelvic cancer and have no symptoms. Ask your health care provider if a screening pelvic exam is right for you.  If you have had past treatment  for cervical cancer or a condition that could lead to cancer, you need Pap tests and screening for cancer for at least 20 years after your treatment. If Pap tests have been discontinued for you, your risk factors (such as having a new sexual partner) need to be reassessed to determine if you should start having screenings again. Some women have medical problems that increase the chance of getting cervical cancer. In these cases, your health care provider may recommend that you have screening and Pap tests more often.  If you have a family history of uterine cancer or ovarian cancer, talk with your health care provider about genetic screening.  If you have vaginal bleeding after reaching menopause, tell your health care provider.  There are currently no reliable tests available to screen for ovarian cancer.  Lung Cancer Lung cancer screening is recommended for adults 12-34 years old who are at high risk for lung cancer because of a history of smoking. A yearly low-dose CT scan of the lungs is recommended if you:  Currently smoke.  Have a history of at least 30 pack-years of smoking and you currently smoke or have quit within the past 15 years. A pack-year is smoking an average of one pack of cigarettes per day for  one year.  Yearly screening should:  Continue until it has been 15 years since you quit.  Stop if you develop a health problem that would prevent you from having lung cancer treatment.  Colorectal Cancer  This type of cancer can be detected and can often be prevented.  Routine colorectal cancer screening usually begins at age 46 and continues through age 39.  If you have risk factors for colon cancer, your health care provider may recommend that you be screened at an earlier age.  If you have a family history of colorectal cancer, talk with your health care provider about genetic screening.  Your health care provider may also recommend using home test kits to check for hidden  blood in your stool.  A small camera at the end of a tube can be used to examine your colon directly (sigmoidoscopy or colonoscopy). This is done to check for the earliest forms of colorectal cancer.  Direct examination of the colon should be repeated every 5-10 years until age 26. However, if early forms of precancerous polyps or small growths are found or if you have a family history or genetic risk for colorectal cancer, you may need to be screened more often.  Skin Cancer  Check your skin from head to toe regularly.  Monitor any moles. Be sure to tell your health care provider: ? About any new moles or changes in moles, especially if there is a change in a mole's shape or color. ? If you have a mole that is larger than the size of a pencil eraser.  If any of your family members has a history of skin cancer, especially at a young age, talk with your health care provider about genetic screening.  Always use sunscreen. Apply sunscreen liberally and repeatedly throughout the day.  Whenever you are outside, protect yourself by wearing long sleeves, pants, a wide-brimmed hat, and sunglasses.  What should I know about osteoporosis? Osteoporosis is a condition in which bone destruction happens more quickly than new bone creation. After menopause, you may be at an increased risk for osteoporosis. To help prevent osteoporosis or the bone fractures that can happen because of osteoporosis, the following is recommended:  If you are 68-71 years old, get at least 1,000 mg of calcium and at least 600 mg of vitamin D per day.  If you are older than age 89 but younger than age 78, get at least 1,200 mg of calcium and at least 600 mg of vitamin D per day.  If you are older than age 12, get at least 1,200 mg of calcium and at least 800 mg of vitamin D per day.  Smoking and excessive alcohol intake increase the risk of osteoporosis. Eat foods that are rich in calcium and vitamin D, and do weight-bearing  exercises several times each week as directed by your health care provider. What should I know about how menopause affects my mental health? Depression may occur at any age, but it is more common as you become older. Common symptoms of depression include:  Low or sad mood.  Changes in sleep patterns.  Changes in appetite or eating patterns.  Feeling an overall lack of motivation or enjoyment of activities that you previously enjoyed.  Frequent crying spells.  Talk with your health care provider if you think that you are experiencing depression. What should I know about immunizations? It is important that you get and maintain your immunizations. These include:  Tetanus, diphtheria, and pertussis (Tdap) booster  vaccine.  Influenza every year before the flu season begins.  Pneumonia vaccine.  Shingles vaccine.  Your health care provider may also recommend other immunizations. This information is not intended to replace advice given to you by your health care provider. Make sure you discuss any questions you have with your health care provider. Document Released: 09/14/2005 Document Revised: 02/10/2016 Document Reviewed: 04/26/2015 Elsevier Interactive Patient Education  2018 [employer].

## 2017-01-25 NOTE — Progress Notes (Signed)
Subjective:  Patient ID: Rebecca Cole, female    DOB: 08/07/1960  Age: 56 y.o. MRN: [id number]  CC: The primary encounter diagnosis was Chronic left shoulder pain. Diagnoses of Screening for colon cancer, Family history of colon cancer requiring screening colonoscopy, Hypothyroidism, acquired, autoimmune, and Left upper arm pain were also pertinent to this visit.  HPI Rebecca Cole presents for annual follow up on nn gyn issues   Left shoulder pain  Has been persistent now for several months, despite treating the neck pain  .  Lateral arm,  Deltoid area  Aggravated by her activities including carpenty and canoing   Hypothyroidism iatrogenic secondary to Graves Disease:  Recent TSh reviewed . Energy level fine.   FRC risk 3%   Dermatology  Due for colonsocopy with Dr Collene Mares this year for  positive family history       Outpatient Medications Prior to Visit  Medication Sig Dispense Refill  . Cholecalciferol (VITAMIN D3) 1000 UNITS CAPS Take 1 capsule by mouth daily.      . fluticasone (FLONASE) 50 MCG/ACT nasal spray Place 2 sprays into both nostrils daily. 16 g 2  . levothyroxine (SYNTHROID, LEVOTHROID) 100 MCG tablet TAKE 1 TABLET (100 MCG TOTAL) BY MOUTH 6 DAYS PER WEEK (OTHER DAY WILL BE 112 MG) 90 tablet 1  . levothyroxine (SYNTHROID, LEVOTHROID) 112 MCG tablet Two days per week 32 tablet 0  . doxycycline (VIBRA-TABS) 100 MG tablet Take 1 tablet (100 mg total) by mouth 2 (two) times daily. (Patient not taking: Reported on 01/25/2017) 14 tablet 3  . ketoconazole (NIZORAL) 2 % cream Apply 1 application topically daily. (Patient not taking: Reported on 01/25/2017) 15 g 2  . predniSONE (DELTASONE) 10 MG tablet 6 tablets daily x 3, then reduce by 1 tablet daily until gone (Patient not taking: Reported on 01/18/2017) 33 tablet 0  . traMADol (ULTRAM) 50 MG tablet Take 1 tablet (50 mg total) by mouth every 8 (eight) hours as needed. (Patient not taking: Reported on 01/18/2017) 60 tablet 0   No  facility-administered medications prior to visit.     Review of Systems;  Patient denies headache, fevers, malaise, unintentional weight loss, skin rash, eye pain, sinus congestion and sinus pain, sore throat, dysphagia,  hemoptysis , cough, dyspnea, wheezing, chest pain, palpitations, orthopnea, edema, abdominal pain, nausea, melena, diarrhea, constipation, flank pain, dysuria, hematuria, urinary  Frequency, nocturia, numbness, tingling, seizures,  Focal weakness, Loss of consciousness,  Tremor, insomnia, depression, anxiety, and suicidal ideation.      Objective:  BP 114/80 (BP Location: Left Arm, Patient Position: Sitting, Cuff Size: Normal)   Pulse 63   Temp 97.9 F (36.6 C) (Oral)   Resp 14   Ht 5\' 8"  (1.727 m)   Wt 137 lb 9.6 oz (62.4 kg)   LMP 12/27/2016   SpO2 98%   BMI 20.92 kg/m   BP Readings from Last 3 Encounters:  01/25/17 114/80  01/18/17 116/79  11/05/16 110/82    Wt Readings from Last 3 Encounters:  01/25/17 137 lb 9.6 oz (62.4 kg)  01/18/17 138 lb 12.8 oz (63 kg)  11/05/16 147 lb 4 oz (66.8 kg)    General appearance: alert, cooperative and appears stated age Ears: normal TM's and external ear canals both ears Throat: lips, mucosa, and tongue normal; teeth and gums normal Neck: no adenopathy, no carotid bruit, supple, symmetrical, trachea midline and thyroid not enlarged, symmetric, no tenderness/mass/nodules Back: symmetric, no curvature. ROM normal. No CVA tenderness. Lungs: clear to  auscultation bilaterally Heart: regular rate and rhythm, S1, S2 normal, no murmur, click, rub or gallop Abdomen: soft, non-tender; bowel sounds normal; no masses,  no organomegaly Pulses: 2+ and symmetric Skin: Skin color, texture, turgor normal. No rashes or lesions Lymph nodes: Cervical, supraclavicular, and axillary nodes normal.  No results found for: HGBA1C  Lab Results  Component Value Date   CREATININE 0.84 07/16/2016   CREATININE 0.75 06/20/2015   CREATININE  0.73 01/11/2015    Lab Results  Component Value Date   WBC 5.4 07/16/2016   HGB 12.9 07/16/2016   HCT 38.3 07/16/2016   PLT 276.0 07/16/2016   GLUCOSE 101 (H) 07/16/2016   CHOL 217 (H) 07/16/2016   TRIG 73.0 07/16/2016   HDL 71.80 07/16/2016   LDLDIRECT 132.0 01/11/2015   LDLCALC 131 (H) 07/16/2016   ALT 16 07/16/2016   AST 13 07/16/2016   NA 139 07/16/2016   K 4.6 07/16/2016   CL 106 07/16/2016   CREATININE 0.84 07/16/2016   BUN 12 07/16/2016   CO2 26 07/16/2016   TSH 0.491 01/22/2017    Mm Screening Breast Tomo Bilateral  Result Date: 01/14/2017 CLINICAL DATA:  Screening. EXAM: 2D DIGITAL SCREENING BILATERAL MAMMOGRAM WITH CAD AND ADJUNCT TOMO COMPARISON:  Previous exam(s). ACR Breast Density Category c: The breast tissue is heterogeneously dense, which may obscure small masses. FINDINGS: There are no findings suspicious for malignancy. Images were processed with CAD. IMPRESSION: No mammographic evidence of malignancy. A result letter of this screening mammogram will be mailed directly to the patient. RECOMMENDATION: Screening mammogram in one year. (Code:SM-B-01Y) BI-RADS CATEGORY  1: Negative. Electronically Signed   By: Trude Mcburney M.D.   On: 01/14/2017 12:42    Assessment & Plan:   Problem List Items Addressed This Visit    Screening for colon cancer    Done in 2013  At age 46,  With Dr. Collene Mares follow up 5 years due to family history,  Referral in progress      Left upper arm pain    The pain is lateral , in the deltoid area,  Aggravated by abduction and flexion.  Suspected impingement syndrome, but plain films were negative for bone spurs . Referral to zach Tamala Julian discussed.        Hypothyroidism, acquired, autoimmune    Thyroid function is very active  On current dose.  No current changes needed.   Lab Results  Component Value Date   TSH 0.491 01/22/2017          Other Visit Diagnoses    Chronic left shoulder pain    -  Primary   Relevant Orders   DG  Shoulder Left (Completed)   Ambulatory referral to Sports Medicine   Family history of colon cancer requiring screening colonoscopy       Relevant Orders   Ambulatory referral to Gastroenterology      I have discontinued Ms. Spengler's predniSONE and traMADol. I am also having her maintain her Vitamin D3, fluticasone, levothyroxine, levothyroxine, ketoconazole, and doxycycline.  Meds ordered this encounter  Medications  . ketoconazole (NIZORAL) 2 % cream    Sig: Apply 1 application topically daily.    Dispense:  15 g    Refill:  2  . doxycycline (VIBRA-TABS) 100 MG tablet    Sig: Take 1 tablet (100 mg total) by mouth 2 (two) times daily.    Dispense:  14 tablet    Refill:  3    Replace previous one,   with refills  Medications Discontinued During This Encounter  Medication Reason  . predniSONE (DELTASONE) 10 MG tablet Patient has not taken in last 30 days  . traMADol (ULTRAM) 50 MG tablet Patient has not taken in last 30 days  . ketoconazole (NIZORAL) 2 % cream Reorder  . doxycycline (VIBRA-TABS) 100 MG tablet Reorder    Follow-up: No Follow-up on file.   [name], MD

## 2017-01-26 ENCOUNTER — Encounter: Payer: Self-pay | Admitting: Internal Medicine

## 2017-01-27 DIAGNOSIS — M79622 Pain in left upper arm: Secondary | ICD-10-CM | POA: Insufficient documentation

## 2017-01-27 NOTE — Assessment & Plan Note (Signed)
Thyroid function is very active  On current dose.  No current changes needed.   Lab Results  Component Value Date   TSH 0.491 01/22/2017

## 2017-01-27 NOTE — Assessment & Plan Note (Signed)
Done in 2013  At age 56,  With Dr. Collene Mares follow up 5 years due to family history,  Referral in progress

## 2017-01-27 NOTE — Assessment & Plan Note (Signed)
The pain is lateral , in the deltoid area,  Aggravated by abduction and flexion.  Suspected impingement syndrome, but plain films were negative for bone spurs . Referral to zach Tamala Julian discussed.

## 2017-02-19 ENCOUNTER — Encounter: Payer: Self-pay | Admitting: Family Medicine

## 2017-02-19 ENCOUNTER — Ambulatory Visit (INDEPENDENT_AMBULATORY_CARE_PROVIDER_SITE_OTHER): Payer: Federal, State, Local not specified - PPO | Admitting: Family Medicine

## 2017-02-19 ENCOUNTER — Ambulatory Visit: Payer: Self-pay

## 2017-02-19 VITALS — BP 108/78 | HR 69 | Ht 68.0 in | Wt 138.0 lb

## 2017-02-19 DIAGNOSIS — G8929 Other chronic pain: Secondary | ICD-10-CM

## 2017-02-19 DIAGNOSIS — E559 Vitamin D deficiency, unspecified: Secondary | ICD-10-CM

## 2017-02-19 DIAGNOSIS — M25512 Pain in left shoulder: Secondary | ICD-10-CM | POA: Diagnosis not present

## 2017-02-19 DIAGNOSIS — M7500 Adhesive capsulitis of unspecified shoulder: Secondary | ICD-10-CM | POA: Insufficient documentation

## 2017-02-19 DIAGNOSIS — M7502 Adhesive capsulitis of left shoulder: Secondary | ICD-10-CM | POA: Diagnosis not present

## 2017-02-19 MED ORDER — VITAMIN D (ERGOCALCIFEROL) 1.25 MG (50000 UNIT) PO CAPS: 50000.0000 [IU] | ORAL_CAPSULE | ORAL | 0 refills | Status: DC

## 2017-02-19 MED ORDER — DICLOFENAC SODIUM 2 % TD SOLN: 2.0000 "application " | Freq: Two times a day (BID) | TRANSDERMAL | 3 refills | Status: DC

## 2017-02-19 NOTE — Assessment & Plan Note (Signed)
Due to association with frozen shoulder we will increase vitamin D supplementation once weekly 50,000 units

## 2017-02-19 NOTE — Assessment & Plan Note (Signed)
Left frozen shoulder noted. HEP, pennsaid, once weekly vitaminD worked with ATC to learn hep  RTC in 4 weeks and if not better will consider injection and PT

## 2017-02-19 NOTE — Progress Notes (Signed)
Pre visit review using our clinic review tool, if applicable. No additional management support is needed unless otherwise documented below in the visit note. 

## 2017-02-19 NOTE — Patient Instructions (Signed)
Good to see you.  Ice 20 minutes 2 times daily. Usually after activity and before bed. Exercises 3 times a week.  pennsaid pinkie amount topically 2 times daily as needed.  Once weekly vitamin D for 12 weeks Keep hands within peripheral vision.  Over the counter Turmeric 500mg  2 times a day  See me again in 4 weeks if not better we will consider injection.

## 2017-02-19 NOTE — Progress Notes (Signed)
[facility] [place], [place] [address] Phone: [phone] Subjective:   Referred by [name], MD  CC: left shoulder pain  [id number]  Rebecca Cole is a 56 y.o. female coming in with complaint of left shoulder pain. Patient states that his been going on for months. Does not seem to be getting better. Since that certain movements causes a sharp pain and seems to go away. Patient denies any significant radiation down the arm or any numbness. Denies any weakness but has noticed increasing range of motion. Can wake her up at night rates the severity pain is 8 out of 10. Does respond well to over-the-counter medication sometimes.     Past Medical History:  Diagnosis Date  . Graves Disease Sept 2010   s/p 131 ablation, managed by [facility] medical assoicates Dr. Bubba Camp  . Graves' orbitopathy 2010   managed by [facility]   Past Surgical History:  Procedure Laterality Date  . BREAST BIOPSY  2006   negative   Social History   Social History  . Marital status: Single    Spouse name: N/A  . Number of children: N/A  . Years of education: N/A   Occupational History  . 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 Smoking status: Never Smoker  . Smokeless tobacco: Never Used  . Alcohol use 0.0 oz/week     Comment: moderate  . Drug use: No  . Sexual activity: Yes    Birth control/ protection: Other-see comments     Comment: VASTCOMY    Other Topics Concern  . 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 Methimazole    Family History  Problem Relation Age of Onset  . Heart disease Father        CABG, Ablation for Atrial Flutter, valve replacement, pacemaker, pericardial effusion s/p window  . Cancer Father        late 77's history of colon CA  . Cancer Maternal Grandmother        possibly  benign  . Breast cancer Maternal Grandmother     Past medical history, social, surgical and family history all reviewed in electronic medical record.  No pertanent information unless stated regarding to the chief complaint.   Review of Systems:Review of systems updated and as accurate as of 02/19/17  No headache, visual changes, nausea, vomiting, diarrhea, constipation, dizziness, abdominal pain, skin rash, fevers, chills, night sweats, weight loss, swollen lymph nodes, body aches, joint swelling, muscle aches, chest pain, shortness of breath, mood changes.   Objective  Blood pressure 108/78, pulse 69, height 5\' 8"  (1.727 m), weight 138 lb (62.6 kg), SpO2 100 %. Systems examined below as of 02/19/17   General: No apparent distress alert and oriented x3 mood and affect normal, dressed appropriately.  HEENT: Pupils equal, extraocular movements intact  Respiratory: Patient's speak in full sentences and does not appear short of breath  Cardiovascular: No lower extremity edema, non tender, no erythema  Skin: Warm dry intact with no signs of infection or rash on extremities or on axial skeleton.  Abdomen: Soft nontender  Neuro: Cranial nerves II through XII are intact, neurovascularly intact in all extremities with 2+ DTRs and 2+ pulses.  Lymph: No lymphadenopathy of posterior or anterior cervical chain or axillae bilaterally.  Gait normal with good  balance and coordination.  MSK:  Non tender with full range of motion and good stability and symmetric strength and tone of  elbows, wrist, hip, knee and ankles bilaterally.  Shoulder:left  Inspection reveals no abnormalities, atrophy or asymmetry. Palpation is normal with no tenderness over AC joint or bicipital groove. Decrease 160 ff, external rotation of 10 deg and internal to lat hip Rotator cuff strength normal throughout. + impingement Speeds and Yergason's tests normal. No labral pathology noted with negative Obrien's, negative clunk and  good stability.  Normal scapular function observed. No painful arc and no drop arm sign. No apprehension sign Contralateral shoulder unremarkable.   MSK US performed of: left  This study was ordered, performed, and interpreted by Charlann Boxer D.O.  Shoulder:   Supraspinatus:  Appears normal on long and transverse views, no bursal bulge seen with shoulder abduction on impingement view. Infraspinatus:  Appears normal on long and transverse views. Subscapularis:  Appears normal on long and transverse views. Teres Minor:  Appears normal on long and transverse views. AC joint:  Capsule undistended, no geyser sign. Glenohumeral Joint:  Appears normal without effusion. Glenoid Labrum:  Intact without visualized tears. Biceps Tendon:  Appears normal on long and transverse views, no fraying of tendon, tendon located in intertubercular groove, no subluxation with shoulder internal or external rotation. No increased power doppler signal. Impression normal shoulder   Procedure note 97110; 15 minutes spent for Therapeutic exercises as stated in above notes.  This included exercises focusing on stretching, strengthening, with significant focus on eccentric aspects.  Shoulder Exercises that included:  Basic scapular stabilization to include adduction and depression of scapula Scaption, focusing on proper movement and good control Internal and External rotation utilizing a theraband, with elbow tucked at side entire time Rows with theraband which was given today   Proper technique shown and discussed handout in great detail with ATC.  All questions were discussed and answered.         Impression and Recommendations:     This case required medical decision making of moderate complexity.      Note: This dictation was prepared with Dragon dictation along with smaller phrase technology. Any transcriptional errors that result from this process are unintentional.

## 2017-03-19 ENCOUNTER — Ambulatory Visit (INDEPENDENT_AMBULATORY_CARE_PROVIDER_SITE_OTHER): Payer: Federal, State, Local not specified - PPO | Admitting: Family Medicine

## 2017-03-19 ENCOUNTER — Ambulatory Visit: Payer: Self-pay

## 2017-03-19 ENCOUNTER — Encounter: Payer: Self-pay | Admitting: Family Medicine

## 2017-03-19 VITALS — BP 126/72 | HR 66 | Ht 68.0 in | Wt 138.0 lb

## 2017-03-19 DIAGNOSIS — M25512 Pain in left shoulder: Secondary | ICD-10-CM | POA: Diagnosis not present

## 2017-03-19 DIAGNOSIS — M7502 Adhesive capsulitis of left shoulder: Secondary | ICD-10-CM | POA: Diagnosis not present

## 2017-03-19 NOTE — Assessment & Plan Note (Signed)
Patient given injection today and tolerated procedure well. We also will start with the possibility of formal physical therapy. Discussed vitamin D supplementation, icing regimen, which activities doing which was to avoid. Encourage patient to increase activity and range of motion. Follow-up again in 4-6 weeks

## 2017-03-19 NOTE — Patient Instructions (Signed)
Good to see you  PT will be calling you try it again 1-2 times  Injected today  Take today easy and then back at the exercises I want you to keep pushing it and moving Continue the vitamin D See me again in 4-6 weeks

## 2017-03-19 NOTE — Progress Notes (Signed)
[facility] [place], [place] [address] Phone: [phone] Subjective:   Referred by [name], MD   CC: left shoulder pain f/u  [id number]  Rebecca Cole is a 56 y.o. female coming in with complaint of left shoulder pain. Found have frozen shoulder. Patient since then has improved in pain by approximately 20% but has not made any significant improvement with range of motion. Patient states that the pain is fairly severe. Patient states it is not waking her up as much but some type movements causes worsening discomfort and pain.     Past Medical History:  Diagnosis Date  . Graves Disease Sept 2010   s/p 131 ablation, managed by [facility] medical assoicates Dr. Bubba Camp  . Graves' orbitopathy 2010   managed by [facility]   Past Surgical History:  Procedure Laterality Date  . BREAST BIOPSY  2006   negative   Social History   Social History  . Marital status: Single    Spouse name: N/A  . Number of children: N/A  . Years of education: N/A   Occupational History  . 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 Smoking status: Never Smoker  . Smokeless tobacco: Never Used  . Alcohol use 0.0 oz/week     Comment: moderate  . Drug use: No  . Sexual activity: Yes    Birth control/ protection: Other-see comments     Comment: VASTCOMY    Other Topics Concern  . 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 Methimazole    Family History  Problem Relation Age of Onset  . Heart disease Father        CABG, Ablation for Atrial Flutter, valve replacement, pacemaker, pericardial effusion s/p window  . Cancer Father        late 61's history of colon CA  . Cancer Maternal Grandmother        possibly benign  . Breast cancer Maternal Grandmother     Past medical history, social, surgical and  family history all reviewed in electronic medical record.  No pertanent information unless stated regarding to the chief complaint.   Review of Systems: No headache, visual changes, nausea, vomiting, diarrhea, constipation, dizziness, abdominal pain, skin rash, fevers, chills, night sweats, weight loss, swollen lymph nodes, body aches, joint swelling, muscle aches, chest pain, shortness of breath, mood changes.   Objective  Blood pressure 126/72, pulse 66, height 5\' 8"  (1.727 m), weight 138 lb (62.6 kg), SpO2 98 %.   Systems examined below as of 03/19/17 General: NAD A&O x3 mood, affect normal  HEENT: Pupils equal, extraocular movements intact no nystagmus Respiratory: not short of breath at rest or with speaking Cardiovascular: No lower extremity edema, non tender Skin: Warm dry intact with no signs of infection or rash on extremities or on axial skeleton. Abdomen: Soft nontender, no masses Neuro: Cranial nerves  intact, neurovascularly intact in all extremities with 2+ DTRs and 2+ pulses. Lymph: No lymphadenopathy appreciated today  Gait normal with good balance and coordination.  MSK: Non tender with full range of motion and good stability and symmetric strength and tone of , elbows, wrist,  knee hips and ankles bilaterally.   Shoulder: Inspection reveals no abnormalities, atrophy or asymmetry. Palpation is normal with no tenderness over AC joint or bicipital groove.  165 of forward flexion, minimal external rotation, internal rotation to sacrum Rotator cuff strength normal throughout. I'll impingement Speeds and Yergason's tests normal. No labral pathology noted with negative Obrien's, negative clunk and good stability. Normal scapular function observed. No painful arc and no drop arm sign. No apprehension sign Contralateral shoulder unremarkable   Procedure: Real-time Ultrasound Guided Injection of left glenohumeral joint Device: GE Logiq E  Ultrasound guided injection is  preferred based studies that show increased duration, increased effect, greater accuracy, decreased procedural pain, increased response rate with ultrasound guided versus blind injection.  Verbal informed consent obtained.  Time-out conducted.  Noted no overlying erythema, induration, or other signs of local infection.  Skin prepped in a sterile fashion.  Local anesthesia: Topical Ethyl chloride.  With sterile technique and under real time ultrasound guidance:  Joint visualized.  21g 2 inch needle inserted posterior approach. Pictures taken for needle placement. Patient did have injection of 2 cc of 0.5% Marcaine, and 1cc of Kenalog 40 mg/dL. Completed without difficulty  Pain immediately resolved suggesting accurate placement of the medication.  Advised to call if fevers/chills, erythema, induration, drainage, or persistent bleeding.  Images permanently stored and available for review in the ultrasound unit.  Impression: Technically successful ultrasound guided injection.        Impression and Recommendations:     This case required medical decision making of moderate complexity.      Note: This dictation was prepared with Dragon dictation along with smaller phrase technology. Any transcriptional errors that result from this process are unintentional.

## 2017-03-26 ENCOUNTER — Other Ambulatory Visit: Payer: Self-pay | Admitting: *Deleted

## 2017-03-26 DIAGNOSIS — M25512 Pain in left shoulder: Secondary | ICD-10-CM | POA: Diagnosis not present

## 2017-03-26 DIAGNOSIS — M7502 Adhesive capsulitis of left shoulder: Secondary | ICD-10-CM

## 2017-03-29 DIAGNOSIS — M25512 Pain in left shoulder: Secondary | ICD-10-CM | POA: Diagnosis not present

## 2017-04-01 ENCOUNTER — Other Ambulatory Visit: Payer: Self-pay | Admitting: Internal Medicine

## 2017-04-01 DIAGNOSIS — E063 Autoimmune thyroiditis: Secondary | ICD-10-CM

## 2017-04-02 DIAGNOSIS — M25512 Pain in left shoulder: Secondary | ICD-10-CM | POA: Diagnosis not present

## 2017-04-17 DIAGNOSIS — M25512 Pain in left shoulder: Secondary | ICD-10-CM | POA: Diagnosis not present

## 2017-04-18 ENCOUNTER — Telehealth: Payer: Self-pay | Admitting: Internal Medicine

## 2017-04-18 DIAGNOSIS — E039 Hypothyroidism, unspecified: Secondary | ICD-10-CM

## 2017-04-18 NOTE — Telephone Encounter (Signed)
Pt called and stated that she needs to have her TSH check. Pt states that she has been having it check every 3-4 months. Please advise, thank you!  Call pt @ [phone]

## 2017-04-18 NOTE — Telephone Encounter (Signed)
Pt's TSH was normal in June. Does she need to come in to have it checked again this month?

## 2017-04-18 NOTE — Telephone Encounter (Signed)
Yes, she prefers to keep a close check on it every 3 months. , ordered

## 2017-04-18 NOTE — Telephone Encounter (Signed)
Spoke with pt and scheduled her a lab appt. Pt is aware of appt date and time.  

## 2017-04-22 ENCOUNTER — Other Ambulatory Visit (INDEPENDENT_AMBULATORY_CARE_PROVIDER_SITE_OTHER): Payer: Federal, State, Local not specified - PPO

## 2017-04-22 DIAGNOSIS — E039 Hypothyroidism, unspecified: Secondary | ICD-10-CM

## 2017-04-23 ENCOUNTER — Encounter: Payer: Self-pay | Admitting: Family Medicine

## 2017-04-23 ENCOUNTER — Ambulatory Visit (INDEPENDENT_AMBULATORY_CARE_PROVIDER_SITE_OTHER): Payer: Federal, State, Local not specified - PPO | Admitting: Family Medicine

## 2017-04-23 DIAGNOSIS — M7502 Adhesive capsulitis of left shoulder: Secondary | ICD-10-CM

## 2017-04-23 LAB — T4, FREE: Free T4: 1.27 ng/dL (ref 0.60–1.60)

## 2017-04-23 LAB — TSH: TSH: 0.84 u[IU]/mL (ref 0.35–4.50)

## 2017-04-23 NOTE — Patient Instructions (Signed)
You are awesome Keep it up  See me again in 6-8 weeks if not perfect for one more injection.

## 2017-04-23 NOTE — Progress Notes (Signed)
[facility] [place], [place] [address] Phone: [phone] Subjective:   Referred by [name], MD   CC: left shoulder pain f/u  [id number]  Rebecca Cole is a 56 y.o. female coming in with complaint of left shoulder pain. Found have frozen shoulder. Patient was seen by me and was diagnosed with a frozen shoulder. Patient has done much better after the injection. States about 80% better. Has started increasing range of motion. Has done physical therapy been doing well.    Past Medical History:  Diagnosis Date  . Graves Disease Sept 2010   s/p 131 ablation, managed by [facility] medical assoicates Dr. Bubba Camp  . Graves' orbitopathy 2010   managed by [facility]   Past Surgical History:  Procedure Laterality Date  . BREAST BIOPSY  2006   negative   Social History   Social History  . Marital status: Single    Spouse name: N/A  . Number of children: N/A  . Years of education: N/A   Occupational History  . 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 Smoking status: Never Smoker  . Smokeless tobacco: Never Used  . Alcohol use 0.0 oz/week     Comment: moderate  . Drug use: No  . Sexual activity: Yes    Birth control/ protection: Other-see comments     Comment: VASTCOMY    Other Topics Concern  . 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 Methimazole    Family History  Problem Relation Age of Onset  . Heart disease Father        CABG, Ablation for Atrial Flutter, valve replacement, pacemaker, pericardial effusion s/p window  . Cancer Father        late 2's history of colon CA  . Cancer Maternal Grandmother        possibly benign  . Breast cancer Maternal Grandmother     Past medical history, social, surgical and family history all reviewed in electronic medical record.  No  pertanent information unless stated regarding to the chief complaint.   Review of Systems: No headache, visual changes, nausea, vomiting, diarrhea, constipation, dizziness, abdominal pain, skin rash, fevers, chills, night sweats, weight loss, swollen lymph nodes, body aches, joint swelling, muscle aches, chest pain, shortness of breath, mood changes.    Objective  Blood pressure 104/68, pulse 60, height 5\' 8"  (1.727 m), weight 138 lb (62.6 kg), SpO2 98 %.   Systems examined below as of 04/23/17 General: NAD A&O x3 mood, affect normal  HEENT: Pupils equal, extraocular movements intact no nystagmus Respiratory: not short of breath at rest or with speaking Cardiovascular: No lower extremity edema, non tender Skin: Warm dry intact with no signs of infection or rash on extremities or on axial skeleton. Abdomen: Soft nontender, no masses Neuro: Cranial nerves  intact, neurovascularly intact in all extremities with 2+ DTRs and 2+ pulses. Lymph: No lymphadenopathy appreciated today  Gait normal with good balance and coordination.  MSK: Non tender with full range of motion and good stability and symmetric strength and tone of  elbows, wrist,  knee hips and ankles bilaterally.  .   Shoulder: Left Inspection reveals no abnormalities, atrophy or asymmetry. Palpation is normal with no tenderness over AC joint or bicipital groove. Still lacking Rotation but significant improvement with full  forward flexion Rotator cuff strength normal throughout. No signs of impingement with negative Neer and Hawkin's tests, empty can sign. Speeds and Yergason's tests normal. No labral pathology noted with negative Obrien's, negative clunk and good stability. Normal scapular function observed. No painful arc and no drop arm sign. No apprehension sign        Impression and Recommendations:     This case required medical decision making of moderate complexity.      Note: This dictation was prepared with  Dragon dictation along with smaller phrase technology. Any transcriptional errors that result from this process are unintentional.

## 2017-04-23 NOTE — Assessment & Plan Note (Signed)
Doing much better at this time. Encourage patient to continue the home exercises, icing regimen, which activities doing which ones to avoid. Patient will come back and see me again in 6-8 weeks if not completely resolved. Discontinue physical therapy.

## 2017-04-24 ENCOUNTER — Encounter: Payer: Self-pay | Admitting: Internal Medicine

## 2017-04-29 DIAGNOSIS — M25512 Pain in left shoulder: Secondary | ICD-10-CM | POA: Diagnosis not present

## 2017-05-07 ENCOUNTER — Other Ambulatory Visit: Payer: Self-pay | Admitting: Internal Medicine

## 2017-05-07 DIAGNOSIS — Z800 Family history of malignant neoplasm of digestive organs: Secondary | ICD-10-CM | POA: Diagnosis not present

## 2017-05-07 DIAGNOSIS — Z1211 Encounter for screening for malignant neoplasm of colon: Secondary | ICD-10-CM | POA: Diagnosis not present

## 2017-06-04 ENCOUNTER — Ambulatory Visit: Payer: Federal, State, Local not specified - PPO | Admitting: Family Medicine

## 2017-06-08 ENCOUNTER — Other Ambulatory Visit: Payer: Self-pay | Admitting: Family Medicine

## 2017-06-08 LAB — HM COLONOSCOPY

## 2017-06-10 DIAGNOSIS — K5730 Diverticulosis of large intestine without perforation or abscess without bleeding: Secondary | ICD-10-CM | POA: Diagnosis not present

## 2017-06-10 DIAGNOSIS — K635 Polyp of colon: Secondary | ICD-10-CM | POA: Diagnosis not present

## 2017-06-10 DIAGNOSIS — Z1211 Encounter for screening for malignant neoplasm of colon: Secondary | ICD-10-CM | POA: Diagnosis not present

## 2017-06-10 DIAGNOSIS — D127 Benign neoplasm of rectosigmoid junction: Secondary | ICD-10-CM | POA: Diagnosis not present

## 2017-06-10 DIAGNOSIS — Z800 Family history of malignant neoplasm of digestive organs: Secondary | ICD-10-CM | POA: Diagnosis not present

## 2017-06-10 NOTE — Telephone Encounter (Signed)
Refill done.  

## 2017-07-16 DIAGNOSIS — H2589 Other age-related cataract: Secondary | ICD-10-CM | POA: Diagnosis not present

## 2017-07-16 DIAGNOSIS — E0500 Thyrotoxicosis with diffuse goiter without thyrotoxic crisis or storm: Secondary | ICD-10-CM | POA: Diagnosis not present

## 2017-07-26 ENCOUNTER — Other Ambulatory Visit: Payer: Self-pay | Admitting: Internal Medicine

## 2017-07-26 DIAGNOSIS — E063 Autoimmune thyroiditis: Secondary | ICD-10-CM

## 2017-08-12 DIAGNOSIS — K080 Exfoliation of teeth due to systemic causes: Secondary | ICD-10-CM | POA: Diagnosis not present

## 2017-08-19 NOTE — Progress Notes (Signed)
[facility] [place], [place] [address] Phone: [phone] Subjective:    CC: Knee pain  [id number]  Rebecca Cole is a 57 y.o. female coming in with complaint of left knee pain. She has been having pain over the lateral portion of the left knee for the past month. She stepped in the dark and twisted her knee which caused her to change her gait. She said that most of her pain is on the patellar tendon and medial to the tendon after she twisted her knee. She said that she straightened her knee and this caused the knee to go back to normal. Denies any problems with stairs or any catching of her knee at other times.   She also complains of neck pain that occurs when she carries her kayak on her right shoulder. She said her neck bothers her with rotation to the left side. She does get headaches from the pain and has been treated by a chiropractor for this issue.    Past Medical History:  Diagnosis Date  . Graves Disease Sept 2010   s/p 131 ablation, managed by [facility] medical assoicates Dr. Bubba Camp  . Graves' orbitopathy 2010   managed by [facility]   Past Surgical History:  Procedure Laterality Date  . BREAST BIOPSY  2006   negative   Social History   Socioeconomic History  . Marital status: Single    Spouse name: None  . Number of children: None  . Years of education: None  . Highest education level: None  Social Needs  . Financial resource strain: None  . Food insecurity - worry: None  . Food insecurity - inability: None  . Transportation needs - medical: None  . Transportation needs - non-medical: None  Occupational History  . Occupation: full-time Programmer, applications: [employer]  Tobacco Use  . Smoking status: Never Smoker  . Smokeless tobacco: Never Used  Substance and Sexual Activity  . Alcohol use: Yes    Alcohol/week: 0.0 oz    Comment: moderate  . Drug use: No  . Sexual activity: Yes   Birth control/protection: Other-see comments    Comment: VASTCOMY   Other Topics Concern  . 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 Methimazole    Family History  Problem Relation Age of Onset  . Heart disease Father        CABG, Ablation for Atrial Flutter, valve replacement, pacemaker, pericardial effusion s/p window  . Cancer Father        late 58's history of colon CA  . Cancer Maternal Grandmother        possibly benign  . Breast cancer Maternal Grandmother      Past medical history, social, surgical and family history all reviewed in electronic medical record.  No pertanent information unless stated regarding to the chief complaint.   Review of Systems:Review of systems updated and as accurate as of 08/20/17  No headache, visual changes, nausea, vomiting, diarrhea, constipation, dizziness, abdominal pain, skin rash, fevers, chills, night sweats, weight loss, swollen lymph nodes, body aches, joint swelling, chest pain, shortness of breath, mood changes.  Positive muscle aches  Objective  Blood pressure 110/72, pulse 63, height 5' 8.5" (1.74 m), weight 143 lb (64.9 kg), last menstrual period 08/16/2017, SpO2 98 %. Systems examined below as of 08/20/17  General: No apparent distress alert and oriented x3 mood and affect normal, dressed appropriately.  HEENT: Pupils equal, extraocular movements intact  Respiratory: Patient's speak in full sentences and does not appear short of breath  Cardiovascular: No lower extremity edema, non tender, no erythema  Skin: Warm dry intact with no signs of infection or rash on extremities or on axial skeleton.  Abdomen: Soft nontender  Neuro: Cranial nerves II through XII are intact, neurovascularly intact in all extremities with 2+ DTRs and 2+ pulses.  Lymph: No lymphadenopathy of posterior or anterior cervical chain or axillae bilaterally.    Gait normal with good balance and coordination.  MSK:  Non tender with full range of motion and good stability and symmetric strength and tone of  elbows, wrist, hip and ankles  bilaterally.  Neck: Inspection mild loss of lordosis with mild increase in kyphosis of the upper back. No palpable stepoffs. Negative Spurling's maneuver. Mild limitation with left-sided rotation Grip strength and sensation normal in bilateral hands Strength good C4 to T1 distribution No sensory change to C4 to T1 Negative Hoffman sign bilaterally Reflexes normal Knee: Left Normal to inspection with no erythema or effusion or obvious bony abnormalities. Palpation normal with no warmth, joint line tenderness, patellar tenderness, or condyle tenderness.  Pain over the insertion of the hamstring on the medial aspect of the knee ROM full in flexion and extension and lower leg rotation. Ligaments with solid consistent endpoints including ACL, PCL, LCL, MCL. Negative Mcmurray's, Apley's, and Thessalonian tests. Non painful patellar compression. Patellar glide without crepitus. Patellar and quadriceps tendons unremarkable. Hamstring and quadriceps strength is normal. Severe pes planus bilaterally  Osteopathic findings C2 flexed rotated and side bent right C4 flexed rotated and side bent left C7 flexed rotated and side bent left T3 extended rotated and side bent right inhaled third rib T6 extended rotated and side bent left L3 flexed rotated and side bent right Sacrum right on right     Impression and Recommendations:     This case required medical decision making of moderate complexity.      Note: This dictation was prepared with Dragon dictation along with smaller phrase technology. Any transcriptional errors that result from this process are unintentional.

## 2017-08-20 ENCOUNTER — Ambulatory Visit: Payer: Self-pay

## 2017-08-20 ENCOUNTER — Ambulatory Visit (INDEPENDENT_AMBULATORY_CARE_PROVIDER_SITE_OTHER)
Admission: RE | Admit: 2017-08-20 | Discharge: 2017-08-20 | Disposition: A | Discharge status: Home / Self Care | Payer: Federal, State, Local not specified - PPO | Source: Ambulatory Visit | Attending: Family Medicine | Admitting: Family Medicine

## 2017-08-20 ENCOUNTER — Encounter: Payer: Self-pay | Admitting: Family Medicine

## 2017-08-20 ENCOUNTER — Ambulatory Visit: Payer: Federal, State, Local not specified - PPO | Admitting: Family Medicine

## 2017-08-20 VITALS — BP 110/72 | HR 63 | Ht 68.5 in | Wt 143.0 lb

## 2017-08-20 DIAGNOSIS — M2140 Flat foot [pes planus] (acquired), unspecified foot: Secondary | ICD-10-CM

## 2017-08-20 DIAGNOSIS — G8929 Other chronic pain: Secondary | ICD-10-CM

## 2017-08-20 DIAGNOSIS — M999 Biomechanical lesion, unspecified: Secondary | ICD-10-CM

## 2017-08-20 DIAGNOSIS — M25562 Pain in left knee: Secondary | ICD-10-CM

## 2017-08-20 DIAGNOSIS — M47812 Spondylosis without myelopathy or radiculopathy, cervical region: Secondary | ICD-10-CM | POA: Diagnosis not present

## 2017-08-20 DIAGNOSIS — M179 Osteoarthritis of knee, unspecified: Secondary | ICD-10-CM | POA: Diagnosis not present

## 2017-08-20 DIAGNOSIS — M5412 Radiculopathy, cervical region: Secondary | ICD-10-CM | POA: Diagnosis not present

## 2017-08-20 NOTE — Assessment & Plan Note (Signed)
No radicular symptoms at this time.  Started osteopathic manipulation.  Responded very well to this.  Given home exercises working on scapular exercises and strengthening.

## 2017-08-20 NOTE — Assessment & Plan Note (Signed)
Severe hindfoot and and midfoot.   RTC in 4 weeks

## 2017-08-20 NOTE — Assessment & Plan Note (Signed)
Decision today to treat with OMT was based on Physical Exam  After verbal consent patient was treated with HVLA, ME, FPR techniques in cervical, thoracic, lumbar and sacral areas  Patient tolerated the procedure well with improvement in symptoms  Patient given exercises, stretches and lifestyle modifications  See medications in patient instructions if given  Patient will follow up in 4-6 weeks 

## 2017-08-20 NOTE — Patient Instructions (Signed)
Good to see you  Ice of the knee. Ice 20 minutes 2 times daily. Usually after activity and before bed. pennsaid pinkie amount topically 2 times daily as needed.  Exercises 3 times a week  We will get you orthotics.  Avoid being barefoot.  [email or web address].[email or web address] for the diet  Vega sport powder 1-2 times  Daily  See me again in 4 weeks

## 2017-09-08 NOTE — Progress Notes (Signed)
Procedure Note   Patient was fitted for a : standard, cushioned, semi-rigid orthotic. The orthotic was heated and afterward the patient was in a seated position and the orthotic molded. The patient was positioned in subtalar neutral position and 10 degrees of ankle dorsiflexion in a non-weight bearing stance. After completion of molding, patient did have orthotic management which included instructions on acclimating to the orthotics, signs of ill fit as well as care for the orthotic.   The blank was ground to a stable position for weight bearing. Size: 9 Base: Carbon fiber Additional Posting and Padding: Bilateral 250/35, 300/140, 300/120 x2, 270/90 The following postings were fitted onto the molded orthotics to help maintain a talar neutral position - Wedge posting for transverse arch:        Silicone posting for longitudinal arch: 1 The patient ambulated these, and they were very comfortable and supportive.

## 2017-09-09 ENCOUNTER — Ambulatory Visit: Payer: Federal, State, Local not specified - PPO | Admitting: Family Medicine

## 2017-09-09 ENCOUNTER — Encounter: Payer: Self-pay | Admitting: Family Medicine

## 2017-09-09 DIAGNOSIS — M2140 Flat foot [pes planus] (acquired), unspecified foot: Secondary | ICD-10-CM | POA: Diagnosis not present

## 2017-09-09 NOTE — Assessment & Plan Note (Signed)
Severe with posterior tibialis insufficiency.  Patient is had significant stability and into the medial arch.  We discussed icing regimen and home exercises.  Patient will follow-up again in 2 weeks.  May need to make adjustments over the course of time.

## 2017-09-09 NOTE — Patient Instructions (Signed)
Good to see you   

## 2017-09-16 NOTE — Progress Notes (Signed)
[facility] [address], [place] [address] Phone: [phone] Subjective:    I'm seeing this patient by the request  of:    CC: Knee pain and back pain follow-up  JJO:[id number]  Rebecca Cole is a 57 y.o. female coming in with complaint of knee pain.  Seem to be more secondary to the alignment of the foot.  Patient did have a contusion.  Patient was put in custom orthotics.  Patient states making progress at this time.  Noticing not as much tenderness and not as much fatigue at the end of the day.  Happy with the results so far.  Patient was also having back pain.  Responded well to osteopathic manipulation.  Patient states she has hamstring tendon pain with knee flexion.  Patient states that overall she seems to be doing really well.  Patient states that her back seems to be doing decent.  Does not feel that she even needs manipulation at this point.     Past Medical History:  Diagnosis Date  . Graves Disease Sept 2010   s/p 131 ablation, managed by [facility] medical assoicates Dr. Bubba Camp  . Graves' orbitopathy 2010   managed by [facility]   Past Surgical History:  Procedure Laterality Date  . BREAST BIOPSY  2006   negative   Social History   Socioeconomic History  . Marital status: Single    Spouse name: None  . Number of children: None  . Years of education: None  . Highest education level: None  Social Needs  . Financial resource strain: None  . Food insecurity - worry: None  . Food insecurity - inability: None  . Transportation needs - medical: None  . Transportation needs - non-medical: None  Occupational History  . Occupation: full-time Programmer, applications: [employer]  Tobacco Use  . Smoking status: Never Smoker  . Smokeless tobacco: Never Used  Substance and Sexual Activity  . Alcohol use: Yes    Alcohol/week: 0.0 oz    Comment: moderate  . Drug use: No  . Sexual activity: Yes    Birth  control/protection: Other-see comments    Comment: VASTCOMY   Other Topics Concern  . 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 Methimazole    Family History  Problem Relation Age of Onset  . Heart disease Father        CABG, Ablation for Atrial Flutter, valve replacement, pacemaker, pericardial effusion s/p window  . Cancer Father        late 52's history of colon CA  . Cancer Maternal Grandmother        possibly benign  . Breast cancer Maternal Grandmother      Past medical history, social, surgical and family history all reviewed in electronic medical record.  No pertanent information unless stated regarding to the chief complaint.   Review of Systems:Review of systems updated and as accurate as of 09/17/17  No headache, visual changes, nausea, vomiting, diarrhea, constipation, dizziness, abdominal pain, skin rash, fevers, chills, night sweats, weight loss, swollen lymph nodes, body aches, joint swelling, muscle aches, chest pain, shortness of breath, mood changes.   Objective  Blood pressure 110/74, pulse (!) 57, height 5\' 8"  (1.727 m), weight 145 lb (65.8 kg), SpO2 98 %. Systems examined below as of 09/17/17   General: No  apparent distress alert and oriented x3 mood and affect normal, dressed appropriately.  HEENT: Pupils equal, extraocular movements intact  Respiratory: Patient's speak in full sentences and does not appear short of breath  Cardiovascular: No lower extremity edema, non tender, no erythema  Skin: Warm dry intact with no signs of infection or rash on extremities or on axial skeleton.  Abdomen: Soft nontender  Neuro: Cranial nerves II through XII are intact, neurovascularly intact in all extremities with 2+ DTRs and 2+ pulses.  Lymph: No lymphadenopathy of posterior or anterior cervical chain or axillae bilaterally.  Gait normal with good balance and coordination.    MSK:  Non tender with full range of motion and good stability and symmetric strength and tone of shoulders, elbows, wrist, hip and ankles bilaterally.    Left knee exam shows the patient does have full range of motion.  Varicose veins noted on the.  No masses appreciated.  No Baker's cyst.  Full range of motion.    Impression and Recommendations:     This case required medical decision making of moderate complexity.      Note: This dictation was prepared with Dragon dictation along with smaller phrase technology. Any transcriptional errors that result from this process are unintentional.

## 2017-09-17 ENCOUNTER — Ambulatory Visit: Payer: Federal, State, Local not specified - PPO | Admitting: Family Medicine

## 2017-09-17 ENCOUNTER — Encounter: Payer: Self-pay | Admitting: Family Medicine

## 2017-09-17 DIAGNOSIS — M2140 Flat foot [pes planus] (acquired), unspecified foot: Secondary | ICD-10-CM

## 2017-09-17 NOTE — Assessment & Plan Note (Signed)
Doing well with the orthotics.  Likely helping the back.  Continue the same regimen at this time.  Follow-up again as needed

## 2017-09-17 NOTE — Patient Instructions (Signed)
Good to see you  Rebecca Cole is your friend.  [email or web address] for the knee compression sleeve.  81mg  aspirin daily and 2 when traveling.  CoQ10 200mg  daily  Igli orthotics and see if they will send you some.  See me when you need me and I will write you about the protein .

## 2017-10-11 ENCOUNTER — Other Ambulatory Visit: Payer: Self-pay | Admitting: Internal Medicine

## 2017-10-15 ENCOUNTER — Telehealth: Payer: Self-pay

## 2017-10-15 DIAGNOSIS — E063 Autoimmune thyroiditis: Secondary | ICD-10-CM

## 2017-10-15 MED ORDER — LEVOTHYROXINE SODIUM 112 MCG PO TABS: ORAL_TABLET | ORAL | 0 refills | Status: DC

## 2017-10-15 NOTE — Telephone Encounter (Signed)
RX FOR 112 MCG ONCE WEEKLY CORRECTED AND RE SENT.

## 2017-10-15 NOTE — Addendum Note (Signed)
Addended by: [name] on: 10/15/2017 08:03 PM   Modules accepted: Orders

## 2017-10-15 NOTE — Telephone Encounter (Signed)
Please call pharmacy to clarify script instructions/dosage. There are two scripts for levothyroxine and the instructions are different on each.

## 2017-10-15 NOTE — Telephone Encounter (Signed)
Please advise 

## 2017-11-25 ENCOUNTER — Telehealth: Payer: Self-pay | Admitting: Internal Medicine

## 2017-11-25 DIAGNOSIS — E782 Mixed hyperlipidemia: Secondary | ICD-10-CM

## 2017-11-25 DIAGNOSIS — E559 Vitamin D deficiency, unspecified: Secondary | ICD-10-CM

## 2017-11-25 DIAGNOSIS — R5383 Other fatigue: Secondary | ICD-10-CM

## 2017-11-25 DIAGNOSIS — E063 Autoimmune thyroiditis: Secondary | ICD-10-CM

## 2017-11-25 NOTE — Telephone Encounter (Signed)
Please advise 

## 2017-11-25 NOTE — Telephone Encounter (Signed)
LMTCB. Need to schedule pt for a fasting lab appt. Labs have been ordered. PEC may speak with pt.

## 2017-11-25 NOTE — Telephone Encounter (Signed)
Yes thank!

## 2017-11-25 NOTE — Telephone Encounter (Signed)
Looks like pt hasn't had some labs done since 2017 so I ordered TSH, lipid, CMP, CBC, and vitamin D. Is there anything else that needs to be ordered?

## 2017-11-25 NOTE — Telephone Encounter (Signed)
Copied from [place] [id number]. Topic: Quick Communication - See Telephone Encounter >> Nov 25, 2017 11:21 AM Ahmed Prima L wrote: CRM for notification. See Telephone encounter for: 11/25/17.  Patient is requesting to have her thyroid checked. Please call patient [phone]

## 2017-11-28 ENCOUNTER — Telehealth: Payer: Self-pay | Admitting: *Deleted

## 2017-11-28 ENCOUNTER — Other Ambulatory Visit (INDEPENDENT_AMBULATORY_CARE_PROVIDER_SITE_OTHER): Payer: Federal, State, Local not specified - PPO

## 2017-11-28 DIAGNOSIS — E785 Hyperlipidemia, unspecified: Secondary | ICD-10-CM

## 2017-11-28 DIAGNOSIS — E782 Mixed hyperlipidemia: Secondary | ICD-10-CM

## 2017-11-28 DIAGNOSIS — E063 Autoimmune thyroiditis: Secondary | ICD-10-CM | POA: Diagnosis not present

## 2017-11-28 DIAGNOSIS — R5383 Other fatigue: Secondary | ICD-10-CM | POA: Diagnosis not present

## 2017-11-28 NOTE — Telephone Encounter (Signed)
Pt was added to the lab schedule an hour before coming in for labs. I only released the three test that had an expected date on 11/26/17 on them (CMP, TSH, & Free T4). However, I drew enough & an additional tube in case you want the remaining labs also. Pt has a CPE in June. She had only ate a granola bar in the last 6 hours (57min before she came in). Labs will go out with courier tomorrow morning around 9:30.

## 2017-11-28 NOTE — Telephone Encounter (Signed)
Can you run a lipid? Thanks for asking

## 2017-11-29 ENCOUNTER — Other Ambulatory Visit: Payer: Federal, State, Local not specified - PPO

## 2017-11-29 LAB — COMPREHENSIVE METABOLIC PANEL
ALBUMIN: 4.4 g/dL (ref 3.5–5.2)
ALK PHOS: 62 U/L (ref 39–117)
ALT: 13 U/L (ref 0–35)
AST: 17 U/L (ref 0–37)
BILIRUBIN TOTAL: 0.7 mg/dL (ref 0.2–1.2)
BUN: 11 mg/dL (ref 6–23)
CO2: 28 mEq/L (ref 19–32)
CREATININE: 0.82 mg/dL (ref 0.40–1.20)
Calcium: 9.3 mg/dL (ref 8.4–10.5)
Chloride: 104 mEq/L (ref 96–112)
GFR: 76.81 mL/min (ref >60.00)
GLUCOSE: 98 mg/dL (ref 70–99)
POTASSIUM: 3.8 meq/L (ref 3.5–5.1)
SODIUM: 140 meq/L (ref 135–145)
TOTAL PROTEIN: 6.8 g/dL (ref 6.0–8.3)

## 2017-11-29 LAB — LIPID PANEL
Cholesterol: 206 mg/dL — ABNORMAL HIGH (ref 0–200)
HDL: 70.5 mg/dL (ref >39.00)
LDL Cholesterol: 124 mg/dL — ABNORMAL HIGH (ref 0–99)
NONHDL: 135.41
Total CHOL/HDL Ratio: 3
Triglycerides: 55 mg/dL (ref 0.0–149.0)
VLDL: 11 mg/dL (ref 0.0–40.0)

## 2017-11-29 LAB — T4, FREE: Free T4: 1.28 ng/dL (ref 0.60–1.60)

## 2017-11-29 LAB — TSH: TSH: 0.34 u[IU]/mL — ABNORMAL LOW (ref 0.35–4.50)

## 2017-11-29 NOTE — Telephone Encounter (Signed)
Lipid added

## 2017-11-29 NOTE — Addendum Note (Signed)
Addended by: Leeanne Rio on: 11/29/2017 08:07 AM   Modules accepted: Orders

## 2017-12-03 NOTE — Telephone Encounter (Signed)
Pt had labs drawn on 11/28/2017

## 2017-12-05 DIAGNOSIS — L700 Acne vulgaris: Secondary | ICD-10-CM | POA: Diagnosis not present

## 2017-12-05 DIAGNOSIS — L814 Other melanin hyperpigmentation: Secondary | ICD-10-CM | POA: Diagnosis not present

## 2017-12-05 DIAGNOSIS — L821 Other seborrheic keratosis: Secondary | ICD-10-CM | POA: Diagnosis not present

## 2017-12-05 DIAGNOSIS — D2362 Other benign neoplasm of skin of left upper limb, including shoulder: Secondary | ICD-10-CM | POA: Diagnosis not present

## 2017-12-10 ENCOUNTER — Encounter: Payer: Self-pay | Admitting: Internal Medicine

## 2017-12-12 ENCOUNTER — Other Ambulatory Visit: Payer: Self-pay | Admitting: Internal Medicine

## 2017-12-12 DIAGNOSIS — E063 Autoimmune thyroiditis: Secondary | ICD-10-CM

## 2018-01-10 ENCOUNTER — Other Ambulatory Visit: Payer: Self-pay | Admitting: Internal Medicine

## 2018-01-10 NOTE — Telephone Encounter (Signed)
Refilled: 10/11/2017 Last OV: 01/25/2017 Next OV: 01/31/2018  Last TSH: 11/28/2017  Abnormal    Result note states not to treat at this time unless  she has symptoms.

## 2018-01-10 NOTE — Telephone Encounter (Signed)
Incorrect.  Refilled.

## 2018-01-13 ENCOUNTER — Other Ambulatory Visit (INDEPENDENT_AMBULATORY_CARE_PROVIDER_SITE_OTHER): Payer: Federal, State, Local not specified - PPO

## 2018-01-13 DIAGNOSIS — E063 Autoimmune thyroiditis: Secondary | ICD-10-CM

## 2018-01-13 LAB — T4, FREE: Free T4: 1.35 ng/dL (ref 0.60–1.60)

## 2018-01-13 LAB — TSH: TSH: 0.26 u[IU]/mL — ABNORMAL LOW (ref 0.35–4.50)

## 2018-01-16 ENCOUNTER — Encounter: Payer: Self-pay | Admitting: Internal Medicine

## 2018-01-20 ENCOUNTER — Other Ambulatory Visit: Payer: Self-pay | Admitting: Obstetrics & Gynecology

## 2018-01-20 DIAGNOSIS — Z1231 Encounter for screening mammogram for malignant neoplasm of breast: Secondary | ICD-10-CM

## 2018-01-22 ENCOUNTER — Encounter: Payer: Federal, State, Local not specified - PPO | Admitting: Obstetrics & Gynecology

## 2018-01-27 ENCOUNTER — Other Ambulatory Visit: Payer: Self-pay | Admitting: Internal Medicine

## 2018-01-27 DIAGNOSIS — E063 Autoimmune thyroiditis: Secondary | ICD-10-CM

## 2018-01-29 ENCOUNTER — Encounter: Payer: Federal, State, Local not specified - PPO | Admitting: Internal Medicine

## 2018-01-30 ENCOUNTER — Encounter: Payer: Self-pay | Admitting: Obstetrics & Gynecology

## 2018-01-30 ENCOUNTER — Ambulatory Visit (INDEPENDENT_AMBULATORY_CARE_PROVIDER_SITE_OTHER): Payer: Federal, State, Local not specified - PPO | Admitting: Obstetrics & Gynecology

## 2018-01-30 VITALS — BP 112/70 | Ht 67.75 in | Wt 136.0 lb

## 2018-01-30 DIAGNOSIS — Z308 Encounter for other contraceptive management: Secondary | ICD-10-CM

## 2018-01-30 DIAGNOSIS — Z01419 Encounter for gynecological examination (general) (routine) without abnormal findings: Secondary | ICD-10-CM

## 2018-01-30 NOTE — Progress Notes (Signed)
    Donae Kueker 1960-11-12 [id number]   History:    57 y.o. G0 Single  RP:  Established patient presenting for annual gyn exam   HPI: Menses regular normal every month.  No BTB.  No pelvic pain.  Abstinent x last Annual/Gyn visit.  Urine and bowel movements normal.  Breasts normal.  Health labs with family physician.  Past medical history,surgical history, family history and social history were all reviewed and documented in the EPIC chart.  Gynecologic History Patient's last menstrual period was 01/26/2018. Contraception: Abstinent Last Pap: 01/2017. Results were: Negative, HPV HR neg Last mammogram: 01/2017. Results were: Negative Bone Density: Never Colonoscopy: 06/2017  Obstetric History OB History  Gravida Para Term Preterm AB Living  0 0 0 0 0 0  SAB TAB Ectopic Multiple Live Births  0 0 0 0 0     ROS: A ROS was performed and pertinent positives and negatives are included in the history.  GENERAL: No fevers or chills. HEENT: No change in vision, no earache, sore throat or sinus congestion. NECK: No pain or stiffness. CARDIOVASCULAR: No chest pain or pressure. No palpitations. PULMONARY: No shortness of breath, cough or wheeze. GASTROINTESTINAL: No abdominal pain, nausea, vomiting or diarrhea, melena or bright red blood per rectum. GENITOURINARY: No urinary frequency, urgency, hesitancy or dysuria. MUSCULOSKELETAL: No joint or muscle pain, no back pain, no recent trauma. DERMATOLOGIC: No rash, no itching, no lesions. ENDOCRINE: No polyuria, polydipsia, no heat or cold intolerance. No recent change in weight. HEMATOLOGICAL: No anemia or easy bruising or bleeding. NEUROLOGIC: No headache, seizures, numbness, tingling or weakness. PSYCHIATRIC: No depression, no loss of interest in normal activity or change in sleep pattern.     Exam:   BP 112/70   Ht 5' 7.75" (1.721 m)   Wt 136 lb (61.7 kg)   LMP 01/26/2018   BMI 20.83 kg/m   Body mass index is 20.83 kg/m.  General  appearance : Well developed well nourished female. No acute distress HEENT: Eyes: no retinal hemorrhage or exudates,  Neck supple, trachea midline, no carotid bruits, no thyroidmegaly Lungs: Clear to auscultation, no rhonchi or wheezes, or rib retractions  Heart: Regular rate and rhythm, no murmurs or gallops Breast:Examined in sitting and supine position were symmetrical in appearance, no palpable masses or tenderness,  no skin retraction, no nipple inversion, no nipple discharge, no skin discoloration, no axillary or supraclavicular lymphadenopathy Abdomen: no palpable masses or tenderness, no rebound or guarding Extremities: no edema or skin discoloration or tenderness  Pelvic: Vulva: Normal             Vagina: No gross lesions or discharge  Cervix: No gross lesions or discharge.  Pap reflex done  Uterus  AV, normal size, shape and consistency, non-tender and mobile  Adnexa  Without masses or tenderness  Anus: Normal   Assessment/Plan:  57 y.o. female for annual exam   1. Encounter for routine gynecological examination with Papanicolaou smear of cervix Normal gynecologic exam.  Pap reflex done.  Breast exam normal.  Last colonoscopy 2018.  Health labs with family physician.  Good body mass index at 20.83.  Continue with regular physical activity. - Pap IG w/ reflex to HPV when ASC-U  2. Encounter for other contraceptive management Currently abstinent.  Will use condoms as needed.  Other orders - aspirin EC 81 MG tablet; Take 81 mg by mouth daily.  Princess Bruins MD, 3:34 PM 01/30/2018

## 2018-01-31 ENCOUNTER — Encounter: Payer: Self-pay | Admitting: Internal Medicine

## 2018-01-31 ENCOUNTER — Ambulatory Visit (INDEPENDENT_AMBULATORY_CARE_PROVIDER_SITE_OTHER): Payer: Federal, State, Local not specified - PPO | Admitting: Internal Medicine

## 2018-01-31 VITALS — BP 100/80 | HR 44 | Temp 98.0°F | Resp 13 | Ht 67.75 in | Wt 136.8 lb

## 2018-01-31 DIAGNOSIS — Z0000 Encounter for general adult medical examination without abnormal findings: Secondary | ICD-10-CM | POA: Diagnosis not present

## 2018-01-31 DIAGNOSIS — Z23 Encounter for immunization: Secondary | ICD-10-CM | POA: Diagnosis not present

## 2018-01-31 DIAGNOSIS — R7301 Impaired fasting glucose: Secondary | ICD-10-CM | POA: Diagnosis not present

## 2018-01-31 DIAGNOSIS — E559 Vitamin D deficiency, unspecified: Secondary | ICD-10-CM | POA: Diagnosis not present

## 2018-01-31 DIAGNOSIS — E063 Autoimmune thyroiditis: Secondary | ICD-10-CM

## 2018-01-31 DIAGNOSIS — Z01419 Encounter for gynecological examination (general) (routine) without abnormal findings: Secondary | ICD-10-CM | POA: Diagnosis not present

## 2018-01-31 MED ORDER — DOXYCYCLINE HYCLATE 100 MG PO TABS: 100.0000 mg | ORAL_TABLET | Freq: Two times a day (BID) | ORAL | 3 refills | Status: DC

## 2018-01-31 MED ORDER — ZOSTER VAC RECOMB ADJUVANTED 50 MCG/0.5ML IM SUSR: 0.5000 mL | Freq: Once | INTRAMUSCULAR | 1 refills | Status: AC

## 2018-01-31 NOTE — Progress Notes (Signed)
Patient ID: Nevin Kozuch, female    DOB: 1960/08/12  Age: 57 y.o. MRN: [id number]  The patient is here for annual non gyn preventive  examination and management of other chronic and acute problems.   The risk factors are reflected in the social history.  The roster of all physicians providing medical care to patient - is listed in the Snapshot section of the chart.  Activities of daily living:  The patient is 100% independent in all ADLs: dressing, toileting, feeding as well as independent mobility  Home safety : The patient has smoke detectors in the home. They wear seatbelts.  There are no firearms at home. There is no violence in the home.   There is no risks for hepatitis, STDs or HIV. There is no   history of blood transfusion. They have no travel history to infectious disease endemic areas of the world.  The patient has seen their dentist in the last six month. They have seen their eye doctor in the last year.    They do not  have excessive sun exposure. Discussed the need for sun protection: hats, long sleeves and use of sunscreen if there is significant sun exposure.   sh Diet: the importance of a healthy diet is discussed. She has a vegetarian diet but does eat eggs.  The benefits of regular aerobic exercise were discussed. She exercises 5 times per week ,  60 minutes.   Depression screen: there are no signs or vegative symptoms of depression- irritability, change in appetite, anhedonia, sadness/tearfullness.  The following portions of the patient's history were reviewed and updated as appropriate: allergies, current medications, past family history, past medical history,  past surgical history, past social history  and problem list.  Visual acuity was not assessed per patient preference since she has regular follow up with her ophthalmologist. Hearing and body mass index were assessed and reviewed.   During the course of the visit the patient was educated and counseled about  appropriate screening and preventive services including : fall prevention , diabetes screening, nutrition counseling, colorectal cancer screening, and recommended immunizations.    CC: The primary encounter diagnosis was Hypothyroidism, acquired, autoimmune. Diagnoses of Vitamin D deficiency, Impaired fasting glucose, Need for diphtheria-tetanus-pertussis (Tdap) vaccine, and Routine general medical examination at a health care facility were also pertinent to this visit.  1) Hypothyroid:  Her weekly dose of thyroid supplementation  was  reduced slightly for TSH 0.26 2 weeks ago from 724 mcg weekly to 712 weekly    2) she was advised to start taking a daily  baby aspirin bc of varicose veins noted on a recent LE ultrasound done by Dr. Tamala Julian.  Since then she has been bleeding and bruising more easily as she lives a very active lifestyle   3) Inability to build muscle: discussed her relative lack of protein due to vegetarian diet.  deficiency   4) VAacination status Had MMR November 02 2006 prior to travel to [place] , along with Hep  A and B,  ,  Typhoid vaccine Feb 2008.  Poli vaccine in Oct 2000 ,    History Tabatha has a past medical history of Graves Disease (Sept 2010) and Graves' orbitopathy (2010).   She has a past surgical history that includes Breast biopsy (2006).   Her family history includes Breast cancer in her maternal grandmother; Cancer in her father and maternal grandmother; Heart disease in her father.She reports that she has never smoked. She has never used smokeless tobacco.  She reports that she drinks alcohol. She reports that she does not use drugs.  Outpatient Medications Prior to Visit  Medication Sig Dispense Refill  . aspirin EC 81 MG tablet Take 81 mg by mouth daily.    . Cholecalciferol (VITAMIN D3) 1000 UNITS CAPS Take 1 capsule by mouth daily.      . fluticasone (FLONASE) 50 MCG/ACT nasal spray Place 2 sprays into both nostrils daily. 16 g 2  . ketoconazole (NIZORAL) 2 %  cream Apply 1 application topically daily. 15 g 2  . ketoconazole (NIZORAL) 2 % cream APPLY 1 APPLICATION TOPICALLY DAILY. 15 g 0  . levothyroxine (SYNTHROID, LEVOTHROID) 100 MCG tablet TAKE 1 TABLET (100 MCG TOTAL) BY MOUTH 6 DAYS PER WEEK (OTHER DAY WILL BE 112 MG) 90 tablet 0  . levothyroxine (SYNTHROID, LEVOTHROID) 112 MCG tablet TAKE 1 TABLET BY MOUTH ONCE WEEKLY (DOSE IS  100 MCG ALL OTHER DAYS) 30 tablet 0  . doxycycline (VIBRA-TABS) 100 MG tablet Take 1 tablet (100 mg total) by mouth 2 (two) times daily. 14 tablet 3   No facility-administered medications prior to visit.     Review of Systems   Patient denies headache, fevers, malaise, unintentional weight loss, skin rash, eye pain, sinus congestion and sinus pain, sore throat, dysphagia,  hemoptysis , cough, dyspnea, wheezing, chest pain, palpitations, orthopnea, edema, abdominal pain, nausea, melena, diarrhea, constipation, flank pain, dysuria, hematuria, urinary  Frequency, nocturia, numbness, tingling, seizures,  Focal weakness, Loss of consciousness,  Tremor, insomnia, depression, anxiety, and suicidal ideation.     Objective:  BP 100/80 (BP Location: Left Arm, Patient Position: Sitting, Cuff Size: Normal)   Pulse (!) 44   Temp 98 F (36.7 C) (Oral)   Resp 13   Ht 5' 7.75" (1.721 m)   Wt 136 lb 12.8 oz (62.1 kg)   LMP 01/26/2018   SpO2 97%   BMI 20.95 kg/m   Physical Exam   General appearance: alert, cooperative and appears stated age Ears: normal TM's and external ear canals both ears Throat: lips, mucosa, and tongue normal; teeth and gums normal Neck: no adenopathy, no carotid bruit, supple, symmetrical, trachea midline and thyroid not enlarged, symmetric, no tenderness/mass/nodules Back: symmetric, no curvature. ROM normal. No CVA tenderness. Lungs: clear to auscultation bilaterally Heart: regular rate and rhythm, S1, S2 normal, no murmur, click, rub or gallop Abdomen: soft, non-tender; bowel sounds normal; no  masses,  no organomegaly Pulses: 2+ and symmetric Skin: Skin color, texture, turgor normal. No rashes or lesions Lymph nodes: Cervical, supraclavicular, and axillary nodes normal.    Assessment & Plan:   Problem List Items Addressed This Visit    Vitamin D deficiency   Relevant Orders   VITAMIN D 25 Hydroxy (Vit-D Deficiency, Fractures)   Hypothyroidism, acquired, autoimmune - Primary    Weekly dose reduced for TSH of 0.28 2  Weeks ago. .  Repeat TSH /T4 in 4 to  6 weeks       Relevant Orders   TSH   T4, free   Routine general medical examination at a health care facility    Annual comprehensive preventive exam was done as well as an evaluation and management of chronic conditions .  During the course of the visit the patient was educated and counseled about appropriate screening and preventive services including :  diabetes screening, lipid analysis with projected  10 year  risk for CAD , nutrition counseling, breast, cervical and colorectal cancer screening, and recommended immunizations.  Printed recommendations  for health maintenance screenings was given.  Shingrx vaccine recommended.       Impaired fasting glucose    Fasting sugars have been > 100 but < 110.   A1c ordered to assess risk for diabetes      Relevant Orders   Hemoglobin A1c    Other Visit Diagnoses    Need for diphtheria-tetanus-pertussis (Tdap) vaccine          I am having Regan Lemming start on Zoster Vaccine Adjuvanted. I am also having her maintain her Vitamin D3, fluticasone, ketoconazole, ketoconazole, levothyroxine, levothyroxine, aspirin EC, and doxycycline.  Meds ordered this encounter  Medications  . Zoster Vaccine Adjuvanted [facility]) injection    Sig: Inject 0.5 mLs into the muscle once for 1 dose.    Dispense:  1 each    Refill:  1  . doxycycline (VIBRA-TABS) 100 MG tablet    Sig: Take 1 tablet (100 mg total) by mouth 2 (two) times daily.    Dispense:  14 tablet    Refill:  3    Replace  previous one,   with refills    Medications Discontinued During This Encounter  Medication Reason  . doxycycline (VIBRA-TABS) 100 MG tablet Reorder    Follow-up: No follow-ups on file.   [name], MD

## 2018-01-31 NOTE — Patient Instructions (Signed)
Good to see you!!  To recap:  Agree with a daily baby aspirin when travelling,  Otherwise daily aspirin has risks that outweigh the benefits with regard to stroke and MI prevention   Agree with. CoQ10  When you return for labs in 4-6 weeks we will also check your a1c and vitamin d  Increase your daily protein intake  Especially after lifting weights.   You might want to try thepremixed protein drink called Premier Protein shake for breakfast or late night snack . It is great tasting,   very low sugar and available everywhere  The ShingRx vaccine will be available in about 6 months and IS ADVISED for all interested adults over 50 to prevent shingles

## 2018-02-01 DIAGNOSIS — R7301 Impaired fasting glucose: Secondary | ICD-10-CM | POA: Insufficient documentation

## 2018-02-01 NOTE — Assessment & Plan Note (Signed)
Weekly dose reduced for TSH of 0.28 2  Weeks ago. .  Repeat TSH /T4 in 4 to  6 weeks

## 2018-02-01 NOTE — Assessment & Plan Note (Addendum)
Annual comprehensive preventive exam was done as well as an evaluation and management of chronic conditions .  During the course of the visit the patient was educated and counseled about appropriate screening and preventive services including :  diabetes screening, lipid analysis with projected  10 year  risk for CAD , nutrition counseling, breast, cervical and colorectal cancer screening, and recommended immunizations.  Printed recommendations for health maintenance screenings was given.  Shingrx vaccine recommended.

## 2018-02-01 NOTE — Assessment & Plan Note (Signed)
Fasting sugars have been > 100 but < 110.   A1c ordered to assess risk for diabetes

## 2018-02-02 ENCOUNTER — Encounter: Payer: Self-pay | Admitting: Obstetrics & Gynecology

## 2018-02-02 NOTE — Patient Instructions (Signed)
1. Encounter for routine gynecological examination with Papanicolaou smear of cervix Normal gynecologic exam.  Pap reflex done.  Breast exam normal.  Last colonoscopy 2018.  Health labs with family physician.  Good body mass index at 20.83.  Continue with regular physical activity. - Pap IG w/ reflex to HPV when ASC-U  2. Encounter for other contraceptive management Currently abstinent.  Will use condoms as needed.  Other orders - aspirin EC 81 MG tablet; Take 81 mg by mouth daily.  Rebecca Cole, it was a pleasure seeing you today!  I will inform you of your results as soon as they are available.

## 2018-02-03 LAB — PAP IG W/ RFLX HPV ASCU

## 2018-02-21 ENCOUNTER — Ambulatory Visit
Admission: RE | Admit: 2018-02-21 | Discharge: 2018-02-21 | Disposition: A | Discharge status: Home / Self Care | Payer: Federal, State, Local not specified - PPO | Source: Ambulatory Visit | Attending: Obstetrics & Gynecology | Admitting: Obstetrics & Gynecology

## 2018-02-21 DIAGNOSIS — Z1231 Encounter for screening mammogram for malignant neoplasm of breast: Secondary | ICD-10-CM | POA: Diagnosis not present

## 2018-02-25 DIAGNOSIS — K080 Exfoliation of teeth due to systemic causes: Secondary | ICD-10-CM | POA: Diagnosis not present

## 2018-03-06 ENCOUNTER — Other Ambulatory Visit (INDEPENDENT_AMBULATORY_CARE_PROVIDER_SITE_OTHER): Payer: Federal, State, Local not specified - PPO

## 2018-03-06 DIAGNOSIS — R7301 Impaired fasting glucose: Secondary | ICD-10-CM | POA: Diagnosis not present

## 2018-03-06 DIAGNOSIS — E559 Vitamin D deficiency, unspecified: Secondary | ICD-10-CM

## 2018-03-06 DIAGNOSIS — R5383 Other fatigue: Secondary | ICD-10-CM | POA: Diagnosis not present

## 2018-03-06 DIAGNOSIS — E063 Autoimmune thyroiditis: Secondary | ICD-10-CM | POA: Diagnosis not present

## 2018-03-06 LAB — T4, FREE: Free T4: 1.34 ng/dL (ref 0.60–1.60)

## 2018-03-06 LAB — CBC WITH DIFFERENTIAL/PLATELET
Basophils Absolute: 0 10*3/uL (ref 0.0–0.1)
Basophils Relative: 0.9 % (ref 0.0–3.0)
Eosinophils Absolute: 0.1 10*3/uL (ref 0.0–0.7)
Eosinophils Relative: 2.7 % (ref 0.0–5.0)
HEMATOCRIT: 37.2 % (ref 36.0–46.0)
HEMOGLOBIN: 12.6 g/dL (ref 12.0–15.0)
LYMPHS PCT: 23.1 % (ref 12.0–46.0)
Lymphs Abs: 0.8 10*3/uL (ref 0.7–4.0)
MCHC: 33.9 g/dL (ref 30.0–36.0)
MCV: 92.2 fl (ref 78.0–100.0)
MONOS PCT: 14.2 % — AB (ref 3.0–12.0)
Monocytes Absolute: 0.5 10*3/uL (ref 0.1–1.0)
NEUTROS ABS: 2.1 10*3/uL (ref 1.4–7.7)
Neutrophils Relative %: 59.1 % (ref 43.0–77.0)
PLATELETS: 282 10*3/uL (ref 150.0–400.0)
RBC: 4.03 Mil/uL (ref 3.87–5.11)
RDW: 14.1 % (ref 11.5–15.5)
WBC: 3.6 10*3/uL — AB (ref 4.0–10.5)

## 2018-03-06 LAB — TSH: TSH: 0.57 u[IU]/mL (ref 0.35–4.50)

## 2018-03-06 LAB — VITAMIN D 25 HYDROXY (VIT D DEFICIENCY, FRACTURES): VITD: 41.01 ng/mL (ref 30.00–100.00)

## 2018-03-06 LAB — HEMOGLOBIN A1C: Hgb A1c MFr Bld: 5.8 % (ref 4.6–6.5)

## 2018-05-18 ENCOUNTER — Other Ambulatory Visit: Payer: Self-pay | Admitting: Internal Medicine

## 2018-07-18 DIAGNOSIS — H524 Presbyopia: Secondary | ICD-10-CM | POA: Diagnosis not present

## 2018-07-18 DIAGNOSIS — H5213 Myopia, bilateral: Secondary | ICD-10-CM | POA: Diagnosis not present

## 2018-07-18 DIAGNOSIS — H52203 Unspecified astigmatism, bilateral: Secondary | ICD-10-CM | POA: Diagnosis not present

## 2018-07-21 ENCOUNTER — Telehealth: Payer: Self-pay

## 2018-07-21 DIAGNOSIS — E063 Autoimmune thyroiditis: Secondary | ICD-10-CM

## 2018-07-21 NOTE — Addendum Note (Signed)
Addended by: [name] on: 07/21/2018 05:21 PM   Modules accepted: Orders

## 2018-07-21 NOTE — Telephone Encounter (Signed)
Pt had TSH checked in August 2019.

## 2018-07-21 NOTE — Telephone Encounter (Signed)
Copied from [place] [id number]. Topic: Appointment Scheduling - Scheduling Inquiry for Clinic >> Jul 21, 2018  8:59 AM Nils Flack wrote: Reason for CRM: pt would like to have orders for tsh and have someone call her to schedule.   Please call [phone]

## 2018-07-21 NOTE — Telephone Encounter (Signed)
Labs ordered.  Please call her to schedule lab appt

## 2018-07-22 ENCOUNTER — Ambulatory Visit (INDEPENDENT_AMBULATORY_CARE_PROVIDER_SITE_OTHER): Payer: Federal, State, Local not specified - PPO

## 2018-07-22 ENCOUNTER — Other Ambulatory Visit (INDEPENDENT_AMBULATORY_CARE_PROVIDER_SITE_OTHER): Payer: Federal, State, Local not specified - PPO

## 2018-07-22 DIAGNOSIS — E063 Autoimmune thyroiditis: Secondary | ICD-10-CM

## 2018-07-22 DIAGNOSIS — Z23 Encounter for immunization: Secondary | ICD-10-CM | POA: Diagnosis not present

## 2018-07-22 LAB — TSH: TSH: 0.52 u[IU]/mL (ref 0.35–4.50)

## 2018-07-22 LAB — T4, FREE: Free T4: 1.1 ng/dL (ref 0.60–1.60)

## 2018-07-22 NOTE — Telephone Encounter (Signed)
Spoke with pt and scheduled her a non fasting lab appt. Pt is aware of appt date and time.

## 2018-07-22 NOTE — Progress Notes (Signed)
Pt was seen today for NV for flu shot given IM in the LD. PT tolerated well.

## 2018-09-02 DIAGNOSIS — K080 Exfoliation of teeth due to systemic causes: Secondary | ICD-10-CM | POA: Diagnosis not present

## 2018-09-08 DIAGNOSIS — F4323 Adjustment disorder with mixed anxiety and depressed mood: Secondary | ICD-10-CM | POA: Diagnosis not present

## 2018-09-19 DIAGNOSIS — F4323 Adjustment disorder with mixed anxiety and depressed mood: Secondary | ICD-10-CM | POA: Diagnosis not present

## 2018-09-22 DIAGNOSIS — F4323 Adjustment disorder with mixed anxiety and depressed mood: Secondary | ICD-10-CM | POA: Diagnosis not present

## 2018-09-30 DIAGNOSIS — F4323 Adjustment disorder with mixed anxiety and depressed mood: Secondary | ICD-10-CM | POA: Diagnosis not present

## 2018-10-06 DIAGNOSIS — F4323 Adjustment disorder with mixed anxiety and depressed mood: Secondary | ICD-10-CM | POA: Diagnosis not present

## 2018-10-21 DIAGNOSIS — F4323 Adjustment disorder with mixed anxiety and depressed mood: Secondary | ICD-10-CM | POA: Diagnosis not present

## 2018-10-28 DIAGNOSIS — F4323 Adjustment disorder with mixed anxiety and depressed mood: Secondary | ICD-10-CM | POA: Diagnosis not present

## 2018-11-01 ENCOUNTER — Other Ambulatory Visit: Payer: Self-pay | Admitting: Internal Medicine

## 2018-11-01 MED ORDER — LEVOTHYROXINE SODIUM 100 MCG PO TABS: 100.0000 ug | ORAL_TABLET | Freq: Every day | ORAL | 1 refills | Status: DC

## 2018-11-04 DIAGNOSIS — F4323 Adjustment disorder with mixed anxiety and depressed mood: Secondary | ICD-10-CM | POA: Diagnosis not present

## 2018-11-13 DIAGNOSIS — F4323 Adjustment disorder with mixed anxiety and depressed mood: Secondary | ICD-10-CM | POA: Diagnosis not present

## 2018-11-27 DIAGNOSIS — F4323 Adjustment disorder with mixed anxiety and depressed mood: Secondary | ICD-10-CM | POA: Diagnosis not present

## 2018-12-11 DIAGNOSIS — F4323 Adjustment disorder with mixed anxiety and depressed mood: Secondary | ICD-10-CM | POA: Diagnosis not present

## 2019-01-15 DIAGNOSIS — F4323 Adjustment disorder with mixed anxiety and depressed mood: Secondary | ICD-10-CM | POA: Diagnosis not present

## 2019-02-02 ENCOUNTER — Encounter: Payer: Federal, State, Local not specified - PPO | Admitting: Obstetrics & Gynecology

## 2019-02-03 ENCOUNTER — Encounter: Payer: Federal, State, Local not specified - PPO | Admitting: Internal Medicine

## 2019-02-13 ENCOUNTER — Encounter: Payer: Federal, State, Local not specified - PPO | Admitting: Obstetrics & Gynecology

## 2019-03-31 ENCOUNTER — Encounter: Payer: Self-pay | Admitting: Emergency Medicine

## 2019-03-31 ENCOUNTER — Emergency Department
Admission: EM | Admit: 2019-03-31 | Discharge: 2019-03-31 | Disposition: A | Discharge status: Home / Self Care | Payer: Federal, State, Local not specified - PPO | Attending: Student | Admitting: Student

## 2019-03-31 ENCOUNTER — Other Ambulatory Visit: Payer: Self-pay

## 2019-03-31 DIAGNOSIS — E063 Autoimmune thyroiditis: Secondary | ICD-10-CM | POA: Diagnosis not present

## 2019-03-31 DIAGNOSIS — Z23 Encounter for immunization: Secondary | ICD-10-CM | POA: Diagnosis not present

## 2019-03-31 DIAGNOSIS — Z203 Contact with and (suspected) exposure to rabies: Secondary | ICD-10-CM | POA: Insufficient documentation

## 2019-03-31 DIAGNOSIS — Z2914 Encounter for prophylactic rabies immune globin: Secondary | ICD-10-CM | POA: Diagnosis not present

## 2019-03-31 DIAGNOSIS — Z7982 Long term (current) use of aspirin: Secondary | ICD-10-CM | POA: Diagnosis not present

## 2019-03-31 MED ORDER — RABIES VACCINE, PCEC IM SUSR
1.0000 mL | Freq: Once | INTRAMUSCULAR | Status: AC
  Administered 2019-03-31: 1 mL via INTRAMUSCULAR
  Filled 2019-03-31: qty 1

## 2019-03-31 MED ORDER — RABIES IMMUNE GLOBULIN 150 UNIT/ML IM INJ
20.0000 [IU]/kg | INJECTION | Freq: Once | INTRAMUSCULAR | Status: AC
  Administered 2019-03-31: 1125 [IU] via INTRAMUSCULAR
  Filled 2019-03-31: qty 8

## 2019-03-31 NOTE — ED Triage Notes (Signed)
Here for rabies series. Pt cleaned up bat yesterday and it was sent for testing and the state called pt today and told her bat tested positive and to come get rabies vaccine since she touched it and cleaned up after it.

## 2019-03-31 NOTE — ED Notes (Signed)
See triage note  Presents for rabies series  States she was exposed to a bat  States she cleaned up a bat yesterday

## 2019-03-31 NOTE — ED Provider Notes (Signed)
[facility] Emergency Department Provider Note   ____________________________________________   First MD Initiated Contact with Patient 03/31/19 1712     (approximate)  I have reviewed the triage vital signs and the nursing notes.   HISTORY  Chief Complaint Rabies Injection    HPI Rebecca Cole is a 58 y.o. female patient presents for rabies series.  Patient was cleaning a dead bat yesterday and was told today that the bat tested positive for rabies.  Patient denies bites or scratches.         Past Medical History:  Diagnosis Date  . Graves Disease Sept 2010   s/p 131 ablation, managed by [facility] medical assoicates Dr. Bubba Camp  . Graves' orbitopathy 2010   managed by [place]    Patient Active Problem List   Diagnosis Date Noted  . Impaired fasting glucose 02/01/2018  . Nonallopathic lesion of cervical region 08/20/2017  . Nonallopathic lesion of thoracic region 08/20/2017  . Nonallopathic lesion of lumbosacral region 08/20/2017  . Pes planus 08/20/2017  . Frozen shoulder syndrome 02/19/2017  . Cervical radiculopathy at C5 11/07/2016  . Eustachian tube disorder, left 05/08/2016  . Occipital lymphadenopathy 05/08/2016  . Vitamin D deficiency 01/04/2014  . Routine general medical examination at a health care facility 01/08/2012  . Screening for colon cancer 01/08/2012  . Graves' orbitopathy   . Hypothyroidism, acquired, autoimmune 04/06/2009    Past Surgical History:  Procedure Laterality Date  . BREAST BIOPSY  2006   negative  . BREAST CYST EXCISION Right 2006    Prior to Admission medications   Medication Sig Start Date End Date Taking? Authorizing Provider  aspirin EC 81 MG tablet Take 81 mg by mouth daily.    [provider]  Cholecalciferol (VITAMIN D3) 1000 UNITS CAPS Take 1 capsule by mouth daily.      [provider]  doxycycline (VIBRA-TABS) 100 MG tablet Take 1 tablet (100 mg total) by mouth 2  (two) times daily. 01/31/18   [name], MD  fluticasone (FLONASE) 50 MCG/ACT nasal spray Place 2 sprays into both nostrils daily. 05/08/16   Burnard Hawthorne, FNP  ketoconazole (NIZORAL) 2 % cream Apply 1 application topically daily. 01/25/17   [name], MD  ketoconazole (NIZORAL) 2 % cream APPLY 1 APPLICATION TOPICALLY DAILY. 05/07/17   [name], MD  levothyroxine (SYNTHROID, LEVOTHROID) 100 MCG tablet Take 1 tablet (100 mcg total) by mouth daily before breakfast. 11/01/18   [name], MD  levothyroxine (SYNTHROID, LEVOTHROID) 112 MCG tablet TAKE 1 TABLET BY MOUTH ONCE WEEKLY (DOSE IS  100 MCG ALL OTHER DAYS) 10/15/17   [name], MD    Allergies Methimazole  Family History  Problem Relation Age of Onset  . Heart disease Father        CABG, Ablation for Atrial Flutter, valve replacement, pacemaker, pericardial effusion s/p window  . Cancer Father        late 68's history of colon CA  . Cancer Maternal Grandmother        possibly benign  . Breast cancer Maternal Grandmother     Social History Social History   Tobacco Use  . Smoking status: Never Smoker  . Smokeless tobacco: Never Used  Substance Use Topics  . Alcohol use: Yes    Alcohol/week: 0.0 standard drinks    Comment: moderate  . Drug use: No    Review of Systems Constitutional: No fever/chills Eyes: No visual changes. ENT:  No sore throat. Cardiovascular: Denies chest pain. Respiratory: Denies shortness of breath. Gastrointestinal: No abdominal pain.  No nausea, no vomiting.  No diarrhea.  No constipation. Genitourinary: Negative for dysuria. Musculoskeletal: Negative for back pain. Skin: Negative for rash. Neurological: Negative for headaches, focal weakness or numbness. Endocrine:  Graves' disease Hematological/Lymphatic:  Allergic/Immunilogical: Methimazole ____________________________________________   PHYSICAL EXAM:  VITAL SIGNS: ED Triage Vitals  Enc Vitals Group     BP  03/31/19 1711 (!) 148/102     Pulse Rate 03/31/19 1711 79     Resp 03/31/19 1711 16     Temp 03/31/19 1711 98.5 F (36.9 C)     Temp Source 03/31/19 1711 Oral     SpO2 03/31/19 1711 98 %     Weight 03/31/19 1712 126 lb (57.2 kg)     Height 03/31/19 1712 5\' 8"  (1.727 m)     Head Circumference --      Peak Flow --      Pain Score 03/31/19 1712 0     Pain Loc --      Pain Edu? --      Excl. in [place]? --    Constitutional: Alert and oriented. Well appearing and in no acute distress. Cardiovascular: Normal rate, regular rhythm. Grossly normal heart sounds.  Good peripheral circulation. Respiratory: Normal respiratory effort.  No retractions. Lungs CTAB. Skin:  Skin is warm, dry and intact. No rash noted. Psychiatric: Mood and affect are normal. Speech and behavior are normal.  ____________________________________________   LABS (all labs ordered are listed, but only abnormal results are displayed)  Labs Reviewed - No data to display ____________________________________________  EKG   ____________________________________________  RADIOLOGY  ED MD interpretation:    Official radiology report(s): No results found.  ____________________________________________   PROCEDURES  Procedure(s) performed (including Critical Care):  Procedures   ____________________________________________   INITIAL IMPRESSION / ASSESSMENT AND PLAN / ED COURSE  As part of my medical decision making, I reviewed the following data within the electronic MEDICAL RECORD NUMBER         Rebecca Cole was evaluated in Emergency Department on 03/31/2019 for the symptoms described in the history of present illness. She was evaluated in the context of the global COVID-19 pandemic, which necessitated consideration that the patient might be at risk for infection with the SARS-CoV-2 virus that causes COVID-19. Institutional protocols and algorithms that pertain to the evaluation of patients at risk for COVID-19 are  in a state of rapid change based on information released by regulatory bodies including the CDC and federal and state organizations. These policies and algorithms were followed during the patient's care in the ED.  Patient presents for rabies series secondary to positive exposure from a bat.  Patient given discharge care instruction advised to return back on day 3, 7, and 14 to complete series.      ____________________________________________   FINAL CLINICAL IMPRESSION(S) / ED DIAGNOSES  Final diagnoses:  Contact with and exposure to rabies     ED Discharge Orders    None       Note:  This document was prepared using Dragon voice recognition software and may include unintentional dictation errors.    Sable Feil, PA-C 03/31/19 1754    Lilia Pro., MD 04/01/19 [phone]

## 2019-04-03 ENCOUNTER — Other Ambulatory Visit: Payer: Self-pay

## 2019-04-03 ENCOUNTER — Ambulatory Visit
Admission: EM | Admit: 2019-04-03 | Discharge: 2019-04-03 | Disposition: A | Discharge status: Home / Self Care | Payer: Federal, State, Local not specified - PPO | Attending: Family Medicine | Admitting: Family Medicine

## 2019-04-03 DIAGNOSIS — Z203 Contact with and (suspected) exposure to rabies: Secondary | ICD-10-CM

## 2019-04-03 DIAGNOSIS — Z23 Encounter for immunization: Secondary | ICD-10-CM

## 2019-04-03 MED ORDER — RABIES VACCINE, PCEC IM SUSR
1.0000 mL | Freq: Once | INTRAMUSCULAR | Status: AC
  Administered 2019-04-03: 1 mL via INTRAMUSCULAR

## 2019-04-03 NOTE — ED Triage Notes (Signed)
Patient states that she is here for her Day 3 Rabies injection. Patient states that she tolerated the previous injections well.

## 2019-04-07 ENCOUNTER — Ambulatory Visit
Admission: EM | Admit: 2019-04-07 | Discharge: 2019-04-07 | Disposition: A | Discharge status: Home / Self Care | Payer: Federal, State, Local not specified - PPO | Attending: Family Medicine | Admitting: Family Medicine

## 2019-04-07 ENCOUNTER — Other Ambulatory Visit: Payer: Self-pay

## 2019-04-07 DIAGNOSIS — Z23 Encounter for immunization: Secondary | ICD-10-CM

## 2019-04-07 MED ORDER — RABIES VACCINE, PCEC IM SUSR
1.0000 mL | Freq: Once | INTRAMUSCULAR | Status: AC
  Administered 2019-04-07: 1 mL via INTRAMUSCULAR

## 2019-04-07 NOTE — ED Notes (Signed)
Patient refused to wait the 20 minutes and left.

## 2019-04-07 NOTE — ED Triage Notes (Signed)
Patient here for Day 7 Rabies injection.

## 2019-04-14 ENCOUNTER — Ambulatory Visit
Admission: EM | Admit: 2019-04-14 | Discharge: 2019-04-14 | Disposition: A | Discharge status: Home / Self Care | Payer: Federal, State, Local not specified - PPO | Attending: Urgent Care | Admitting: Urgent Care

## 2019-04-14 ENCOUNTER — Other Ambulatory Visit: Payer: Self-pay

## 2019-04-14 DIAGNOSIS — Z203 Contact with and (suspected) exposure to rabies: Secondary | ICD-10-CM

## 2019-04-14 DIAGNOSIS — Z23 Encounter for immunization: Secondary | ICD-10-CM | POA: Diagnosis not present

## 2019-04-14 MED ORDER — RABIES VACCINE, PCEC IM SUSR
1.0000 mL | Freq: Once | INTRAMUSCULAR | Status: AC
  Administered 2019-04-14: 1 mL via INTRAMUSCULAR

## 2019-04-14 NOTE — ED Triage Notes (Signed)
Patient is here for Day 14 Rabies Vaccine. Patient has tolerated previous injections well.

## 2019-04-29 ENCOUNTER — Other Ambulatory Visit: Payer: Self-pay | Admitting: Internal Medicine

## 2019-05-21 ENCOUNTER — Encounter: Payer: Federal, State, Local not specified - PPO | Admitting: Obstetrics & Gynecology

## 2019-05-27 ENCOUNTER — Encounter: Payer: Federal, State, Local not specified - PPO | Admitting: Internal Medicine

## 2019-10-24 ENCOUNTER — Other Ambulatory Visit: Payer: Self-pay | Admitting: Internal Medicine

## 2019-10-29 DIAGNOSIS — Z23 Encounter for immunization: Secondary | ICD-10-CM | POA: Diagnosis not present

## 2019-10-30 ENCOUNTER — Other Ambulatory Visit: Payer: Self-pay

## 2019-11-02 ENCOUNTER — Encounter: Payer: Self-pay | Admitting: Obstetrics & Gynecology

## 2019-11-02 ENCOUNTER — Other Ambulatory Visit: Payer: Self-pay

## 2019-11-02 ENCOUNTER — Ambulatory Visit (INDEPENDENT_AMBULATORY_CARE_PROVIDER_SITE_OTHER): Payer: Federal, State, Local not specified - PPO | Admitting: Obstetrics & Gynecology

## 2019-11-02 VITALS — BP 122/70 | Ht 68.0 in | Wt 133.0 lb

## 2019-11-02 DIAGNOSIS — Z113 Encounter for screening for infections with a predominantly sexual mode of transmission: Secondary | ICD-10-CM

## 2019-11-02 DIAGNOSIS — N951 Menopausal and female climacteric states: Secondary | ICD-10-CM | POA: Diagnosis not present

## 2019-11-02 DIAGNOSIS — Z01419 Encounter for gynecological examination (general) (routine) without abnormal findings: Secondary | ICD-10-CM

## 2019-11-02 DIAGNOSIS — Z1151 Encounter for screening for human papillomavirus (HPV): Secondary | ICD-10-CM

## 2019-11-02 DIAGNOSIS — E063 Autoimmune thyroiditis: Secondary | ICD-10-CM | POA: Diagnosis not present

## 2019-11-02 DIAGNOSIS — E559 Vitamin D deficiency, unspecified: Secondary | ICD-10-CM | POA: Diagnosis not present

## 2019-11-02 DIAGNOSIS — E039 Hypothyroidism, unspecified: Secondary | ICD-10-CM | POA: Diagnosis not present

## 2019-11-02 DIAGNOSIS — E7800 Pure hypercholesterolemia, unspecified: Secondary | ICD-10-CM | POA: Diagnosis not present

## 2019-11-02 NOTE — Patient Instructions (Signed)
1. Encounter for routine gynecological examination with Papanicolaou smear of cervix Normal gynecologic exam.  Pap test with high-risk HPV done today.  Breast exam normal.  Last screening mammogram July 2019 was negative, will schedule a screening mammogram now.  Colonoscopy 2013.  Fasting health labs here today.  Good body mass index at 20.22.  Continue with fitness and healthy nutrition. - CBC - Comp Met (CMET) - Lipid panel - TSH - T4, free - VITAMIN D 25 Hydroxy (Vit-D Deficiency, Fractures)  2. Perimenopause Menstrual periods are light every 1 to 3 months.  Will observe progression into menopause.  3. Screen for STD (sexually transmitted disease) Will use condoms as needed. - onorrhea and chlamydia on Pap - HIV antibody (with reflex) - RPR - Hepatitis C Antibody - Hepatitis B Surface AntiGEN  4. Hypothyroidism, acquired, autoimmune Controlled on levothyroxine.  We will do a TSH and free T4 today.  Rebecca Cole, it was a pleasure seeing you today!  I will inform you of your results as soon as they are available.

## 2019-11-02 NOTE — Progress Notes (Signed)
Rebecca Cole 12/27/1960 [id number]   History:    59 y.o. G0 Single.  Retired.  RP:  Established patient presenting for annual gyn exam   HPI: Menses regular normal every 1-3 months.  No BTB.  No pelvic pain.  Currently abstinent, broke up with her fiance.  Would like STI screen.  Urine and bowel movements normal.  Breasts normal.  Health labs and TSH/FT4 here today.  Hypothyroidism.  BMI 20.22.  Good fitness and healthy nutrition.  Past medical history,surgical history, family history and social history were all reviewed and documented in the EPIC chart.  Gynecologic History Patient's last menstrual period was 10/01/2019.  Obstetric History OB History  Gravida Para Term Preterm AB Living  0 0 0 0 0 0  SAB TAB Ectopic Multiple Live Births  0 0 0 0 0     ROS: A ROS was performed and pertinent positives and negatives are included in the history.  GENERAL: No fevers or chills. HEENT: No change in vision, no earache, sore throat or sinus congestion. NECK: No pain or stiffness. CARDIOVASCULAR: No chest pain or pressure. No palpitations. PULMONARY: No shortness of breath, cough or wheeze. GASTROINTESTINAL: No abdominal pain, nausea, vomiting or diarrhea, melena or bright red blood per rectum. GENITOURINARY: No urinary frequency, urgency, hesitancy or dysuria. MUSCULOSKELETAL: No joint or muscle pain, no back pain, no recent trauma. DERMATOLOGIC: No rash, no itching, no lesions. ENDOCRINE: No polyuria, polydipsia, no heat or cold intolerance. No recent change in weight. HEMATOLOGICAL: No anemia or easy bruising or bleeding. NEUROLOGIC: No headache, seizures, numbness, tingling or weakness. PSYCHIATRIC: No depression, no loss of interest in normal activity or change in sleep pattern.     Exam:   BP 122/70   Ht '5\' 8"'$  (1.727 m)   Wt 133 lb (60.3 kg)   LMP 10/01/2019   BMI 20.22 kg/m   Body mass index is 20.22 kg/m.  General appearance : Well developed well nourished female. No  acute distress HEENT: Eyes: no retinal hemorrhage or exudates,  Neck supple, trachea midline, no carotid bruits, no thyroidmegaly Lungs: Clear to auscultation, no rhonchi or wheezes, or rib retractions  Heart: Regular rate and rhythm, no murmurs or gallops Breast:Examined in sitting and supine position were symmetrical in appearance, no palpable masses or tenderness,  no skin retraction, no nipple inversion, no nipple discharge, no skin discoloration, no axillary or supraclavicular lymphadenopathy Abdomen: no palpable masses or tenderness, no rebound or guarding Extremities: no edema or skin discoloration or tenderness  Pelvic: Vulva: Normal             Vagina: No gross lesions or discharge  Cervix: No gross lesions or discharge.  Pap/HPV HR, Gono-Chlam done.  Uterus  AV, normal size, shape and consistency, non-tender and mobile  Adnexa  Without masses or tenderness  Anus: Normal   Assessment/Plan:  59 y.o. female for annual exam   1. Encounter for routine gynecological examination with Papanicolaou smear of cervix Normal gynecologic exam.  Pap test with high-risk HPV done today.  Breast exam normal.  Last screening mammogram July 2019 was negative, will schedule a screening mammogram now.  Colonoscopy 2013.  Fasting health labs here today.  Good body mass index at 20.22.  Continue with fitness and healthy nutrition. - CBC - Comp Met (CMET) - Lipid panel - TSH - T4, free - VITAMIN D 25 Hydroxy (Vit-D Deficiency, Fractures)  2. Perimenopause Menstrual periods are light every 1 to 3 months.  Will observe progression  into menopause.  3. Screen for STD (sexually transmitted disease) Will use condoms as needed. - onorrhea and chlamydia on Pap - HIV antibody (with reflex) - RPR - Hepatitis C Antibody - Hepatitis B Surface AntiGEN  4. Hypothyroidism, acquired, autoimmune Controlled on levothyroxine.  We will do a TSH and free T4 today.  Princess Bruins MD, 8:17 AM 11/02/2019

## 2019-11-03 DIAGNOSIS — E063 Autoimmune thyroiditis: Secondary | ICD-10-CM

## 2019-11-03 LAB — LIPID PANEL
Cholesterol: 200 mg/dL — ABNORMAL HIGH (ref <200)
HDL: 72 mg/dL (ref >=50)
LDL Cholesterol (Calc): 113 mg/dL (calc) — ABNORMAL HIGH
Non-HDL Cholesterol (Calc): 128 mg/dL (calc) (ref <130)
Total CHOL/HDL Ratio: 2.8 (calc) (ref <5.0)
Triglycerides: 61 mg/dL (ref <150)

## 2019-11-03 LAB — COMPREHENSIVE METABOLIC PANEL
AG Ratio: 1.9 (calc) (ref 1.0–2.5)
ALT: 19 U/L (ref 6–29)
AST: 18 U/L (ref 10–35)
Albumin: 4.3 g/dL (ref 3.6–5.1)
Alkaline phosphatase (APISO): 78 U/L (ref 37–153)
BUN: 10 mg/dL (ref 7–25)
CO2: 28 mmol/L (ref 20–32)
Calcium: 9.3 mg/dL (ref 8.6–10.4)
Chloride: 104 mmol/L (ref 98–110)
Creat: 0.79 mg/dL (ref 0.50–1.05)
Globulin: 2.3 g/dL (calc) (ref 1.9–3.7)
Glucose, Bld: 100 mg/dL — ABNORMAL HIGH (ref 65–99)
Potassium: 4.5 mmol/L (ref 3.5–5.3)
Sodium: 139 mmol/L (ref 135–146)
Total Bilirubin: 0.4 mg/dL (ref 0.2–1.2)
Total Protein: 6.6 g/dL (ref 6.1–8.1)

## 2019-11-03 LAB — CBC
HCT: 37 % (ref 35.0–45.0)
Hemoglobin: 12.3 g/dL (ref 11.7–15.5)
MCH: 30.1 pg (ref 27.0–33.0)
MCHC: 33.2 g/dL (ref 32.0–36.0)
MCV: 90.5 fL (ref 80.0–100.0)
MPV: 9.3 fL (ref 7.5–12.5)
Platelets: 244 10*3/uL (ref 140–400)
RBC: 4.09 10*6/uL (ref 3.80–5.10)
RDW: 12.9 % (ref 11.0–15.0)
WBC: 4.4 10*3/uL (ref 3.8–10.8)

## 2019-11-03 LAB — TSH: TSH: 0.32 mIU/L — ABNORMAL LOW (ref 0.40–4.50)

## 2019-11-03 LAB — HEPATITIS C ANTIBODY
Hepatitis C Ab: NONREACTIVE
SIGNAL TO CUT-OFF: 0.01 (ref <1.00)

## 2019-11-03 LAB — RPR: RPR Ser Ql: NONREACTIVE

## 2019-11-03 LAB — T4, FREE: Free T4: 1.7 ng/dL (ref 0.8–1.8)

## 2019-11-03 LAB — VITAMIN D 25 HYDROXY (VIT D DEFICIENCY, FRACTURES): Vit D, 25-Hydroxy: 28 ng/mL — ABNORMAL LOW (ref 30–100)

## 2019-11-03 LAB — HIV ANTIBODY (ROUTINE TESTING W REFLEX): HIV 1&2 Ab, 4th Generation: NONREACTIVE

## 2019-11-03 LAB — HEPATITIS B SURFACE ANTIGEN: Hepatitis B Surface Ag: NONREACTIVE

## 2019-11-03 NOTE — Assessment & Plan Note (Signed)
TSH is nearly suppressed on weekly cumulative dose of 700 mcg,  Will reduce dose to 600 mcg and recheck  in may.

## 2019-11-04 LAB — PAP IG, CT-NG NAA, HPV HIGH-RISK
C. trachomatis RNA, TMA: NOT DETECTED
HPV DNA High Risk: NOT DETECTED
N. gonorrhoeae RNA, TMA: NOT DETECTED

## 2019-11-19 DIAGNOSIS — Z23 Encounter for immunization: Secondary | ICD-10-CM | POA: Diagnosis not present

## 2019-11-22 ENCOUNTER — Other Ambulatory Visit: Payer: Self-pay | Admitting: Internal Medicine

## 2019-12-02 ENCOUNTER — Other Ambulatory Visit: Payer: Self-pay | Admitting: Internal Medicine

## 2019-12-02 DIAGNOSIS — Z1231 Encounter for screening mammogram for malignant neoplasm of breast: Secondary | ICD-10-CM

## 2019-12-17 ENCOUNTER — Other Ambulatory Visit: Payer: Self-pay | Admitting: Internal Medicine

## 2019-12-28 ENCOUNTER — Ambulatory Visit (INDEPENDENT_AMBULATORY_CARE_PROVIDER_SITE_OTHER): Payer: Federal, State, Local not specified - PPO | Admitting: Internal Medicine

## 2019-12-28 ENCOUNTER — Encounter: Payer: Self-pay | Admitting: Internal Medicine

## 2019-12-28 ENCOUNTER — Other Ambulatory Visit: Payer: Self-pay

## 2019-12-28 VITALS — BP 122/76 | HR 54 | Temp 97.6°F | Resp 14 | Ht 68.0 in | Wt 136.8 lb

## 2019-12-28 DIAGNOSIS — E063 Autoimmune thyroiditis: Secondary | ICD-10-CM

## 2019-12-28 DIAGNOSIS — Z0000 Encounter for general adult medical examination without abnormal findings: Secondary | ICD-10-CM

## 2019-12-28 DIAGNOSIS — Z1283 Encounter for screening for malignant neoplasm of skin: Secondary | ICD-10-CM

## 2019-12-28 LAB — THYROID PANEL WITH TSH
Free Thyroxine Index: 2.8 (ref 1.4–3.8)
T3 Uptake: 35 % (ref 22–35)
T4, Total: 8.1 ug/dL (ref 5.1–11.9)
TSH: 3.69 mIU/L (ref 0.40–4.50)

## 2019-12-28 MED ORDER — DOXYCYCLINE HYCLATE 100 MG PO TABS: 100.0000 mg | ORAL_TABLET | Freq: Two times a day (BID) | ORAL | 0 refills | Status: DC

## 2019-12-28 NOTE — Patient Instructions (Signed)
Good to see youy!!  Resume 1000 Ius of Vit D3 daily (find the gummy bears)   You are safe to get your hair permed!   Health Maintenance for Postmenopausal Women Menopause is a normal process in which your ability to get pregnant comes to an end. This process happens slowly over many months or years, usually between the ages of 65 and 30. Menopause is complete when you have missed your menstrual periods for 12 months. It is important to talk with your health care provider about some of the most common conditions that affect women after menopause (postmenopausal women). These include heart disease, cancer, and bone loss (osteoporosis). Adopting a healthy lifestyle and getting preventive care can help to promote your health and wellness. The actions you take can also lower your chances of developing some of these common conditions. What should I know about menopause? During menopause, you may get a number of symptoms, such as:  Hot flashes. These can be moderate or severe.  Night sweats.  Decrease in sex drive.  Mood swings.  Headaches.  Tiredness.  Irritability.  Memory problems.  Insomnia. Choosing to treat or not to treat these symptoms is a decision that you make with your health care provider. Do I need hormone replacement therapy?  Hormone replacement therapy is effective in treating symptoms that are caused by menopause, such as hot flashes and night sweats.  Hormone replacement carries certain risks, especially as you become older. If you are thinking about using estrogen or estrogen with progestin, discuss the benefits and risks with your health care provider. What is my risk for heart disease and stroke? The risk of heart disease, heart attack, and stroke increases as you age. One of the causes may be a change in the body's hormones during menopause. This can affect how your body uses dietary fats, triglycerides, and cholesterol. Heart attack and stroke are medical  emergencies. There are many things that you can do to help prevent heart disease and stroke. Watch your blood pressure  High blood pressure causes heart disease and increases the risk of stroke. This is more likely to develop in people who have high blood pressure readings, are of African descent, or are overweight.  Have your blood pressure checked: ? Every 3-5 years if you are 76-68 years of age. ? Every year if you are 105 years old or older. Eat a healthy diet   Eat a diet that includes plenty of vegetables, fruits, low-fat dairy products, and lean protein.  Do not eat a lot of foods that are high in solid fats, added sugars, or sodium. Get regular exercise Get regular exercise. This is one of the most important things you can do for your health. Most adults should:  Try to exercise for at least 150 minutes each week. The exercise should increase your heart rate and make you sweat (moderate-intensity exercise).  Try to do strengthening exercises at least twice each week. Do these in addition to the moderate-intensity exercise.  Spend less time sitting. Even light physical activity can be beneficial. Other tips  Work with your health care provider to achieve or maintain a healthy weight.  Do not use any products that contain nicotine or tobacco, such as cigarettes, e-cigarettes, and chewing tobacco. If you need help quitting, ask your health care provider.  Know your numbers. Ask your health care provider to check your cholesterol and your blood sugar (glucose). Continue to have your blood tested as directed by your health care provider.  Do I need screening for cancer? Depending on your health history and family history, you may need to have cancer screening at different stages of your life. This may include screening for:  Breast cancer.  Cervical cancer.  Lung cancer.  Colorectal cancer. What is my risk for osteoporosis? After menopause, you may be at increased risk for  osteoporosis. Osteoporosis is a condition in which bone destruction happens more quickly than new bone creation. To help prevent osteoporosis or the bone fractures that can happen because of osteoporosis, you may take the following actions:  If you are 22-58 years old, get at least 1,000 mg of calcium and at least 600 mg of vitamin D per day.  If you are older than age 67 but younger than age 50, get at least 1,200 mg of calcium and at least 600 mg of vitamin D per day.  If you are older than age 3, get at least 1,200 mg of calcium and at least 800 mg of vitamin D per day. Smoking and drinking excessive alcohol increase the risk of osteoporosis. Eat foods that are rich in calcium and vitamin D, and do weight-bearing exercises several times each week as directed by your health care provider. How does menopause affect my mental health? Depression may occur at any age, but it is more common as you become older. Common symptoms of depression include:  Low or sad mood.  Changes in sleep patterns.  Changes in appetite or eating patterns.  Feeling an overall lack of motivation or enjoyment of activities that you previously enjoyed.  Frequent crying spells. Talk with your health care provider if you think that you are experiencing depression. General instructions See your health care provider for regular wellness exams and vaccines. This may include:  Scheduling regular health, dental, and eye exams.  Getting and maintaining your vaccines. These include: ? Influenza vaccine. Get this vaccine each year before the flu season begins. ? Pneumonia vaccine. ? Shingles vaccine. ? Tetanus, diphtheria, and pertussis (Tdap) booster vaccine. Your health care provider may also recommend other immunizations. Tell your health care provider if you have ever been abused or do not feel safe at home. Summary  Menopause is a normal process in which your ability to get pregnant comes to an end.  This  condition causes hot flashes, night sweats, decreased interest in sex, mood swings, headaches, or lack of sleep.  Treatment for this condition may include hormone replacement therapy.  Take actions to keep yourself healthy, including exercising regularly, eating a healthy diet, watching your weight, and checking your blood pressure and blood sugar levels.  Get screened for cancer and depression. Make sure that you are up to date with all your vaccines. This information is not intended to replace advice given to you by your health care provider. Make sure you discuss any questions you have with your health care provider. Document Revised: 07/16/2018 Document Reviewed: 07/16/2018 Elsevier Patient Education  2020 [employer].

## 2019-12-28 NOTE — Assessment & Plan Note (Signed)

## 2019-12-28 NOTE — Assessment & Plan Note (Addendum)
Dose was adjusted by 100 mcg weekly for overactive thyroid in March and repeat TSH is done today  Lab Results  Component Value Date   TSH 0.32 (L) 11/02/2019

## 2019-12-28 NOTE — Progress Notes (Signed)
Patient ID: Rebecca Cole, female    DOB: 01-26-1961  Age: 59 y.o. MRN: [id number]  The patient is here for annual  Non gyn examination and management of other chronic and acute problems.  This visit occurred during the SARS-CoV-2 public health emergency.  Safety protocols were in place, including screening questions prior to the visit, additional usage of staff PPE, and extensive cleaning of exam room while observing appropriate contact time as indicated for disinfecting solutions.    Patient has received both doses of the available COVID 19 vaccine without complications.  Patient continues to mask when outside of the home except when walking in yard or at safe distances from others .  Patient denies any change in mood or development of unhealthy behaviors resuting from the pandemic's restriction of activities and socialization.     The risk factors are reflected in the social history.  The roster of all physicians providing medical care to patient - is listed in the Snapshot section of the chart.  Activities of daily living:  The patient is 100% independent in all ADLs: dressing, toileting, feeding as well as independent mobility  Home safety : The patient has smoke detectors in the home. They wear seatbelts.  There are no firearms at home. There is no violence in the home.   There is no risks for hepatitis, STDs or HIV. There is no   history of blood transfusion. They have no travel history to infectious disease endemic areas of the world.  The patient has seen their dentist in the last six month. They have seen their eye doctor in the last year.   They do not  have excessive sun exposure. Discussed the need for sun protection: hats, long sleeves and use of sunscreen if there is significant sun exposure.   Diet: the importance of a healthy diet is discussed. They do have a healthy diet.  The benefits of regular aerobic exercise were discussed. She has not kayaked since the Pandemic but is  remaining physically active . Marland Kitchen   Depression screen: there are no signs or vegative symptoms of depression- irritability, change in appetite, anhedonia, sadness/tearfullness.  Cognitive assessment: the patient manages all their financial and personal affairs and is actively engaged. They could relate day,date,year and events; recalled 3/3 objects at 3 minutes; performed clock-face test normally.  The following portions of the patient's history were reviewed and updated as appropriate: allergies, current medications, past family history, past medical history,  past surgical history, past social history  and problem list.  Visual acuity was not assessed per patient preference since she has regular follow up with her ophthalmologist. Hearing and body mass index were assessed and reviewed.   During the course of the visit the patient was educated and counseled about appropriate screening and preventive services including : fall prevention , diabetes screening, nutrition counseling, colorectal cancer screening, and recommended immunizations.    CC: The primary encounter diagnosis was Screening for skin cancer. Diagnoses of Hypothyroidism, acquired, autoimmune and Routine general medical examination at a health care facility were also pertinent to this visit.  [name] has a past medical history of Graves Disease (Sept 2010) and Graves' orbitopathy (2010).   She has a past surgical history that includes Breast biopsy (2006) and Breast cyst excision (Right, 2006).   Her family history includes Breast cancer in her maternal grandmother; Cancer in her father and maternal grandmother; Heart disease in her father.She reports that she has never smoked. She has never used smokeless  tobacco. She reports current alcohol use. She reports that she does not use drugs.  Outpatient Medications Prior to Visit  Medication Sig Dispense Refill  . Cholecalciferol (VITAMIN D3) 1000 UNITS CAPS Take 1 capsule by  mouth daily.      . fluticasone (FLONASE) 50 MCG/ACT nasal spray Place 2 sprays into both nostrils daily. 16 g 2  . levothyroxine (SYNTHROID) 100 MCG tablet TAKE 1 TABLET BY MOUTH DAILY BEFORE BREAKFAST. 30 tablet 0  . ketoconazole (NIZORAL) 2 % cream APPLY 1 APPLICATION TOPICALLY DAILY. (Patient not taking: Reported on 11/02/2019) 15 g 0   No facility-administered medications prior to visit.    Review of Systems   Patient denies headache, fevers, malaise, unintentional weight loss, skin rash, eye pain, sinus congestion and sinus pain, sore throat, dysphagia,  hemoptysis , cough, dyspnea, wheezing, chest pain, palpitations, orthopnea, edema, abdominal pain, nausea, melena, diarrhea, constipation, flank pain, dysuria, hematuria, urinary  Frequency, nocturia, numbness, tingling, seizures,  Focal weakness, Loss of consciousness,  Tremor, insomnia, depression, anxiety, and suicidal ideation.      Objective:  BP 122/76 (BP Location: Left Arm, Patient Position: Sitting, Cuff Size: Normal)   Pulse (!) 54   Temp 97.6 F (36.4 C) (Temporal)   Resp 14   Ht 5\' 8"  (1.727 m)   Wt 136 lb 12.8 oz (62.1 kg)   SpO2 97%   BMI 20.80 kg/m   Physical Exam   General appearance: alert, cooperative and appears stated age Ears: normal TM's and external ear canals both ears Throat: lips, mucosa, and tongue normal; teeth and gums normal Neck: no adenopathy, no carotid bruit, supple, symmetrical, trachea midline and thyroid not enlarged, symmetric, no tenderness/mass/nodules Back: symmetric, no curvature. ROM normal. No CVA tenderness. Lungs: clear to auscultation bilaterally Heart: regular rate and rhythm, S1, S2 normal, no murmur, click, rub or gallop Abdomen: soft, non-tender; bowel sounds normal; no masses,  no organomegaly Pulses: 2+ and symmetric Skin: Skin color, texture, turgor normal. No rashes or lesions Lymph nodes: Cervical, supraclavicular, and axillary nodes normal.    Assessment & Plan:    Problem List Items Addressed This Visit      Unprioritized   Hypothyroidism, acquired, autoimmune    Dose was adjusted by 100 mcg weekly for overactive thyroid in March and repeat TSH is done today  Lab Results  Component Value Date   TSH 0.32 (L) 11/02/2019         Relevant Orders   Thyroid Panel With TSH   Routine general medical examination at a health care facility    age appropriate education and counseling updated, referrals for preventative services and immunizations addressed, dietary and smoking counseling addressed, most recent labs reviewed.  I have personally reviewed and have noted:  1) the patient's medical and social history 2) The pt's use of alcohol, tobacco, and illicit drugs 3) The patient's current medications and supplements 4) Functional ability including ADL's, fall risk, home safety risk, hearing and visual impairment 5) Diet and physical activities 6) Evidence for depression or mood disorder 7) The patient's height, weight, and BMI have been recorded in the chart  I have made referrals, and provided counseling and education based on review of the above       Other Visit Diagnoses    Screening for skin cancer    -  Primary   Relevant Orders   Ambulatory referral to Dermatology      I am having Rebecca Cole start on doxycycline. I am also having  her maintain her Vitamin D3, fluticasone, ketoconazole, and levothyroxine.  Meds ordered this encounter  Medications  . doxycycline (VIBRA-TABS) 100 MG tablet    Sig: Take 1 tablet (100 mg total) by mouth 2 (two) times daily.    Dispense:  20 tablet    Refill:  0    There are no discontinued medications.  Follow-up: No follow-ups on file.   [name], MD

## 2019-12-30 ENCOUNTER — Other Ambulatory Visit: Payer: Self-pay | Admitting: Internal Medicine

## 2019-12-30 DIAGNOSIS — E063 Autoimmune thyroiditis: Secondary | ICD-10-CM

## 2019-12-30 MED ORDER — LEVOTHYROXINE SODIUM 100 MCG PO TABS: ORAL_TABLET | ORAL | 0 refills | Status: DC

## 2019-12-30 NOTE — Assessment & Plan Note (Signed)
Increase weekly cumulative dose to 650 mcg given TSH > 3 on 600 mcg weekly  Lab Results  Component Value Date   TSH 3.69 12/28/2019

## 2020-01-12 ENCOUNTER — Other Ambulatory Visit: Payer: Self-pay | Admitting: Obstetrics & Gynecology

## 2020-01-12 DIAGNOSIS — Z1231 Encounter for screening mammogram for malignant neoplasm of breast: Secondary | ICD-10-CM

## 2020-01-13 ENCOUNTER — Telehealth: Payer: Self-pay | Admitting: Internal Medicine

## 2020-01-13 ENCOUNTER — Ambulatory Visit
Admission: EM | Admit: 2020-01-13 | Discharge: 2020-01-13 | Disposition: A | Discharge status: Home / Self Care | Payer: Federal, State, Local not specified - PPO | Attending: Emergency Medicine | Admitting: Emergency Medicine

## 2020-01-13 DIAGNOSIS — T7840XA Allergy, unspecified, initial encounter: Secondary | ICD-10-CM

## 2020-01-13 DIAGNOSIS — T63481A Toxic effect of venom of other arthropod, accidental (unintentional), initial encounter: Secondary | ICD-10-CM

## 2020-01-13 MED ORDER — DIPHENHYDRAMINE HCL 50 MG PO CAPS
50.0000 mg | ORAL_CAPSULE | Freq: Once | ORAL | Status: AC
  Administered 2020-01-13: 50 mg via ORAL

## 2020-01-13 MED ORDER — PREDNISONE 10 MG (21) PO TBPK: ORAL_TABLET | Freq: Every day | ORAL | 0 refills | Status: DC

## 2020-01-13 MED ORDER — METHYLPREDNISOLONE SODIUM SUCC 125 MG IJ SOLR
125.0000 mg | Freq: Once | INTRAMUSCULAR | Status: AC
  Administered 2020-01-13: 125 mg via INTRAMUSCULAR

## 2020-01-13 MED ORDER — DIPHENHYDRAMINE HCL 25 MG PO TABS: 50.0000 mg | ORAL_TABLET | Freq: Four times a day (QID) | ORAL | 0 refills | Status: DC | PRN

## 2020-01-13 NOTE — Telephone Encounter (Signed)
Spoken to patient, she was stung in the hand and now her hand is swelling and becoming red. She stated the redness and swelling is traveling up her arm.  She is unable to take benadryl due to her not having any. She is not have SOB, Throat swelling, Hives. I instructed due to her sx to go to urgent care so she can receive tx for her allergic reaction. She stated she will comply. She is going to the [facility] across from PCP.

## 2020-01-13 NOTE — Discharge Instructions (Addendum)
Go to the emergency department right away if you have difficulty swallowing or breathing; Or if you have increased redness or swelling.    You were given an injection of a steroid called Solu-Medrol.  Start taking the prednisone in the morning as directed.    Take Benadryl 25-50 mg by mouth every 6 hours for 2-3 days.     Call your primary care provider to let them know that you have had a reaction to an insect sting.

## 2020-01-13 NOTE — Telephone Encounter (Signed)
Pt was stung by something and her hand is swelling

## 2020-01-13 NOTE — ED Triage Notes (Signed)
Around 1330, sharp pain at the base of the fourth phalange on the right hand followed by redness and swelling. Reports that swelling and erythema has continued to spread up the arm over the last three hours. Patient did not take benadryl because she was out. She believes it to be either an insect bite or a spider bite but no visible puncture wounds present.

## 2020-01-13 NOTE — ED Provider Notes (Signed)
[name]    CSN: [id number] Arrival date & time: 01/13/20  1623      History   Chief Complaint Chief Complaint  Patient presents with  . Insect Bite    HPI Rebecca Cole is a 59 y.o. female.  Patient presents with redness and swelling in her right hand and forearm since this afternoon.  She was outside and felt a sharp sting on her hand; She did not see what stung her.  She denies difficulty swallowing or breathing. She has not previously had a reaction to an insect bite or sting.  She denies fever, chills, arthralgias, myalgias, abdominal pain, headache, or other symptoms.  No treatments attempted at home.   The history is provided by the patient.    Past Medical History:  Diagnosis Date  . Graves Disease Sept 2010   s/p 131 ablation, managed by [facility] medical assoicates Dr. Bubba Camp  . Graves' orbitopathy 2010   managed by [facility]    Patient Active Problem List   Diagnosis Date Noted  . Impaired fasting glucose 02/01/2018  . Nonallopathic lesion of cervical region 08/20/2017  . Nonallopathic lesion of thoracic region 08/20/2017  . Nonallopathic lesion of lumbosacral region 08/20/2017  . Pes planus 08/20/2017  . Cervical radiculopathy at C5 11/07/2016  . Eustachian tube disorder, left 05/08/2016  . Vitamin D deficiency 01/04/2014  . Routine general medical examination at a health care facility 01/08/2012  . Screening for colon cancer 01/08/2012  . Graves' orbitopathy   . Hypothyroidism, acquired, autoimmune 04/06/2009    Past Surgical History:  Procedure Laterality Date  . BREAST BIOPSY  2006   negative  . BREAST CYST EXCISION Right 2006    OB History    Gravida  0   Para  0   Term  0   Preterm  0   AB  0   Living  0     SAB  0   TAB  0   Ectopic  0   Multiple  0   Live Births  0            Home Medications    Prior to Admission medications   Medication Sig Start Date End Date Taking? Authorizing Provider    Cholecalciferol (VITAMIN D3) 1000 UNITS CAPS Take 1 capsule by mouth daily.      [provider]  diphenhydrAMINE (BENADRYL) 25 MG tablet Take 2 tablets (50 mg total) by mouth every 6 (six) hours as needed. 01/13/20   Sharion Balloon, NP  doxycycline (VIBRA-TABS) 100 MG tablet Take 1 tablet (100 mg total) by mouth 2 (two) times daily. 12/28/19   [name], MD  fluticasone (FLONASE) 50 MCG/ACT nasal spray Place 2 sprays into both nostrils daily. 05/08/16   Burnard Hawthorne, FNP  ketoconazole (NIZORAL) 2 % cream APPLY 1 APPLICATION TOPICALLY DAILY. Patient not taking: Reported on 11/02/2019 05/07/17   [name], MD  levothyroxine (SYNTHROID) 100 MCG tablet One tablet daily before breakfast,  1/2 on Sundays 12/30/19   [name], MD  predniSONE (STERAPRED UNI-PAK 21 TAB) 10 MG (21) TBPK tablet Take by mouth daily. Take 6 tabs by mouth daily  for 1 day, then 5 tabs for 1 day, then 4 tabs for 1 day, then 3 tabs for 1 day, 2 tabs for 1 day, then 1 tab by mouth daily for 1 day 01/13/20   Sharion Balloon, NP    Family History Family History  Problem Relation Age of Onset  . Heart disease Father        CABG, Ablation for Atrial Flutter, valve replacement, pacemaker, pericardial effusion s/p window  . Cancer Father        late 17's history of colon CA  . Cancer Maternal Grandmother        possibly benign  . Breast cancer Maternal Grandmother     Social History Social History   Tobacco Use  . Smoking status: Never Smoker  . Smokeless tobacco: Never Used  Substance Use Topics  . Alcohol use: Yes    Alcohol/week: 0.0 standard drinks    Comment: moderate  . Drug use: No     Allergies   Methimazole   Review of Systems Review of Systems  Constitutional: Negative for chills and fever.  HENT: Negative for ear pain and sore throat.   Eyes: Negative for pain and visual disturbance.  Respiratory: Negative for cough and shortness of breath.   Cardiovascular: Negative for  chest pain and palpitations.  Gastrointestinal: Negative for abdominal pain and vomiting.  Genitourinary: Negative for dysuria and hematuria.  Musculoskeletal: Negative for arthralgias and back pain.  Skin: Positive for color change and wound.  Neurological: Negative for seizures and syncope.  All other systems reviewed and are negative.    Physical Exam Triage Vital Signs ED Triage Vitals  Enc Vitals Group     BP 01/13/20 1628 131/87     Pulse Rate 01/13/20 1628 (!) 57     Resp 01/13/20 1628 16     Temp 01/13/20 1628 98.6 F (37 C)     Temp Source 01/13/20 1628 Oral     SpO2 01/13/20 1628 98 %     Weight --      Height --      Head Circumference --      Peak Flow --      Pain Score 01/13/20 1627 1     Pain Loc --      Pain Edu? --      Excl. in [place]? --    No data found.  Updated Vital Signs BP 131/87 (BP Location: Left Arm)   Pulse (!) 57   Temp 98.6 F (37 C) (Oral)   Resp 16   SpO2 98%   Visual Acuity Right Eye Distance:   Left Eye Distance:   Bilateral Distance:    Right Eye Near:   Left Eye Near:    Bilateral Near:     Physical Exam Vitals and nursing note reviewed.  Constitutional:      General: She is not in acute distress.    Appearance: She is well-developed.  HENT:     Head: Normocephalic and atraumatic.     Mouth/Throat:     Mouth: Mucous membranes are moist.     Pharynx: Oropharynx is clear.     Comments: Speech clear.  No difficulty swallowing.  Eyes:     Conjunctiva/sclera: Conjunctivae normal.  Cardiovascular:     Rate and Rhythm: Normal rate and regular rhythm.     Heart sounds: No murmur.  Pulmonary:     Effort: Pulmonary effort is normal. No respiratory distress.     Breath sounds: Normal breath sounds. No stridor. No wheezing or rhonchi.  Abdominal:     Palpations: Abdomen is soft.     Tenderness: There is no abdominal tenderness.  Musculoskeletal:        General: Swelling and tenderness present.     Cervical back: Neck  supple.  Skin:    General: Skin is warm and dry.     Findings: Erythema present.     Comments: Erythema and edema of right hand and forearm.  See pictures for details.   Neurological:     General: No focal deficit present.     Mental Status: She is alert and oriented to person, place, and time.     Sensory: No sensory deficit.     Motor: No weakness.     Gait: Gait normal.  Psychiatric:        Mood and Affect: Mood normal.        Behavior: Behavior normal.          UC Treatments / Results  Labs (all labs ordered are listed, but only abnormal results are displayed) Labs Reviewed - No data to display  EKG   Radiology No results found.  Procedures Procedures (including critical care time)  Medications Ordered in UC Medications  methylPREDNISolone sodium succinate (SOLU-MEDROL) 125 mg/2 mL injection 125 mg (125 mg Intramuscular Given 01/13/20 1702)  diphenhydrAMINE (BENADRYL) capsule 50 mg (50 mg Oral Given 01/13/20 1702)    Initial Impression / Assessment and Plan / UC Course  I have reviewed the triage vital signs and the nursing notes.  Pertinent labs & imaging results that were available during my care of the patient were reviewed by me and considered in my medical decision making (see chart for details).   Insect sting; allergic reaction.  Given Solu-medrol here; start prednisone in the morning.  Instructed patient to take 25-50 mg of Benadryl every 6 hours x 2-3 days.  Precautions for drowsiness with Benadryl discussed with patient.  Discussed that she can take Zyrtec instead if she cannot be drowsy.  Instructed patient to go to the ED if she has difficulty swallowing or breathing; Or has increased redness or swelling.  Patient agrees to plan of care.    Final Clinical Impressions(s) / UC Diagnoses   Final diagnoses:  Insect stings, accidental or unintentional, initial encounter  Allergic reaction, initial encounter     Discharge Instructions     Go to the  emergency department right away if you have difficulty swallowing or breathing; Or if you have increased redness or swelling.    You were given an injection of a steroid called Solu-Medrol.  Start taking the prednisone in the morning as directed.    Take Benadryl 25-50 mg by mouth every 6 hours for 2-3 days.     Call your primary care provider to let them know that you have had a reaction to an insect sting.          ED Prescriptions    Medication Sig Dispense Auth. Provider   predniSONE (STERAPRED UNI-PAK 21 TAB) 10 MG (21) TBPK tablet Take by mouth daily. Take 6 tabs by mouth daily  for 1 day, then 5 tabs for 1 day, then 4 tabs for 1 day, then 3 tabs for 1 day, 2 tabs for 1 day, then 1 tab by mouth daily for 1 day 21 tablet Sharion Balloon, NP   diphenhydrAMINE (BENADRYL) 25 MG tablet Take 2 tablets (50 mg total) by mouth every 6 (six) hours as needed. 30 tablet Sharion Balloon, NP     PDMP not reviewed this encounter.   Sharion Balloon, NP 01/13/20 1730

## 2020-01-16 ENCOUNTER — Other Ambulatory Visit: Payer: Self-pay | Admitting: Internal Medicine

## 2020-01-19 ENCOUNTER — Other Ambulatory Visit: Payer: Self-pay | Admitting: Internal Medicine

## 2020-01-19 MED ORDER — PREDNISONE 50 MG PO TABS: ORAL_TABLET | ORAL | 2 refills | Status: DC

## 2020-01-19 MED ORDER — EPINEPHRINE 0.3 MG/0.3ML IJ SOAJ: 0.3000 mg | INTRAMUSCULAR | 1 refills | Status: DC | PRN

## 2020-01-29 ENCOUNTER — Other Ambulatory Visit: Payer: Self-pay

## 2020-01-29 ENCOUNTER — Ambulatory Visit
Admission: RE | Admit: 2020-01-29 | Discharge: 2020-01-29 | Disposition: A | Discharge status: Home / Self Care | Payer: Federal, State, Local not specified - PPO | Source: Ambulatory Visit | Attending: Obstetrics & Gynecology | Admitting: Obstetrics & Gynecology

## 2020-01-29 DIAGNOSIS — Z1231 Encounter for screening mammogram for malignant neoplasm of breast: Secondary | ICD-10-CM | POA: Diagnosis not present

## 2020-03-04 ENCOUNTER — Other Ambulatory Visit: Payer: Self-pay | Admitting: Internal Medicine

## 2020-03-04 ENCOUNTER — Other Ambulatory Visit: Payer: Self-pay

## 2020-03-04 ENCOUNTER — Other Ambulatory Visit (INDEPENDENT_AMBULATORY_CARE_PROVIDER_SITE_OTHER): Payer: Federal, State, Local not specified - PPO

## 2020-03-04 DIAGNOSIS — E063 Autoimmune thyroiditis: Secondary | ICD-10-CM | POA: Diagnosis not present

## 2020-03-05 LAB — THYROID PANEL WITH TSH
Free Thyroxine Index: 3 (ref 1.4–3.8)
T3 Uptake: 36 % — ABNORMAL HIGH (ref 22–35)
T4, Total: 8.4 ug/dL (ref 5.1–11.9)
TSH: 1.46 mIU/L (ref 0.40–4.50)

## 2020-03-07 ENCOUNTER — Other Ambulatory Visit: Payer: Federal, State, Local not specified - PPO

## 2020-03-09 DIAGNOSIS — Z20822 Contact with and (suspected) exposure to covid-19: Secondary | ICD-10-CM | POA: Diagnosis not present

## 2020-03-09 DIAGNOSIS — Z03818 Encounter for observation for suspected exposure to other biological agents ruled out: Secondary | ICD-10-CM | POA: Diagnosis not present

## 2020-03-21 ENCOUNTER — Telehealth: Payer: Self-pay | Admitting: Internal Medicine

## 2020-03-21 NOTE — Telephone Encounter (Signed)
Rejection Reason - Patient did not respond - LEFT MESSAGE 12/30/19 - NO RESPONSE FROM PATIENT " [facility] PA said 9 minutes ago

## 2020-04-10 ENCOUNTER — Other Ambulatory Visit: Payer: Self-pay | Admitting: Internal Medicine

## 2020-05-17 ENCOUNTER — Other Ambulatory Visit: Payer: Self-pay | Admitting: Internal Medicine

## 2020-05-26 ENCOUNTER — Other Ambulatory Visit: Payer: Self-pay

## 2020-06-17 ENCOUNTER — Other Ambulatory Visit: Payer: Self-pay | Admitting: Internal Medicine

## 2020-07-14 ENCOUNTER — Other Ambulatory Visit: Payer: Self-pay | Admitting: Internal Medicine

## 2020-08-09 ENCOUNTER — Other Ambulatory Visit: Payer: Self-pay | Admitting: Internal Medicine

## 2020-08-31 ENCOUNTER — Other Ambulatory Visit: Payer: Self-pay | Admitting: Internal Medicine

## 2020-09-26 ENCOUNTER — Other Ambulatory Visit: Payer: Self-pay | Admitting: Internal Medicine

## 2020-09-26 NOTE — Telephone Encounter (Signed)
Patient last TSH 3/21 has an appointment scheduled for 12/28/20 ok to refill levothyroxine for 90 days?

## 2020-09-29 ENCOUNTER — Telehealth: Payer: Self-pay

## 2020-09-29 DIAGNOSIS — M999 Biomechanical lesion, unspecified: Secondary | ICD-10-CM

## 2020-09-29 DIAGNOSIS — E063 Autoimmune thyroiditis: Secondary | ICD-10-CM

## 2020-09-29 NOTE — Telephone Encounter (Signed)
Pt would like an order placed to check her TSH. Please call

## 2020-09-29 NOTE — Telephone Encounter (Signed)
Yes thyroid panel ordered.  No appt needed

## 2020-09-29 NOTE — Telephone Encounter (Signed)
Called and spoke to [place]. Rebecca Cole states that her TSH never stabilizes and that she had a previous discuss with Dr. Derrel Nip to call whenever she would like to recheck her TSH. Rebecca Cole states she does not feel at her best and it is similar to when her TSH is abnormal and would like to come in for a check. OK to order labs? Rebecca Cole declined an appointment with Dr.Tullo as she has a upcoming physical in may 2022.

## 2020-09-30 ENCOUNTER — Other Ambulatory Visit: Payer: Self-pay

## 2020-09-30 ENCOUNTER — Other Ambulatory Visit (INDEPENDENT_AMBULATORY_CARE_PROVIDER_SITE_OTHER): Payer: Federal, State, Local not specified - PPO

## 2020-09-30 DIAGNOSIS — E063 Autoimmune thyroiditis: Secondary | ICD-10-CM

## 2020-09-30 NOTE — Telephone Encounter (Signed)
Called and scheduled patient for labs today, 09/30/2020 at 2:45pm

## 2020-10-01 ENCOUNTER — Other Ambulatory Visit: Payer: Self-pay | Admitting: Internal Medicine

## 2020-10-01 DIAGNOSIS — E063 Autoimmune thyroiditis: Secondary | ICD-10-CM

## 2020-10-01 LAB — THYROID PANEL WITH TSH
Free Thyroxine Index: 3.2 (ref 1.4–3.8)
T3 Uptake: 36 % — ABNORMAL HIGH (ref 22–35)
T4, Total: 8.8 ug/dL (ref 5.1–11.9)
TSH: 0.36 mIU/L — ABNORMAL LOW (ref 0.40–4.50)

## 2020-10-01 MED ORDER — LEVOTHYROXINE SODIUM 88 MCG PO TABS: ORAL_TABLET | ORAL | 0 refills | Status: DC

## 2020-10-01 NOTE — Assessment & Plan Note (Signed)
Thyroid function is overactive on current dose of 100 mcg daily  Patient advised to reduce dose to 100 mcg x 6 days per week followed by reduction to 88 mcg daily.   I have sent the lower dose of levothyroxine to her pharmacy and ordered repeat Thyroid panel for mid April

## 2020-10-11 DIAGNOSIS — F4323 Adjustment disorder with mixed anxiety and depressed mood: Secondary | ICD-10-CM | POA: Diagnosis not present

## 2020-10-20 DIAGNOSIS — F4323 Adjustment disorder with mixed anxiety and depressed mood: Secondary | ICD-10-CM | POA: Diagnosis not present

## 2020-10-29 ENCOUNTER — Other Ambulatory Visit: Payer: Self-pay | Admitting: Internal Medicine

## 2020-11-01 DIAGNOSIS — F4323 Adjustment disorder with mixed anxiety and depressed mood: Secondary | ICD-10-CM | POA: Diagnosis not present

## 2020-11-07 ENCOUNTER — Encounter: Payer: Federal, State, Local not specified - PPO | Admitting: Obstetrics & Gynecology

## 2020-11-15 DIAGNOSIS — F4323 Adjustment disorder with mixed anxiety and depressed mood: Secondary | ICD-10-CM | POA: Diagnosis not present

## 2020-12-06 DIAGNOSIS — F4323 Adjustment disorder with mixed anxiety and depressed mood: Secondary | ICD-10-CM | POA: Diagnosis not present

## 2020-12-08 ENCOUNTER — Other Ambulatory Visit (INDEPENDENT_AMBULATORY_CARE_PROVIDER_SITE_OTHER): Payer: Federal, State, Local not specified - PPO

## 2020-12-08 ENCOUNTER — Other Ambulatory Visit: Payer: Self-pay

## 2020-12-08 DIAGNOSIS — E063 Autoimmune thyroiditis: Secondary | ICD-10-CM

## 2020-12-09 LAB — THYROID PANEL WITH TSH
Free Thyroxine Index: 2.9 (ref 1.4–3.8)
T3 Uptake: 35 % (ref 22–35)
T4, Total: 8.2 ug/dL (ref 5.1–11.9)
TSH: 0.64 mIU/L (ref 0.40–4.50)

## 2020-12-11 ENCOUNTER — Other Ambulatory Visit: Payer: Self-pay | Admitting: Internal Medicine

## 2020-12-11 MED ORDER — LEVOTHYROXINE SODIUM 88 MCG PO TABS: 88.0000 ug | ORAL_TABLET | Freq: Every day | ORAL | 0 refills | Status: DC

## 2020-12-28 ENCOUNTER — Encounter: Payer: Self-pay | Admitting: Internal Medicine

## 2020-12-28 ENCOUNTER — Other Ambulatory Visit: Payer: Self-pay

## 2020-12-28 ENCOUNTER — Ambulatory Visit (INDEPENDENT_AMBULATORY_CARE_PROVIDER_SITE_OTHER): Payer: Federal, State, Local not specified - PPO | Admitting: Internal Medicine

## 2020-12-28 ENCOUNTER — Ambulatory Visit (INDEPENDENT_AMBULATORY_CARE_PROVIDER_SITE_OTHER): Payer: Federal, State, Local not specified - PPO

## 2020-12-28 VITALS — BP 116/88 | HR 58 | Temp 96.9°F | Resp 13 | Ht 68.0 in | Wt 139.2 lb

## 2020-12-28 DIAGNOSIS — M2142 Flat foot [pes planus] (acquired), left foot: Secondary | ICD-10-CM

## 2020-12-28 DIAGNOSIS — R5383 Other fatigue: Secondary | ICD-10-CM | POA: Diagnosis not present

## 2020-12-28 DIAGNOSIS — Z0000 Encounter for general adult medical examination without abnormal findings: Secondary | ICD-10-CM | POA: Diagnosis not present

## 2020-12-28 DIAGNOSIS — E559 Vitamin D deficiency, unspecified: Secondary | ICD-10-CM | POA: Diagnosis not present

## 2020-12-28 DIAGNOSIS — M542 Cervicalgia: Secondary | ICD-10-CM

## 2020-12-28 DIAGNOSIS — M2141 Flat foot [pes planus] (acquired), right foot: Secondary | ICD-10-CM

## 2020-12-28 DIAGNOSIS — E65 Localized adiposity: Secondary | ICD-10-CM | POA: Diagnosis not present

## 2020-12-28 DIAGNOSIS — E063 Autoimmune thyroiditis: Secondary | ICD-10-CM

## 2020-12-28 DIAGNOSIS — M5412 Radiculopathy, cervical region: Secondary | ICD-10-CM

## 2020-12-28 DIAGNOSIS — R7301 Impaired fasting glucose: Secondary | ICD-10-CM

## 2020-12-28 DIAGNOSIS — Z9229 Personal history of other drug therapy: Secondary | ICD-10-CM | POA: Diagnosis not present

## 2020-12-28 DIAGNOSIS — E785 Hyperlipidemia, unspecified: Secondary | ICD-10-CM | POA: Diagnosis not present

## 2020-12-28 LAB — COMPREHENSIVE METABOLIC PANEL
ALT: 19 U/L (ref 0–35)
AST: 27 U/L (ref 0–37)
Albumin: 4.4 g/dL (ref 3.5–5.2)
Alkaline Phosphatase: 85 U/L (ref 39–117)
BUN: 12 mg/dL (ref 6–23)
CO2: 30 mEq/L (ref 19–32)
Calcium: 9.2 mg/dL (ref 8.4–10.5)
Chloride: 104 mEq/L (ref 96–112)
Creatinine, Ser: 0.73 mg/dL (ref 0.40–1.20)
GFR: 90.6 mL/min (ref >60.00)
Glucose, Bld: 96 mg/dL (ref 70–99)
Potassium: 4 mEq/L (ref 3.5–5.1)
Sodium: 140 mEq/L (ref 135–145)
Total Bilirubin: 0.4 mg/dL (ref 0.2–1.2)
Total Protein: 6.5 g/dL (ref 6.0–8.3)

## 2020-12-28 LAB — LIPID PANEL
Cholesterol: 234 mg/dL — ABNORMAL HIGH (ref 0–200)
HDL: 68.9 mg/dL (ref >39.00)
LDL Cholesterol: 152 mg/dL — ABNORMAL HIGH (ref 0–99)
NonHDL: 165.21
Total CHOL/HDL Ratio: 3
Triglycerides: 67 mg/dL (ref 0.0–149.0)
VLDL: 13.4 mg/dL (ref 0.0–40.0)

## 2020-12-28 LAB — HEMOGLOBIN A1C: Hgb A1c MFr Bld: 6.1 % (ref 4.6–6.5)

## 2020-12-28 LAB — CBC WITH DIFFERENTIAL/PLATELET
Basophils Absolute: 0 10*3/uL (ref 0.0–0.1)
Basophils Relative: 1.1 % (ref 0.0–3.0)
Eosinophils Absolute: 0.1 10*3/uL (ref 0.0–0.7)
Eosinophils Relative: 3.5 % (ref 0.0–5.0)
HCT: 37.2 % (ref 36.0–46.0)
Hemoglobin: 12.4 g/dL (ref 12.0–15.0)
Lymphocytes Relative: 27.3 % (ref 12.0–46.0)
Lymphs Abs: 0.9 10*3/uL (ref 0.7–4.0)
MCHC: 33.2 g/dL (ref 30.0–36.0)
MCV: 91.3 fl (ref 78.0–100.0)
Monocytes Absolute: 0.2 10*3/uL (ref 0.1–1.0)
Monocytes Relative: 6.7 % (ref 3.0–12.0)
Neutro Abs: 2.1 10*3/uL (ref 1.4–7.7)
Neutrophils Relative %: 61.4 % (ref 43.0–77.0)
Platelets: 300 10*3/uL (ref 150.0–400.0)
RBC: 4.07 Mil/uL (ref 3.87–5.11)
RDW: 13.6 % (ref 11.5–15.5)
WBC: 3.4 10*3/uL — ABNORMAL LOW (ref 4.0–10.5)

## 2020-12-28 LAB — VITAMIN D 25 HYDROXY (VIT D DEFICIENCY, FRACTURES): VITD: 29.89 ng/mL — ABNORMAL LOW (ref 30.00–100.00)

## 2020-12-28 LAB — CORTISOL: Cortisol, Plasma: 13.8 ug/dL

## 2020-12-28 MED ORDER — DOXYCYCLINE HYCLATE 100 MG PO TABS: 100.0000 mg | ORAL_TABLET | Freq: Two times a day (BID) | ORAL | 0 refills | Status: DC

## 2020-12-28 MED ORDER — LEVOTHYROXINE SODIUM 100 MCG PO TABS: 100.0000 ug | ORAL_TABLET | Freq: Every day | ORAL | 1 refills | Status: DC

## 2020-12-28 MED ORDER — KETOCONAZOLE 2 % EX CREA: 1.0000 "application " | TOPICAL_CREAM | Freq: Every day | CUTANEOUS | 0 refills | Status: DC

## 2020-12-28 MED ORDER — TRIAMCINOLONE ACETONIDE 0.1 % EX CREA: 1.0000 "application " | TOPICAL_CREAM | Freq: Two times a day (BID) | CUTANEOUS | 0 refills | Status: DC

## 2020-12-28 NOTE — Patient Instructions (Addendum)
I recommend Dr.  Janyce Llanos with [facility]   For foot and ankle issues  stick with [facility] for dermatology   I'll check with Dr Nicki Reaper about dentistry.    Check with your insurance about coverage for DEXA (bone density) prior to age 59

## 2020-12-28 NOTE — Progress Notes (Signed)
Patient ID: Rebecca Cole, female    DOB: 1961/04/10  Age: 60 y.o. MRN: [id number]  The patient is here for follow up and  management of other chronic and acute problems.   The risk factors are reflected in the social history.  The roster of all physicians providing medical care to patient - is listed in the Snapshot section of the chart.  Activities of daily living:  The patient is 100% independent in all ADLs: dressing, toileting, feeding as well as independent mobility  Home safety : The patient has smoke detectors in the home. They wear seatbelts.  There are no firearms at home. There is no violence in the home.   There is no risks for hepatitis, STDs or HIV. There is no   history of blood transfusion. They have no travel history to infectious disease endemic areas of the world.  The patient has seen their dentist in the last six month. They have seen their eye doctor in the last year. She denies  hearing difficulty with regard to whispered voices and some television programs.  They have deferred audiologic testing in the last year.  They do not  have excessive sun exposure. Discussed the need for sun protection: hats, long sleeves and use of sunscreen if there is significant sun exposure.   Diet: the importance of a healthy diet is discussed. She has a  healthy diet.  The benefits of regular aerobic exercise were discussed. She is extremely physically very active 6 to 7 days per week .  Depression screen: there are no signs or vegative symptoms of depression- irritability, change in appetite, anhedonia, sadness/tearfullness.   The following portions of the patient's history were reviewed and updated as appropriate: allergies, current medications, past family history, past medical history,  past surgical history, past social history  and problem list.  Visual acuity was not assessed per patient preference since she has regular follow up with her ophthalmologist. Hearing and body mass  index were assessed and reviewed.   During the course of the visit the patient was educated and counseled about appropriate screening and preventive services including : fall prevention , diabetes screening, nutrition counseling, colorectal cancer screening, and recommended immunizations.    CC: The primary encounter diagnosis was Routine general medical examination at a health care facility. Diagnoses of Neck pain, bilateral posterior, Impaired fasting glucose, Vitamin D deficiency, Hyperlipidemia LDL goal <160, COVID-19 vaccine series completed, Buffalo hump, Fatigue, unspecified type, Cervical radiculopathy at C5, Hypothyroidism, acquired, autoimmune, and Pes planus of both feet were also pertinent to this visit.  HM:  Sees Dr. Dellis Filbert  Next week for gyn and.breast exam   Bilateral acquired foot deformities resulting in pronation, which limits her hiking .  She has been using  orthotics which are nearly 59 yrs old, needs to see ortho or podiatry     [name] has a past medical history of Graves Disease (Sept 2010) and Graves' orbitopathy (2010).   She has a past surgical history that includes Breast biopsy (2006) and Breast cyst excision (Right, 2006).   Her family history includes Breast cancer in her maternal grandmother; Cancer in her father and maternal grandmother; Heart disease in her father.She reports that she has never smoked. She has never used smokeless tobacco. She reports current alcohol use. She reports that she does not use drugs.  Outpatient Medications Prior to Visit  Medication Sig Dispense Refill  . Cholecalciferol (VITAMIN D3) 1000 UNITS CAPS Take 1 capsule by mouth daily.    Marland Kitchen  EPINEPHrine 0.3 mg/0.3 mL IJ SOAJ injection Inject 0.3 mLs (0.3 mg total) into the muscle as needed for anaphylaxis. 2 each 1  . fluticasone (FLONASE) 50 MCG/ACT nasal spray Place 2 sprays into both nostrils daily. 16 g 2  . ketoconazole (NIZORAL) 2 % cream APPLY 1 APPLICATION TOPICALLY DAILY.  15 g 0  . levothyroxine (SYNTHROID) 88 MCG tablet Take 1 tablet (88 mcg total) by mouth daily before breakfast. 90 tablet 0  . diphenhydrAMINE (BENADRYL) 25 MG tablet Take 2 tablets (50 mg total) by mouth every 6 (six) hours as needed. (Patient not taking: Reported on 12/28/2020) 30 tablet 0  . predniSONE (DELTASONE) 50 MG tablet 1 tablet as soon as possible after insect sting. Continue daily for 5 days (Patient not taking: Reported on 12/28/2020) 5 tablet 2  . doxycycline (VIBRA-TABS) 100 MG tablet Take 1 tablet (100 mg total) by mouth 2 (two) times daily. (Patient not taking: Reported on 12/28/2020) 20 tablet 0   No facility-administered medications prior to visit.    Review of Systems  Patient denies headache, fevers, malaise, unintentional weight loss, skin rash, eye pain, sinus congestion and sinus pain, sore throat, dysphagia,  hemoptysis , cough, dyspnea, wheezing, chest pain, palpitations, orthopnea, edema, abdominal pain, nausea, melena, diarrhea, constipation, flank pain, dysuria, hematuria, urinary  Frequency, nocturia, numbness, tingling, seizures,  Focal weakness, Loss of consciousness,  Tremor, insomnia, depression, anxiety, and suicidal ideation.     Objective:  BP 116/88 (BP Location: Left Arm, Patient Position: Sitting, Cuff Size: Normal)   Pulse (!) 58   Temp (!) 96.9 F (36.1 C) (Temporal)   Resp 13   Ht 5\' 8"  (1.727 m)   Wt 139 lb 3.2 oz (63.1 kg)   SpO2 98%   BMI 21.17 kg/m   Physical Exam   General appearance: alert, cooperative and appears stated age Ears: normal TM's and external ear canals both ears Throat: lips, mucosa, and tongue normal; teeth and gums normal Neck: no adenopathy, no carotid bruit, supple, symmetrical, trachea midline and thyroid not enlarged, symmetric, no tenderness/mass/nodules Back: symmetric, no curvature. ROM normal. No CVA tenderness. Lungs: clear to auscultation bilaterally Heart: regular rate and rhythm, S1, S2 normal, no murmur,  click, rub or gallop Abdomen: soft, non-tender; bowel sounds normal; no masses,  no organomegaly Pulses: 2+ and symmetric Skin: Skin color, texture, turgor normal. No rashes or lesions Lymph nodes: Cervical, supraclavicular, and axillary nodes normal.  Assessment & Plan:   Problem List Items Addressed This Visit      Unprioritized   Cervical radiculopathy at C5    She has developed a slight stoop of her shoulders which has led to the appearance of a small hump.  Plain films were repeated today and no soft tissue swelling was noted .       Hypothyroidism, acquired, autoimmune    Her thyroid is currently managed with a weekly dose of 600 mcg weekly using levothyroxine   Lab Results  Component Value Date   TSH 0.64 12/08/2020         Relevant Medications   levothyroxine (SYNTHROID) 100 MCG tablet   Impaired fasting glucose    Fasting sugars have been > 100 but < 110.   A1c suggests she is at risk for diabetes  Lab Results  Component Value Date   HGBA1C 6.1 12/28/2020         Relevant Orders   Comprehensive metabolic panel (Completed)   Hemoglobin A1c (Completed)   Pes planus  Her orthotics are in need of replacement and she is requesting referral to specialist.  Recommending Dr Janyce Llanos at [place]       Routine general medical examination at a health care facility - Primary    age appropriate education and counseling updated, referrals for preventative services and immunizations addressed, dietary and smoking counseling addressed, most recent labs reviewed.  I have personally reviewed and have noted:  1) the patient's medical and social history 2) The pt's use of alcohol, tobacco, and illicit drugs 3) The patient's current medications and supplements 4) Functional ability including ADL's, fall risk, home safety risk, hearing and visual impairment 5) Diet and physical activities 6) Evidence for depression or mood disorder 7) The patient's height, weight,  and BMI have been recorded in the chart  I have made referrals, and provided counseling and education based on review of the above      Vitamin D deficiency   Relevant Orders   VITAMIN D 25 Hydroxy (Vit-D Deficiency, Fractures) (Completed)    Other Visit Diagnoses    Neck pain, bilateral posterior       Relevant Orders   DG Cervical Spine Complete (Completed)   Hyperlipidemia LDL goal <160       Relevant Orders   Lipid panel (Completed)   COVID-19 vaccine series completed       Relevant Orders   SARS-CoV-2 Semi-Quantitative Total Antibody, Spike   [place] hump       Relevant Orders   Cortisol (Completed)   Fatigue, unspecified type       Relevant Orders   CBC with Differential/Platelet (Completed)      I have changed Rodman Pickle. Varney's levothyroxine. I am also having her start on triamcinolone cream. Additionally, I am having her maintain her Vitamin D3, fluticasone, diphenhydrAMINE, predniSONE, EPINEPHrine, ketoconazole, and doxycycline.  Meds ordered this encounter  Medications  . ketoconazole (NIZORAL) 2 % cream    Sig: Apply 1 application topically daily.    Dispense:  15 g    Refill:  0  . doxycycline (VIBRA-TABS) 100 MG tablet    Sig: Take 1 tablet (100 mg total) by mouth 2 (two) times daily.    Dispense:  20 tablet    Refill:  0  . levothyroxine (SYNTHROID) 100 MCG tablet    Sig: Take 1 tablet (100 mcg total) by mouth daily before breakfast.    Dispense:  90 tablet    Refill:  1  . triamcinolone cream (KENALOG) 0.1 %    Sig: Apply 1 application topically 2 (two) times daily. As needed for rash    Dispense:  80 g    Refill:  0    Medications Discontinued During This Encounter  Medication Reason  . ketoconazole (NIZORAL) 2 % cream Reorder  . doxycycline (VIBRA-TABS) 100 MG tablet Reorder  . levothyroxine (SYNTHROID) 88 MCG tablet     Follow-up: No follow-ups on file.   [name], MD

## 2020-12-29 NOTE — Assessment & Plan Note (Signed)
Her orthotics are in need of replacement and she is requesting referral to specialist.  Recommending Dr Janyce Llanos at [place]

## 2020-12-29 NOTE — Assessment & Plan Note (Signed)
She has developed a slight stoop of her shoulders which has led to the appearance of a small hump.  Plain films were repeated today and no soft tissue swelling was noted .

## 2020-12-29 NOTE — Assessment & Plan Note (Signed)
Her thyroid is currently managed with a weekly dose of 600 mcg weekly using levothyroxine   Lab Results  Component Value Date   TSH 0.64 12/08/2020

## 2020-12-29 NOTE — Assessment & Plan Note (Signed)
Fasting sugars have been > 100 but < 110.   A1c suggests she is at risk for diabetes  Lab Results  Component Value Date   HGBA1C 6.1 12/28/2020

## 2020-12-29 NOTE — Assessment & Plan Note (Signed)

## 2021-01-01 LAB — SARS-COV-2 SEMI-QUANTITATIVE TOTAL ANTIBODY, SPIKE: SARS COV2 AB, Total Spike Semi QN: >2500 U/mL — ABNORMAL HIGH (ref <0.8)

## 2021-01-03 ENCOUNTER — Other Ambulatory Visit: Payer: Self-pay

## 2021-01-03 ENCOUNTER — Encounter: Payer: Self-pay | Admitting: Obstetrics & Gynecology

## 2021-01-03 ENCOUNTER — Ambulatory Visit (INDEPENDENT_AMBULATORY_CARE_PROVIDER_SITE_OTHER): Payer: Federal, State, Local not specified - PPO | Admitting: Obstetrics & Gynecology

## 2021-01-03 VITALS — BP 132/80 | Ht 67.5 in | Wt 136.0 lb

## 2021-01-03 DIAGNOSIS — N952 Postmenopausal atrophic vaginitis: Secondary | ICD-10-CM

## 2021-01-03 DIAGNOSIS — Z780 Asymptomatic menopausal state: Secondary | ICD-10-CM | POA: Diagnosis not present

## 2021-01-03 DIAGNOSIS — F4323 Adjustment disorder with mixed anxiety and depressed mood: Secondary | ICD-10-CM | POA: Diagnosis not present

## 2021-01-03 DIAGNOSIS — Z01419 Encounter for gynecological examination (general) (routine) without abnormal findings: Secondary | ICD-10-CM | POA: Diagnosis not present

## 2021-01-03 MED ORDER — ESTRADIOL 10 MCG VA TABS: 1.0000 | ORAL_TABLET | VAGINAL | 4 refills | Status: DC

## 2021-01-03 NOTE — Progress Notes (Signed)
Rebecca Cole 09/05/1960 [id number]   History:    60 y.o. G0 Single.  Retired.  QI:[id number] presenting for annual gyn exam   RJJ:[id number], well on no HRT. No PMB.  Mild hot flushes and night sweats, tolerable.  No pelvic pain. Currently abstinent, no IC x last pap 10/2019 neg/HPV HR neg. Urine and bowel movements normal. Breasts normal. Health labs done with Fam MD. Hypothyroidism.  BMI 20.99.  Good fitness, working in the yard and healthy nutrition.  Past medical history,surgical history, family history and social history were all reviewed and documented in the EPIC chart.  Gynecologic History No LMP recorded. Patient is perimenopausal.  Obstetric History OB History  Gravida Para Term Preterm AB Living  0 0 0 0 0 0  SAB IAB Ectopic Multiple Live Births  0 0 0 0 0     ROS: A ROS was performed and pertinent positives and negatives are included in the history.  GENERAL: No fevers or chills. HEENT: No change in vision, no earache, sore throat or sinus congestion. NECK: No pain or stiffness. CARDIOVASCULAR: No chest pain or pressure. No palpitations. PULMONARY: No shortness of breath, cough or wheeze. GASTROINTESTINAL: No abdominal pain, nausea, vomiting or diarrhea, melena or bright red blood per rectum. GENITOURINARY: No urinary frequency, urgency, hesitancy or dysuria. MUSCULOSKELETAL: No joint or muscle pain, no back pain, no recent trauma. DERMATOLOGIC: No rash, no itching, no lesions. ENDOCRINE: No polyuria, polydipsia, no heat or cold intolerance. No recent change in weight. HEMATOLOGICAL: No anemia or easy bruising or bleeding. NEUROLOGIC: No headache, seizures, numbness, tingling or weakness. PSYCHIATRIC: No depression, no loss of interest in normal activity or change in sleep pattern.     Exam:   BP 132/80   Ht 5' 7.5" (1.715 m)   Wt 136 lb (61.7 kg)   BMI 20.99 kg/m   Body mass index is 20.99 kg/m.  General appearance : Well developed  well nourished female. No acute distress HEENT: Eyes: no retinal hemorrhage or exudates,  Neck supple, trachea midline, no carotid bruits, no thyroidmegaly Lungs: Clear to auscultation, no rhonchi or wheezes, or rib retractions  Heart: Regular rate and rhythm, no murmurs or gallops Breast:Examined in sitting and supine position were symmetrical in appearance, no palpable masses or tenderness,  no skin retraction, no nipple inversion, no nipple discharge, no skin discoloration, no axillary or supraclavicular lymphadenopathy Abdomen: no palpable masses or tenderness, no rebound or guarding Extremities: no edema or skin discoloration or tenderness  Pelvic: Vulva: Normal             Vagina: No gross lesions or discharge  Cervix: No gross lesions or discharge  Uterus  AV, normal size, shape and consistency, non-tender and mobile  Adnexa  Without masses or tenderness  Anus: Normal   Assessment/Plan:  60 y.o. female for annual exam   1. Well female exam with routine gynecological exam Normal gynecologic exam in menopause.  Pap test in March 2021 was negative with negative high-risk HPV, will repeat a Pap test next year.  Breast exam normal.  Screening mammogram June 2021 was negative.  Colonoscopy 2019.  Health labs with family physician.  Good body mass index at 20.99.  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  2. Postmenopause Well on no hormone replacement therapy.  No postmenopausal bleeding.  3. Postmenopausal atrophic vaginitis Complains of vaginal dryness.  Counseling done on postmenopausal atrophic vaginitis.  No contraindication to local estradiol treatment.  Decision to start on estradiol  tablet vaginally twice a week.  Prescription sent to pharmacy.  Other orders - Estradiol 10 MCG TABS vaginal tablet; Place 1 tablet (10 mcg total) vaginally 2 (two) times a week.  Princess Bruins MD, 11:36 AM 01/03/2021

## 2021-01-06 ENCOUNTER — Telehealth: Payer: Self-pay | Admitting: Internal Medicine

## 2021-01-06 DIAGNOSIS — Z1211 Encounter for screening for malignant neoplasm of colon: Secondary | ICD-10-CM

## 2021-01-06 NOTE — Telephone Encounter (Signed)
Colonoscopy report from 2018 received   5 yr follow up advised

## 2021-01-16 ENCOUNTER — Other Ambulatory Visit: Payer: Self-pay | Admitting: Internal Medicine

## 2021-01-16 DIAGNOSIS — Z1231 Encounter for screening mammogram for malignant neoplasm of breast: Secondary | ICD-10-CM

## 2021-02-14 DIAGNOSIS — F4323 Adjustment disorder with mixed anxiety and depressed mood: Secondary | ICD-10-CM | POA: Diagnosis not present

## 2021-02-15 DIAGNOSIS — H35372 Puckering of macula, left eye: Secondary | ICD-10-CM | POA: Diagnosis not present

## 2021-02-15 DIAGNOSIS — H52203 Unspecified astigmatism, bilateral: Secondary | ICD-10-CM | POA: Diagnosis not present

## 2021-02-15 DIAGNOSIS — H524 Presbyopia: Secondary | ICD-10-CM | POA: Diagnosis not present

## 2021-02-15 DIAGNOSIS — H43813 Vitreous degeneration, bilateral: Secondary | ICD-10-CM | POA: Diagnosis not present

## 2021-02-15 DIAGNOSIS — H5213 Myopia, bilateral: Secondary | ICD-10-CM | POA: Diagnosis not present

## 2021-03-08 ENCOUNTER — Telehealth: Payer: Self-pay

## 2021-03-08 DIAGNOSIS — E063 Autoimmune thyroiditis: Secondary | ICD-10-CM

## 2021-03-08 NOTE — Telephone Encounter (Signed)
Pt states that she needs a TSH lab ordered and can come in today or tomorrow. Please advise

## 2021-03-08 NOTE — Telephone Encounter (Signed)
Lab ordered please call patient re appt for labs

## 2021-03-09 ENCOUNTER — Other Ambulatory Visit: Payer: Self-pay

## 2021-03-09 ENCOUNTER — Other Ambulatory Visit (INDEPENDENT_AMBULATORY_CARE_PROVIDER_SITE_OTHER): Payer: Federal, State, Local not specified - PPO

## 2021-03-09 DIAGNOSIS — E063 Autoimmune thyroiditis: Secondary | ICD-10-CM | POA: Diagnosis not present

## 2021-03-10 LAB — THYROID PANEL WITH TSH
Free Thyroxine Index: 3.2 (ref 1.4–3.8)
T3 Uptake: 35 % (ref 22–35)
T4, Total: 9 ug/dL (ref 5.1–11.9)
TSH: 1.06 mIU/L (ref 0.40–4.50)

## 2021-03-13 ENCOUNTER — Other Ambulatory Visit: Payer: Self-pay

## 2021-03-13 ENCOUNTER — Ambulatory Visit
Admission: RE | Admit: 2021-03-13 | Discharge: 2021-03-13 | Disposition: A | Discharge status: Home / Self Care | Payer: Federal, State, Local not specified - PPO | Source: Ambulatory Visit | Attending: Internal Medicine | Admitting: Internal Medicine

## 2021-03-13 DIAGNOSIS — Z1231 Encounter for screening mammogram for malignant neoplasm of breast: Secondary | ICD-10-CM | POA: Diagnosis not present

## 2021-03-28 DIAGNOSIS — F4323 Adjustment disorder with mixed anxiety and depressed mood: Secondary | ICD-10-CM | POA: Diagnosis not present

## 2021-04-11 DIAGNOSIS — F4323 Adjustment disorder with mixed anxiety and depressed mood: Secondary | ICD-10-CM | POA: Diagnosis not present

## 2021-05-08 ENCOUNTER — Other Ambulatory Visit: Payer: Self-pay | Admitting: Internal Medicine

## 2021-05-16 DIAGNOSIS — F4323 Adjustment disorder with mixed anxiety and depressed mood: Secondary | ICD-10-CM | POA: Diagnosis not present

## 2021-05-17 ENCOUNTER — Other Ambulatory Visit: Payer: Self-pay | Admitting: Internal Medicine

## 2021-05-17 DIAGNOSIS — E0500 Thyrotoxicosis with diffuse goiter without thyrotoxic crisis or storm: Secondary | ICD-10-CM | POA: Diagnosis not present

## 2021-05-22 NOTE — Telephone Encounter (Signed)
Patient called in to schedule an appointment per the message below.She stated that she needs to hear from [facility] within the next few days because she has concerns about taking prednisone.No openings until 11/16 with Dr.Tullo,please advise.

## 2021-05-30 ENCOUNTER — Other Ambulatory Visit: Payer: Self-pay | Admitting: Internal Medicine

## 2021-06-26 ENCOUNTER — Ambulatory Visit: Payer: Federal, State, Local not specified - PPO | Admitting: Internal Medicine

## 2021-06-26 ENCOUNTER — Other Ambulatory Visit: Payer: Self-pay

## 2021-06-26 ENCOUNTER — Encounter: Payer: Self-pay | Admitting: Internal Medicine

## 2021-06-26 VITALS — BP 116/74 | HR 60 | Temp 96.6°F | Ht 67.5 in | Wt 131.8 lb

## 2021-06-26 DIAGNOSIS — H538 Other visual disturbances: Secondary | ICD-10-CM | POA: Diagnosis not present

## 2021-06-26 DIAGNOSIS — E063 Autoimmune thyroiditis: Secondary | ICD-10-CM

## 2021-06-26 DIAGNOSIS — E0500 Thyrotoxicosis with diffuse goiter without thyrotoxic crisis or storm: Secondary | ICD-10-CM | POA: Diagnosis not present

## 2021-06-26 DIAGNOSIS — R7301 Impaired fasting glucose: Secondary | ICD-10-CM | POA: Diagnosis not present

## 2021-06-26 NOTE — Assessment & Plan Note (Signed)
Fasting sugars have been > 100 but < 110.   A1c suggests she is at risk for diabetes;  She has lost 9 lbs by restricting starches   Lab Results  Component Value Date   HGBA1C 6.1 12/28/2020

## 2021-06-26 NOTE — Assessment & Plan Note (Signed)
Awaiting insurance authorization of Teppezza .  Discussed previous success with diclofenac 50 mbg bid

## 2021-06-26 NOTE — Assessment & Plan Note (Signed)
Her thyroid is currently managed with a weekly dose of 600 mcg weekly using levothyroxine   Lab Results  Component Value Date   TSH 1.06 03/09/2021

## 2021-06-26 NOTE — Progress Notes (Signed)
Subjective:  Patient ID: Rebecca Cole, female    DOB: 11/17/1960  Age: 60 y.o. MRN: [id number]  CC: The primary encounter diagnosis was Hypothyroidism, acquired, autoimmune. Diagnoses of Impaired fasting glucose and Graves' orbitopathy were also pertinent to this visit.  HPI Valincia Touch presents for follow up on elevated glucose levels , thyroid eye disease, prediabetes Chief Complaint  Patient presents with   Follow-up   This visit occurred during the SARS-CoV-2 public health emergency.  Safety protocols were in place, including screening questions prior to the visit, additional usage of staff PPE, and extensive cleaning of exam room while observing appropriate contact time as indicated for disinfecting solutions.   60 yr old female with history of Graves' Disease recently placed on prednisone for suppression of change due to thyroid eye disease. She was prescribed prednisone 20 mg daily  for 30 days,  then tapered it  for 10 days,   last dose was today ; she notes that  orbital disease has started to progress again .  She is awaiting approval of  Tepezza  by insurance an is concerned about the lack of alternative therapies should  insurance denies authoriziation .  Reviewed and Discussed previous trial of diclofenac  50 mg bid in 2013 by Dr Donavan Burnet   Prediabetes:  she lost 9 lbs unintentionally by giving up starches    Outpatient Medications Prior to Visit  Medication Sig Dispense Refill   Cholecalciferol (VITAMIN D3) 1000 UNITS CAPS Take 1 capsule by mouth daily.     EPINEPHRINE 0.3 mg/0.3 mL IJ SOAJ injection INJECT 0.3 MLS (0.3 MG TOTAL) INTO THE MUSCLE AS NEEDED FOR ANAPHYLAXIS. 2 each 1   fluticasone (FLONASE) 50 MCG/ACT nasal spray Place 2 sprays into both nostrils daily. 16 g 2   levothyroxine (SYNTHROID) 100 MCG tablet TAKE 1 TABLET BY MOUTH DAILY BEFORE BREAKFAST. (Patient taking differently: Take 100 mcg by mouth daily before breakfast. For 6 days a week, skip one day)  90 tablet 1   Estradiol 10 MCG TABS vaginal tablet Place 1 tablet (10 mcg total) vaginally 2 (two) times a week. (Patient not taking: Reported on 06/26/2021) 24 tablet 4   ketoconazole (NIZORAL) 2 % cream Apply 1 application topically daily. (Patient not taking: Reported on 06/26/2021) 15 g 0   triamcinolone cream (KENALOG) 0.1 % APPLY 1 APPLICATION TOPICALLY 2 (TWO) TIMES DAILY. AS NEEDED FOR RASH (Patient not taking: Reported on 06/26/2021) 80 g 0   No facility-administered medications prior to visit.    Review of Systems;  Patient denies headache, fevers, malaise, unintentional weight loss, skin rash, eye pain, sinus congestion and sinus pain, sore throat, dysphagia,  hemoptysis , cough, dyspnea, wheezing, chest pain, palpitations, orthopnea, edema, abdominal pain, nausea, melena, diarrhea, constipation, flank pain, dysuria, hematuria, urinary  Frequency, nocturia, numbness, tingling, seizures,  Focal weakness, Loss of consciousness,  Tremor, insomnia, depression, anxiety, and suicidal ideation.      Objective:  BP 116/74 (BP Location: Left Arm, Patient Position: Sitting, Cuff Size: Normal)   Pulse 60   Temp (!) 96.6 F (35.9 C) (Temporal)   Ht 5' 7.5" (1.715 m)   Wt 131 lb 12.8 oz (59.8 kg)   SpO2 99%   BMI 20.34 kg/m   BP Readings from Last 3 Encounters:  06/26/21 116/74  01/03/21 132/80  12/28/20 116/88    Wt Readings from Last 3 Encounters:  06/26/21 131 lb 12.8 oz (59.8 kg)  01/03/21 136 lb (61.7 kg)  12/28/20 139 lb  3.2 oz (63.1 kg)    General appearance: alert, cooperative and appears stated age Ears: normal TM's and external ear canals both ears Throat: lips, mucosa, and tongue normal; teeth and gums normal Neck: no adenopathy, no carotid bruit, supple, symmetrical, trachea midline and thyroid not enlarged, symmetric, no tenderness/mass/nodules Back: symmetric, no curvature. ROM normal. No CVA tenderness. Lungs: clear to auscultation bilaterally Heart: regular  rate and rhythm, S1, S2 normal, no murmur, click, rub or gallop Abdomen: soft, non-tender; bowel sounds normal; no masses,  no organomegaly Pulses: 2+ and symmetric Skin: Skin color, texture, turgor normal. No rashes or lesions Lymph nodes: Cervical, supraclavicular, and axillary nodes normal.  Lab Results  Component Value Date   HGBA1C 6.1 12/28/2020   HGBA1C 5.8 03/06/2018    Lab Results  Component Value Date   CREATININE 0.73 12/28/2020   CREATININE 0.79 11/02/2019   CREATININE 0.82 11/28/2017    Lab Results  Component Value Date   WBC 3.4 (L) 12/28/2020   HGB 12.4 12/28/2020   HCT 37.2 12/28/2020   PLT 300.0 12/28/2020   GLUCOSE 96 12/28/2020   CHOL 234 (H) 12/28/2020   TRIG 67.0 12/28/2020   HDL 68.90 12/28/2020   LDLDIRECT 132.0 01/11/2015   LDLCALC 152 (H) 12/28/2020   ALT 19 12/28/2020   AST 27 12/28/2020   NA 140 12/28/2020   K 4.0 12/28/2020   CL 104 12/28/2020   CREATININE 0.73 12/28/2020   BUN 12 12/28/2020   CO2 30 12/28/2020   TSH 1.06 03/09/2021   HGBA1C 6.1 12/28/2020    MM 3D SCREEN BREAST BILATERAL  Result Date: 03/15/2021 CLINICAL DATA:  Screening. EXAM: DIGITAL SCREENING BILATERAL MAMMOGRAM WITH TOMOSYNTHESIS AND CAD TECHNIQUE: Bilateral screening digital craniocaudal and mediolateral oblique mammograms were obtained. Bilateral screening digital breast tomosynthesis was performed. The images were evaluated with computer-aided detection. COMPARISON:  Previous exam(s). ACR Breast Density Category c: The breast tissue is heterogeneously dense, which may obscure small masses. FINDINGS: There are no findings suspicious for malignancy. IMPRESSION: No mammographic evidence of malignancy. A result letter of this screening mammogram will be mailed directly to the patient. RECOMMENDATION: Screening mammogram in one year. (Code:SM-B-01Y) BI-RADS CATEGORY  1: Negative. Electronically Signed   By: Lajean Manes M.D.   On: 03/15/2021 11:28    Assessment & Plan:    Problem List Items Addressed This Visit     Graves' orbitopathy    Awaiting insurance authorization of Teppezza .  Discussed previous success with diclofenac 50 mbg bid       Hypothyroidism, acquired, autoimmune - Primary    Her thyroid is currently managed with a weekly dose of 600 mcg weekly using levothyroxine   Lab Results  Component Value Date   TSH 1.06 03/09/2021         Relevant Orders   Thyroid Panel With TSH   Impaired fasting glucose    Fasting sugars have been > 100 but < 110.   A1c suggests she is at risk for diabetes;  She has lost 9 lbs by restricting starches   Lab Results  Component Value Date   HGBA1C 6.1 12/28/2020         Relevant Orders   Hemoglobin A1c   Comprehensive metabolic panel    I am having Lindwood Qua maintain her Vitamin D3, fluticasone, ketoconazole, Estradiol, triamcinolone cream, levothyroxine, and EPINEPHrine.  No orders of the defined types were placed in this encounter.   There are no discontinued medications.  Follow-up: No follow-ups on  file.   [name], MD

## 2021-06-27 LAB — COMPREHENSIVE METABOLIC PANEL
ALT: 18 U/L (ref 0–35)
AST: 20 U/L (ref 0–37)
Albumin: 4.6 g/dL (ref 3.5–5.2)
Alkaline Phosphatase: 64 U/L (ref 39–117)
BUN: 10 mg/dL (ref 6–23)
CO2: 28 mEq/L (ref 19–32)
Calcium: 9 mg/dL (ref 8.4–10.5)
Chloride: 102 mEq/L (ref 96–112)
Creatinine, Ser: 0.85 mg/dL (ref 0.40–1.20)
GFR: 75.22 mL/min (ref >60.00)
Glucose, Bld: 108 mg/dL — ABNORMAL HIGH (ref 70–99)
Potassium: 3.9 mEq/L (ref 3.5–5.1)
Sodium: 138 mEq/L (ref 135–145)
Total Bilirubin: 0.4 mg/dL (ref 0.2–1.2)
Total Protein: 6.8 g/dL (ref 6.0–8.3)

## 2021-06-27 LAB — HEMOGLOBIN A1C: Hgb A1c MFr Bld: 6.2 % (ref 4.6–6.5)

## 2021-06-28 LAB — THYROID PANEL WITH TSH
Free Thyroxine Index: 3.4 (ref 1.4–3.8)
T3 Uptake: 38 % — ABNORMAL HIGH (ref 22–35)
T4, Total: 8.9 ug/dL (ref 5.1–11.9)
TSH: 1.6 mIU/L (ref 0.40–4.50)

## 2021-07-04 DIAGNOSIS — F4323 Adjustment disorder with mixed anxiety and depressed mood: Secondary | ICD-10-CM | POA: Diagnosis not present

## 2021-08-14 DIAGNOSIS — M9913 Subluxation complex (vertebral) of lumbar region: Secondary | ICD-10-CM | POA: Diagnosis not present

## 2021-08-14 DIAGNOSIS — M9912 Subluxation complex (vertebral) of thoracic region: Secondary | ICD-10-CM | POA: Diagnosis not present

## 2021-08-14 DIAGNOSIS — M9906 Segmental and somatic dysfunction of lower extremity: Secondary | ICD-10-CM | POA: Diagnosis not present

## 2021-08-14 DIAGNOSIS — M9911 Subluxation complex (vertebral) of cervical region: Secondary | ICD-10-CM | POA: Diagnosis not present

## 2021-08-25 ENCOUNTER — Encounter: Payer: Self-pay | Admitting: Internal Medicine

## 2021-08-27 ENCOUNTER — Other Ambulatory Visit: Payer: Self-pay | Admitting: Internal Medicine

## 2021-08-27 MED ORDER — DICLOFENAC SODIUM 50 MG PO TBEC: 50.0000 mg | DELAYED_RELEASE_TABLET | Freq: Two times a day (BID) | ORAL | 5 refills | Status: DC

## 2021-08-29 DIAGNOSIS — F4323 Adjustment disorder with mixed anxiety and depressed mood: Secondary | ICD-10-CM | POA: Diagnosis not present

## 2021-09-14 DIAGNOSIS — M9912 Subluxation complex (vertebral) of thoracic region: Secondary | ICD-10-CM | POA: Diagnosis not present

## 2021-09-14 DIAGNOSIS — M9911 Subluxation complex (vertebral) of cervical region: Secondary | ICD-10-CM | POA: Diagnosis not present

## 2021-09-14 DIAGNOSIS — M9913 Subluxation complex (vertebral) of lumbar region: Secondary | ICD-10-CM | POA: Diagnosis not present

## 2021-09-14 DIAGNOSIS — M9906 Segmental and somatic dysfunction of lower extremity: Secondary | ICD-10-CM | POA: Diagnosis not present

## 2021-09-27 DIAGNOSIS — M2141 Flat foot [pes planus] (acquired), right foot: Secondary | ICD-10-CM | POA: Diagnosis not present

## 2021-09-27 DIAGNOSIS — M79671 Pain in right foot: Secondary | ICD-10-CM | POA: Diagnosis not present

## 2021-09-27 DIAGNOSIS — Q666 Other congenital valgus deformities of feet: Secondary | ICD-10-CM | POA: Diagnosis not present

## 2021-09-27 DIAGNOSIS — M19071 Primary osteoarthritis, right ankle and foot: Secondary | ICD-10-CM | POA: Diagnosis not present

## 2021-09-27 DIAGNOSIS — M79672 Pain in left foot: Secondary | ICD-10-CM | POA: Diagnosis not present

## 2021-09-27 DIAGNOSIS — M2142 Flat foot [pes planus] (acquired), left foot: Secondary | ICD-10-CM | POA: Diagnosis not present

## 2021-10-05 DIAGNOSIS — H538 Other visual disturbances: Secondary | ICD-10-CM | POA: Diagnosis not present

## 2021-10-05 DIAGNOSIS — H6981 Other specified disorders of Eustachian tube, right ear: Secondary | ICD-10-CM | POA: Diagnosis not present

## 2021-10-05 DIAGNOSIS — H0589 Other disorders of orbit: Secondary | ICD-10-CM | POA: Diagnosis not present

## 2021-10-05 DIAGNOSIS — H903 Sensorineural hearing loss, bilateral: Secondary | ICD-10-CM | POA: Diagnosis not present

## 2021-10-05 DIAGNOSIS — E0500 Thyrotoxicosis with diffuse goiter without thyrotoxic crisis or storm: Secondary | ICD-10-CM | POA: Diagnosis not present

## 2021-10-13 DIAGNOSIS — E0500 Thyrotoxicosis with diffuse goiter without thyrotoxic crisis or storm: Secondary | ICD-10-CM | POA: Diagnosis not present

## 2021-10-16 DIAGNOSIS — L820 Inflamed seborrheic keratosis: Secondary | ICD-10-CM | POA: Diagnosis not present

## 2021-10-16 DIAGNOSIS — L821 Other seborrheic keratosis: Secondary | ICD-10-CM | POA: Diagnosis not present

## 2021-10-16 DIAGNOSIS — L814 Other melanin hyperpigmentation: Secondary | ICD-10-CM | POA: Diagnosis not present

## 2021-10-16 DIAGNOSIS — L700 Acne vulgaris: Secondary | ICD-10-CM | POA: Diagnosis not present

## 2021-10-19 DIAGNOSIS — M9912 Subluxation complex (vertebral) of thoracic region: Secondary | ICD-10-CM | POA: Diagnosis not present

## 2021-10-19 DIAGNOSIS — F4323 Adjustment disorder with mixed anxiety and depressed mood: Secondary | ICD-10-CM | POA: Diagnosis not present

## 2021-10-19 DIAGNOSIS — M9911 Subluxation complex (vertebral) of cervical region: Secondary | ICD-10-CM | POA: Diagnosis not present

## 2021-10-19 DIAGNOSIS — M9913 Subluxation complex (vertebral) of lumbar region: Secondary | ICD-10-CM | POA: Diagnosis not present

## 2021-10-19 DIAGNOSIS — M9906 Segmental and somatic dysfunction of lower extremity: Secondary | ICD-10-CM | POA: Diagnosis not present

## 2021-11-03 DIAGNOSIS — E0500 Thyrotoxicosis with diffuse goiter without thyrotoxic crisis or storm: Secondary | ICD-10-CM | POA: Diagnosis not present

## 2021-11-23 DIAGNOSIS — M9913 Subluxation complex (vertebral) of lumbar region: Secondary | ICD-10-CM | POA: Diagnosis not present

## 2021-11-23 DIAGNOSIS — M9906 Segmental and somatic dysfunction of lower extremity: Secondary | ICD-10-CM | POA: Diagnosis not present

## 2021-11-23 DIAGNOSIS — M9911 Subluxation complex (vertebral) of cervical region: Secondary | ICD-10-CM | POA: Diagnosis not present

## 2021-11-23 DIAGNOSIS — M9912 Subluxation complex (vertebral) of thoracic region: Secondary | ICD-10-CM | POA: Diagnosis not present

## 2021-11-24 DIAGNOSIS — E0500 Thyrotoxicosis with diffuse goiter without thyrotoxic crisis or storm: Secondary | ICD-10-CM | POA: Diagnosis not present

## 2021-11-28 DIAGNOSIS — H903 Sensorineural hearing loss, bilateral: Secondary | ICD-10-CM | POA: Diagnosis not present

## 2021-12-15 DIAGNOSIS — E0500 Thyrotoxicosis with diffuse goiter without thyrotoxic crisis or storm: Secondary | ICD-10-CM | POA: Diagnosis not present

## 2021-12-21 DIAGNOSIS — M9913 Subluxation complex (vertebral) of lumbar region: Secondary | ICD-10-CM | POA: Diagnosis not present

## 2021-12-21 DIAGNOSIS — M9911 Subluxation complex (vertebral) of cervical region: Secondary | ICD-10-CM | POA: Diagnosis not present

## 2021-12-21 DIAGNOSIS — M9906 Segmental and somatic dysfunction of lower extremity: Secondary | ICD-10-CM | POA: Diagnosis not present

## 2021-12-21 DIAGNOSIS — M9912 Subluxation complex (vertebral) of thoracic region: Secondary | ICD-10-CM | POA: Diagnosis not present

## 2021-12-22 DIAGNOSIS — H903 Sensorineural hearing loss, bilateral: Secondary | ICD-10-CM | POA: Diagnosis not present

## 2021-12-26 DIAGNOSIS — F4323 Adjustment disorder with mixed anxiety and depressed mood: Secondary | ICD-10-CM | POA: Diagnosis not present

## 2021-12-29 ENCOUNTER — Encounter: Payer: Federal, State, Local not specified - PPO | Admitting: Internal Medicine

## 2022-01-02 ENCOUNTER — Encounter: Payer: Self-pay | Admitting: Internal Medicine

## 2022-01-02 ENCOUNTER — Ambulatory Visit (INDEPENDENT_AMBULATORY_CARE_PROVIDER_SITE_OTHER): Payer: Federal, State, Local not specified - PPO | Admitting: Internal Medicine

## 2022-01-02 VITALS — BP 112/62 | HR 56 | Temp 98.1°F | Ht 67.5 in | Wt 131.7 lb

## 2022-01-02 DIAGNOSIS — Z800 Family history of malignant neoplasm of digestive organs: Secondary | ICD-10-CM

## 2022-01-02 DIAGNOSIS — R7301 Impaired fasting glucose: Secondary | ICD-10-CM | POA: Diagnosis not present

## 2022-01-02 DIAGNOSIS — Z0000 Encounter for general adult medical examination without abnormal findings: Secondary | ICD-10-CM

## 2022-01-02 DIAGNOSIS — E063 Autoimmune thyroiditis: Secondary | ICD-10-CM | POA: Diagnosis not present

## 2022-01-02 DIAGNOSIS — E0500 Thyrotoxicosis with diffuse goiter without thyrotoxic crisis or storm: Secondary | ICD-10-CM

## 2022-01-02 DIAGNOSIS — M2141 Flat foot [pes planus] (acquired), right foot: Secondary | ICD-10-CM | POA: Diagnosis not present

## 2022-01-02 DIAGNOSIS — M2142 Flat foot [pes planus] (acquired), left foot: Secondary | ICD-10-CM

## 2022-01-02 MED ORDER — DOXYCYCLINE HYCLATE 100 MG PO TABS: 100.0000 mg | ORAL_TABLET | Freq: Two times a day (BID) | ORAL | 0 refills | Status: DC

## 2022-01-02 MED ORDER — TRIAMCINOLONE ACETONIDE 0.1 % EX CREA: 1.0000 "application " | TOPICAL_CREAM | Freq: Two times a day (BID) | CUTANEOUS | 0 refills | Status: DC

## 2022-01-02 MED ORDER — KETOCONAZOLE 2 % EX CREA: 1.0000 "application " | TOPICAL_CREAM | Freq: Every day | CUTANEOUS | 0 refills | Status: DC

## 2022-01-02 NOTE — Assessment & Plan Note (Signed)
Now undergoing treatment with Tepezza  prescribed by [facility] ophthalmologist [facility] .  She is halfway through the 8 infusions and getting serial hearing test to monitor for hearing loss by Margaretha Sheffield

## 2022-01-02 NOTE — Assessment & Plan Note (Signed)

## 2022-01-02 NOTE — Assessment & Plan Note (Signed)
Continue cumulative levothyroxine dose t keep TSH between 1 and 2

## 2022-01-02 NOTE — Assessment & Plan Note (Addendum)
Managed with surgery,  For 25 years.  New orthotics prescribed by [place] at [facility] per Feb 2023 visit .  She has not had the orthotics visit yet.

## 2022-01-02 NOTE — Progress Notes (Signed)
Patient ID: Gennie Eisinger, female    DOB: 1960-10-22  Age: 61 y.o. MRN: [id number]  The patient is here for annual non gyn  wellness examination and management of other chronic and acute problems.   The risk factors are reflected in the social history.  The roster of all physicians providing medical care to patient - is listed in the Snapshot section of the chart.  Activities of daily living:  The patient is 100% independent in all ADLs: dressing, toileting, feeding as well as independent mobility  Home safety : The patient has smoke detectors in the home. They wear seatbelts.  There are no firearms at home. There is no violence in the home.   There is no risks for hepatitis, STDs or HIV. There is no   history of blood transfusion. They have no travel history to infectious disease endemic areas of the world.  The patient has seen their dentist in the last six month. They have seen their eye doctor in the last year. They admit to slight hearing difficulty with regard to whispered voices and some television programs.  They have deferred audiologic testing in the last year.  They do not  have excessive sun exposure. Discussed the need for sun protection: hats, long sleeves and use of sunscreen if there is significant sun exposure.   Diet: the importance of a healthy diet is discussed. They do have a healthy diet.  The benefits of regular aerobic exercise were discussed. She walks 4 times per week ,  20 minutes.   Depression screen: there are no signs or vegative symptoms of depression- irritability, change in appetite, anhedonia, sadness/tearfullness.  Cognitive assessment: the patient manages all their financial and personal affairs and is actively engaged. They could relate day,date,year and events; recalled 2/3 objects at 3 minutes; performed clock-face test normally.  The following portions of the patient's history were reviewed and updated as appropriate: allergies, current medications,  past family history, past medical history,  past surgical history, past social history  and problem list.  Visual acuity was not assessed per patient preference since she has regular follow up with her ophthalmologist. Hearing and body mass index were assessed and reviewed.   During the course of the visit the patient was educated and counseled about appropriate screening and preventive services including : fall prevention , diabetes screening, nutrition counseling, colorectal cancer screening, and recommended immunizations.    CC: The primary encounter diagnosis was Impaired fasting glucose. Diagnoses of Pes planus of both feet, Hypothyroidism, acquired, autoimmune, Family history of colon cancer requiring screening colonoscopy, Graves' orbitopathy, and Routine general medical examination at a health care facility were also pertinent to this visit.   1) Hypothyroidism:  managed with levothyroxine with narrow therapeutic range (TSH goal  1-2) last checked in November.  2) Graves orbitopathy:  she was seen by  [facility] and offered therapy with  Netty Starring but patient did her research and found strong evidence of irreversible hearing loss and despite multiple attempts to engage the provider in a detailed discussion  about her concerns , and even supplying her with the [place] study  the provider has been uninterested in discussing it with her.  She was initially seen by [facility] ophthalmology  Dr Tyson Dense.  In the interim she has self referred to Dr Margaretha Sheffield and has had a baseline hearing test and reassured that her baseline hearing is excellent (with decibels to spare). After a 3 + month delay, she has started [place],  has had 4 doses, with serial  She has had a 20 decibel hearing loss bilaterally after the  3rd dose  and a unilateral  5 decibel loss after dose 4 .  The plan is to complete 8 doses.   3) Bilateral pes planus:  she was referred to Dr Belva Bertin at [facility] for new orthotics; x rays were  done of her feet noting significant abnormalities ,  right > left.   History  Johnay has a past medical history of Graves Disease (Sept 2010) and Graves' orbitopathy (2010).   She has a past surgical history that includes Breast biopsy (2006) and Breast cyst excision (Right, 2006).   Her family history includes Breast cancer in her maternal grandmother; Cancer in her father and maternal grandmother; Heart disease in her father.She reports that she has never smoked. She has never used smokeless tobacco. She reports current alcohol use. She reports that she does not use drugs.  Outpatient Medications Prior to Visit  Medication Sig Dispense Refill   Cholecalciferol (VITAMIN D3) 1000 UNITS CAPS Take 1 capsule by mouth daily.     EPINEPHRINE 0.3 mg/0.3 mL IJ SOAJ injection INJECT 0.3 MLS (0.3 MG TOTAL) INTO THE MUSCLE AS NEEDED FOR ANAPHYLAXIS. 2 each 1   Estradiol 10 MCG TABS vaginal tablet Place 1 tablet (10 mcg total) vaginally 2 (two) times a week. 24 tablet 4   fluticasone (FLONASE) 50 MCG/ACT nasal spray Place 2 sprays into both nostrils daily. 16 g 2   levothyroxine (SYNTHROID) 100 MCG tablet TAKE 1 TABLET BY MOUTH DAILY BEFORE BREAKFAST. (Patient taking differently: Take 100 mcg by mouth daily before breakfast. For 6 days a week, skip one day) 90 tablet 1   ketoconazole (NIZORAL) 2 % cream Apply 1 application topically daily. 15 g 0   triamcinolone cream (KENALOG) 0.1 % APPLY 1 APPLICATION TOPICALLY 2 (TWO) TIMES DAILY. AS NEEDED FOR RASH 80 g 0   diclofenac (VOLTAREN) 50 MG EC tablet Take 1 tablet (50 mg total) by mouth 2 (two) times daily. 60 tablet 5   No facility-administered medications prior to visit.    Review of Systems  Patient denies headache, fevers, malaise, unintentional weight loss, skin rash, eye pain, sinus congestion and sinus pain, sore throat, dysphagia,  hemoptysis , cough, dyspnea, wheezing, chest pain, palpitations, orthopnea, edema, abdominal pain, nausea, melena,  diarrhea, constipation, flank pain, dysuria, hematuria, urinary  Frequency, nocturia, numbness, tingling, seizures,  Focal weakness, Loss of consciousness,  Tremor, insomnia, depression, anxiety, and suicidal ideation.     Objective:  BP 112/62 (BP Location: Left Arm, Patient Position: Sitting, Cuff Size: Normal)   Pulse (!) 56   Temp 98.1 F (36.7 C) (Oral)   Ht 5' 7.5" (1.715 m)   Wt 131 lb 11.2 oz (59.7 kg)   SpO2 98%   BMI 20.32 kg/m   Physical Exam  General appearance: alert, cooperative and appears stated age Ears: normal TM's and external ear canals both ears Throat: lips, mucosa, and tongue normal; teeth and gums normal Neck: no adenopathy, no carotid bruit, supple, symmetrical, trachea midline and thyroid not enlarged, symmetric, no tenderness/mass/nodules Back: symmetric, no curvature. ROM normal. No CVA tenderness. Lungs: clear to auscultation bilaterally Heart: regular rate and rhythm, S1, S2 normal, no murmur, click, rub or gallop Abdomen: soft, non-tender; bowel sounds normal; no masses,  no organomegaly Pulses: 2+ and symmetric Skin: Skin color, texture, turgor normal. No rashes or lesions Lymph nodes: Cervical, supraclavicular, and axillary nodes normal.   Assessment &  Plan:   Problem List Items Addressed This Visit     Graves' orbitopathy    Now undergoing treatment with Tepezza  prescribed by [facility] ophthalmologist [facility] .  She is halfway through the 8 infusions and getting serial hearing test to monitor for hearing loss by Margaretha Sheffield       Hypothyroidism, acquired, autoimmune    Continue cumulative levothyroxine dose t keep TSH between 1 and 2       Relevant Orders   TSH   CBC with Differential/Platelet   Impaired fasting glucose - Primary   Relevant Orders   Comprehensive metabolic panel   Hemoglobin A1c   Lipid Panel w/reflex Direct LDL   Pes planus    Managed with surgery,  For 25 years.  New orthotics prescribed by [place] at [facility] per Feb  2023 visit .  She has not had the orthotics visit yet.        Routine general medical examination at a health care facility    age appropriate education and counseling updated, referrals for preventative services and immunizations addressed, dietary and smoking counseling addressed, most recent labs reviewed.  I have personally reviewed and have noted:   1) the patient's medical and social history 2) The pt's use of alcohol, tobacco, and illicit drugs 3) The patient's current medications and supplements 4) Functional ability including ADL's, fall risk, home safety risk, hearing and visual impairment 5) Diet and physical activities 6) Evidence for depression or mood disorder 7) The patient's height, weight, and BMI have been recorded in the chart 8) I have ordered and reviewed a 12 lead EKG and find that there are no acute changes and patient is in sinus rhythm.     I have made referrals, and provided counseling and education based on review of the above      Other Visit Diagnoses     Family history of colon cancer requiring screening colonoscopy       Relevant Orders   Ambulatory referral to Gastroenterology       I have discontinued Rodman Pickle. Kiang's diclofenac. I have also changed her triamcinolone cream and ketoconazole. Additionally, I am having her start on doxycycline. Lastly, I am having her maintain her Vitamin D3, fluticasone, Estradiol, levothyroxine, and EPINEPHrine.  Meds ordered this encounter  Medications   triamcinolone cream (KENALOG) 0.1 %    Sig: Apply 1 application. topically 2 (two) times daily. As needed for rash    Dispense:  80 g    Refill:  0   ketoconazole (NIZORAL) 2 % cream    Sig: Apply 1 application. topically daily.    Dispense:  15 g    Refill:  0   doxycycline (VIBRA-TABS) 100 MG tablet    Sig: Take 1 tablet (100 mg total) by mouth 2 (two) times daily.    Dispense:  20 tablet    Refill:  0    Medications Discontinued During This Encounter   Medication Reason   diclofenac (VOLTAREN) 50 MG EC tablet    ketoconazole (NIZORAL) 2 % cream Reorder   triamcinolone cream (KENALOG) 0.1 % Reorder    Follow-up: No follow-ups on file.   [name], MD

## 2022-01-03 ENCOUNTER — Telehealth: Payer: Self-pay

## 2022-01-03 ENCOUNTER — Other Ambulatory Visit: Payer: Self-pay | Admitting: Internal Medicine

## 2022-01-03 ENCOUNTER — Encounter: Payer: Self-pay | Admitting: Internal Medicine

## 2022-01-03 DIAGNOSIS — E063 Autoimmune thyroiditis: Secondary | ICD-10-CM

## 2022-01-03 LAB — COMPREHENSIVE METABOLIC PANEL
ALT: 21 U/L (ref 0–35)
AST: 20 U/L (ref 0–37)
Albumin: 4.5 g/dL (ref 3.5–5.2)
Alkaline Phosphatase: 51 U/L (ref 39–117)
BUN: 17 mg/dL (ref 6–23)
CO2: 26 mEq/L (ref 19–32)
Calcium: 9.7 mg/dL (ref 8.4–10.5)
Chloride: 102 mEq/L (ref 96–112)
Creatinine, Ser: 0.88 mg/dL (ref 0.40–1.20)
GFR: 71.89 mL/min (ref >60.00)
Glucose, Bld: 113 mg/dL — ABNORMAL HIGH (ref 70–99)
Potassium: 4.1 mEq/L (ref 3.5–5.1)
Sodium: 138 mEq/L (ref 135–145)
Total Bilirubin: 0.4 mg/dL (ref 0.2–1.2)
Total Protein: 7 g/dL (ref 6.0–8.3)

## 2022-01-03 LAB — CBC WITH DIFFERENTIAL/PLATELET
Basophils Absolute: 0 10*3/uL (ref 0.0–0.1)
Basophils Relative: 0.9 % (ref 0.0–3.0)
Eosinophils Absolute: 0 10*3/uL (ref 0.0–0.7)
Eosinophils Relative: 0.8 % (ref 0.0–5.0)
HCT: 35.1 % — ABNORMAL LOW (ref 36.0–46.0)
Hemoglobin: 11.9 g/dL — ABNORMAL LOW (ref 12.0–15.0)
Lymphocytes Relative: 23.4 % (ref 12.0–46.0)
Lymphs Abs: 1.2 10*3/uL (ref 0.7–4.0)
MCHC: 33.9 g/dL (ref 30.0–36.0)
MCV: 91.5 fl (ref 78.0–100.0)
Monocytes Absolute: 0.2 10*3/uL (ref 0.1–1.0)
Monocytes Relative: 4.6 % (ref 3.0–12.0)
Neutro Abs: 3.6 10*3/uL (ref 1.4–7.7)
Neutrophils Relative %: 70.3 % (ref 43.0–77.0)
Platelets: 226 10*3/uL (ref 150.0–400.0)
RBC: 3.84 Mil/uL — ABNORMAL LOW (ref 3.87–5.11)
RDW: 12.7 % (ref 11.5–15.5)
WBC: 5.2 10*3/uL (ref 4.0–10.5)

## 2022-01-03 LAB — TSH: TSH: 0.51 u[IU]/mL (ref 0.35–5.50)

## 2022-01-03 LAB — LIPID PANEL W/REFLEX DIRECT LDL
Cholesterol: 196 mg/dL (ref <200)
HDL: 69 mg/dL (ref >=50)
LDL Cholesterol (Calc): 110 mg/dL (calc) — ABNORMAL HIGH
Non-HDL Cholesterol (Calc): 127 mg/dL (calc) (ref <130)
Total CHOL/HDL Ratio: 2.8 (calc) (ref <5.0)
Triglycerides: 83 mg/dL (ref <150)

## 2022-01-03 LAB — HEMOGLOBIN A1C: Hgb A1c MFr Bld: 6.5 % (ref 4.6–6.5)

## 2022-01-03 MED ORDER — LEVOTHYROXINE SODIUM 88 MCG PO TABS: ORAL_TABLET | ORAL | 0 refills | Status: DC

## 2022-01-03 MED ORDER — LEVOTHYROXINE SODIUM 100 MCG PO TABS: ORAL_TABLET | ORAL | 1 refills | Status: DC

## 2022-01-03 NOTE — Assessment & Plan Note (Signed)
TSH <  1.0 on 600 mcg weekly dose.  Patient endorses fatiigue,  Will reduce to 588 and recheck in 6 weeks.

## 2022-01-04 ENCOUNTER — Encounter: Payer: Self-pay | Admitting: Obstetrics & Gynecology

## 2022-01-04 ENCOUNTER — Other Ambulatory Visit (HOSPITAL_COMMUNITY)
Admission: RE | Admit: 2022-01-04 | Discharge: 2022-01-04 | Disposition: A | Discharge status: Home / Self Care | Payer: Federal, State, Local not specified - PPO | Source: Ambulatory Visit | Attending: Obstetrics & Gynecology | Admitting: Obstetrics & Gynecology

## 2022-01-04 ENCOUNTER — Ambulatory Visit (INDEPENDENT_AMBULATORY_CARE_PROVIDER_SITE_OTHER): Payer: Federal, State, Local not specified - PPO | Admitting: Obstetrics & Gynecology

## 2022-01-04 VITALS — BP 112/64 | HR 64 | Resp 16 | Ht 67.25 in | Wt 129.0 lb

## 2022-01-04 DIAGNOSIS — Z01419 Encounter for gynecological examination (general) (routine) without abnormal findings: Secondary | ICD-10-CM

## 2022-01-04 DIAGNOSIS — Z780 Asymptomatic menopausal state: Secondary | ICD-10-CM

## 2022-01-04 NOTE — Progress Notes (Signed)
Rebecca Cole 07-02-1961 [id number]   History:    61 y.o.  G0 Single.  Retired.   RP:  Established patient presenting for annual gyn exam    HPI: Postmenopause, well on no HRT.  No PMB.  Mild hot flushes and night sweats, tolerable.  No pelvic pain.  Currently abstinent, no IC x last pap 10/2019 neg/HPV HR neg.  Pap Reflex today. Urine and bowel movements normal.  Breasts normal. Mammo Neg 03/2021.  BMI 20.05.  Good fitness, working in the yard, started sailing :) and healthy nutrition. Health labs done with Fam MD. Hypothyroidism on Levothyroxine.  Colono 06/2017, every 5 yrs because of Fam H/O Colon Ca.   Past medical history,surgical history, family history and social history were all reviewed and documented in the EPIC chart.  Gynecologic History Patient's last menstrual period was 10/01/2019.  Obstetric History OB History  Gravida Para Term Preterm AB Living  0 0 0 0 0 0  SAB IAB Ectopic Multiple Live Births  0 0 0 0 0     ROS: A ROS was performed and pertinent positives and negatives are included in the history.  GENERAL: No fevers or chills. HEENT: No change in vision, no earache, sore throat or sinus congestion. NECK: No pain or stiffness. CARDIOVASCULAR: No chest pain or pressure. No palpitations. PULMONARY: No shortness of breath, cough or wheeze. GASTROINTESTINAL: No abdominal pain, nausea, vomiting or diarrhea, melena or bright red blood per rectum. GENITOURINARY: No urinary frequency, urgency, hesitancy or dysuria. MUSCULOSKELETAL: No joint or muscle pain, no back pain, no recent trauma. DERMATOLOGIC: No rash, no itching, no lesions. ENDOCRINE: No polyuria, polydipsia, no heat or cold intolerance. No recent change in weight. HEMATOLOGICAL: No anemia or easy bruising or bleeding. NEUROLOGIC: No headache, seizures, numbness, tingling or weakness. PSYCHIATRIC: No depression, no loss of interest in normal activity or change in sleep pattern.     Exam:   BP 112/64    Pulse 64   Resp 16   Ht 5' 7.25" (1.708 m)   Wt 129 lb (58.5 kg)   LMP 10/01/2019   BMI 20.05 kg/m   Body mass index is 20.05 kg/m.  General appearance : Well developed well nourished female. No acute distress HEENT: Eyes: no retinal hemorrhage or exudates,  Neck supple, trachea midline, no carotid bruits, no thyroidmegaly Lungs: Clear to auscultation, no rhonchi or wheezes, or rib retractions  Heart: Regular rate and rhythm, no murmurs or gallops Breast:Examined in sitting and supine position were symmetrical in appearance, no palpable masses or tenderness,  no skin retraction, no nipple inversion, no nipple discharge, no skin discoloration, no axillary or supraclavicular lymphadenopathy Abdomen: no palpable masses or tenderness, no rebound or guarding Extremities: no edema or skin discoloration or tenderness  Pelvic: Vulva: Normal             Vagina: No gross lesions or discharge  Cervix: No gross lesions or discharge.  Pap reflex done.  Uterus  AV, normal size, shape and consistency, non-tender and mobile  Adnexa  Without masses or tenderness  Anus: Normal   Assessment/Plan:  61 y.o. female for annual exam   1. Encounter for routine gynecological examination with Papanicolaou smear of cervix Postmenopause, well on no HRT.  No PMB.  Mild hot flushes and night sweats, tolerable.  No pelvic pain.  Currently abstinent, no IC x last pap 10/2019 neg/HPV HR neg.  Pap Reflex today. Urine and bowel movements normal.  Breasts normal. Mammo Neg 03/2021.  BMI 20.05.  Good fitness, working in the yard, started sailing :) and healthy nutrition. Health labs done with Fam MD. Hypothyroidism on Levothyroxine.  Colono 06/2017, every 5 yrs because of Fam H/O Colon Ca. - Cytology - PAP( Kismet)  2. Postmenopause Postmenopause, well on no HRT.  No PMB.  Mild hot flushes and night sweats, tolerable.  No pelvic pain.  Currently abstinent.  Other orders - Teprotumumab-trbw (TEPEZZA IV); Inject  into the vein.   Princess Bruins MD, 12:00 PM 01/04/2022

## 2022-01-05 DIAGNOSIS — E0500 Thyrotoxicosis with diffuse goiter without thyrotoxic crisis or storm: Secondary | ICD-10-CM | POA: Diagnosis not present

## 2022-01-09 LAB — CYTOLOGY - PAP: Diagnosis: NEGATIVE

## 2022-01-12 DIAGNOSIS — H903 Sensorineural hearing loss, bilateral: Secondary | ICD-10-CM | POA: Diagnosis not present

## 2022-01-18 DIAGNOSIS — M9911 Subluxation complex (vertebral) of cervical region: Secondary | ICD-10-CM | POA: Diagnosis not present

## 2022-01-18 DIAGNOSIS — M9912 Subluxation complex (vertebral) of thoracic region: Secondary | ICD-10-CM | POA: Diagnosis not present

## 2022-01-18 DIAGNOSIS — M9906 Segmental and somatic dysfunction of lower extremity: Secondary | ICD-10-CM | POA: Diagnosis not present

## 2022-01-18 DIAGNOSIS — M9913 Subluxation complex (vertebral) of lumbar region: Secondary | ICD-10-CM | POA: Diagnosis not present

## 2022-01-30 ENCOUNTER — Ambulatory Visit: Payer: Federal, State, Local not specified - PPO | Admitting: Internal Medicine

## 2022-02-08 ENCOUNTER — Encounter: Payer: Self-pay | Admitting: Internal Medicine

## 2022-02-08 ENCOUNTER — Ambulatory Visit (INDEPENDENT_AMBULATORY_CARE_PROVIDER_SITE_OTHER): Payer: Federal, State, Local not specified - PPO | Admitting: Internal Medicine

## 2022-02-08 VITALS — BP 90/60 | HR 50 | Temp 98.0°F | Ht 68.0 in | Wt 132.2 lb

## 2022-02-08 DIAGNOSIS — E0500 Thyrotoxicosis with diffuse goiter without thyrotoxic crisis or storm: Secondary | ICD-10-CM

## 2022-02-08 DIAGNOSIS — E559 Vitamin D deficiency, unspecified: Secondary | ICD-10-CM | POA: Diagnosis not present

## 2022-02-08 DIAGNOSIS — R7309 Other abnormal glucose: Secondary | ICD-10-CM

## 2022-02-08 DIAGNOSIS — D649 Anemia, unspecified: Secondary | ICD-10-CM

## 2022-02-08 DIAGNOSIS — R7301 Impaired fasting glucose: Secondary | ICD-10-CM

## 2022-02-08 DIAGNOSIS — E063 Autoimmune thyroiditis: Secondary | ICD-10-CM | POA: Diagnosis not present

## 2022-02-08 LAB — BASIC METABOLIC PANEL
BUN: 18 mg/dL (ref 6–23)
CO2: 28 mEq/L (ref 19–32)
Calcium: 9.4 mg/dL (ref 8.4–10.5)
Chloride: 103 mEq/L (ref 96–112)
Creatinine, Ser: 0.79 mg/dL (ref 0.40–1.20)
GFR: 81.76 mL/min (ref >60.00)
Glucose, Bld: 113 mg/dL — ABNORMAL HIGH (ref 70–99)
Potassium: 4.2 mEq/L (ref 3.5–5.1)
Sodium: 139 mEq/L (ref 135–145)

## 2022-02-08 LAB — CBC WITH DIFFERENTIAL/PLATELET
Basophils Absolute: 0 10*3/uL (ref 0.0–0.1)
Basophils Relative: 0.7 % (ref 0.0–3.0)
Eosinophils Absolute: 0.1 10*3/uL (ref 0.0–0.7)
Eosinophils Relative: 2.1 % (ref 0.0–5.0)
HCT: 35.8 % — ABNORMAL LOW (ref 36.0–46.0)
Hemoglobin: 11.9 g/dL — ABNORMAL LOW (ref 12.0–15.0)
Lymphocytes Relative: 31.5 % (ref 12.0–46.0)
Lymphs Abs: 1.1 10*3/uL (ref 0.7–4.0)
MCHC: 33.3 g/dL (ref 30.0–36.0)
MCV: 93 fl (ref 78.0–100.0)
Monocytes Absolute: 0.2 10*3/uL (ref 0.1–1.0)
Monocytes Relative: 6.3 % (ref 3.0–12.0)
Neutro Abs: 2.1 10*3/uL (ref 1.4–7.7)
Neutrophils Relative %: 59.4 % (ref 43.0–77.0)
Platelets: 235 10*3/uL (ref 150.0–400.0)
RBC: 3.85 Mil/uL — ABNORMAL LOW (ref 3.87–5.11)
RDW: 13.4 % (ref 11.5–15.5)
WBC: 3.5 10*3/uL — ABNORMAL LOW (ref 4.0–10.5)

## 2022-02-08 LAB — B12 AND FOLATE PANEL
Folate: 10.1 ng/mL (ref >5.9)
Vitamin B-12: 261 pg/mL (ref 211–911)

## 2022-02-08 LAB — TSH: TSH: 1.37 u[IU]/mL (ref 0.35–5.50)

## 2022-02-08 LAB — VITAMIN D 25 HYDROXY (VIT D DEFICIENCY, FRACTURES): VITD: 32.96 ng/mL (ref 30.00–100.00)

## 2022-02-08 NOTE — Assessment & Plan Note (Signed)
She has discontinued  treatment with Tepezza  prescribed by [facility] ophthalmologist [facility] after 5 doses due to d 30% loss in hearing documented with serial hearing tests done by  Margaretha Sheffield.  She is need of a new ophthalmologist given the change in vision in her right eye.  Referral will be initiated after consulting with Dr Kathyrn Sheriff and other colleagues

## 2022-02-08 NOTE — Progress Notes (Signed)
Subjective:  Patient ID: Rebecca Cole, female    DOB: 04-28-1961  Age: 61 y.o. MRN: [id number]  CC: The primary encounter diagnosis was Elevated hemoglobin A1c. Diagnoses of Hypothyroidism, acquired, autoimmune, Anemia, unspecified type, Vitamin D deficiency, Impaired fasting glucose, and Graves' orbitopathy were also pertinent to this visit.   HPI Rebecca Cole presents for follow up on an abnormal A1c of 6.5.    Rebecca Cole is a 61 yr old female with a history of Grave's Disease and impaired fasting glucose  with non diagnostic A1cs of 6.2 or less for the past 3 years.  She has maintained a BMI < 22 for all of her adult life.  She recent began treatment for Graves opthalmopathy with Netty Starring,  but has recently stopped the treatment due to documented  hearing loss of  30% , done with serial measurements by Dr Kathyrn Sheriff.  Since stopping the medication,  her knee pain has improved, but she has developed  blurriness and dbl vision in right eye .    Recently her A1c was checked and had risen to 6.5 Fasting glucoses have not been diagnostic of diabetes.      Outpatient Medications Prior to Visit  Medication Sig Dispense Refill   Cholecalciferol (VITAMIN D3) 1000 UNITS CAPS Take 1 capsule by mouth daily.     doxycycline (VIBRA-TABS) 100 MG tablet Take 1 tablet (100 mg total) by mouth 2 (two) times daily. 20 tablet 0   EPINEPHRINE 0.3 mg/0.3 mL IJ SOAJ injection INJECT 0.3 MLS (0.3 MG TOTAL) INTO THE MUSCLE AS NEEDED FOR ANAPHYLAXIS. 2 each 1   Estradiol 10 MCG TABS vaginal tablet Place 1 tablet (10 mcg total) vaginally 2 (two) times a week. 24 tablet 4   fluticasone (FLONASE) 50 MCG/ACT nasal spray Place 2 sprays into both nostrils daily. 16 g 2   ketoconazole (NIZORAL) 2 % cream Apply 1 application. topically daily. 15 g 0   levothyroxine (SYNTHROID) 100 MCG tablet One tablet before breakfast 5 days per week (and 88 mcg tablet one day per week ) 90 tablet 1   levothyroxine (SYNTHROID) 88 MCG  tablet One tablet weekly (and 1 tablet of 100 mcg daily x 5 days weekly) 30 tablet 0   Teprotumumab-trbw (TEPEZZA IV) Inject into the vein.     triamcinolone cream (KENALOG) 0.1 % Apply 1 application. topically 2 (two) times daily. As needed for rash 80 g 0   No facility-administered medications prior to visit.    Review of Systems;  Patient denies headache, fevers, malaise, unintentional weight loss, skin rash, eye pain, sinus congestion and sinus pain, sore throat, dysphagia,  hemoptysis , cough, dyspnea, wheezing, chest pain, palpitations, orthopnea, edema, abdominal pain, nausea, melena, diarrhea, constipation, flank pain, dysuria, hematuria, urinary  Frequency, nocturia, numbness, tingling, seizures,  Focal weakness, Loss of consciousness,  Tremor, insomnia, depression, anxiety, and suicidal ideation.      Objective:  BP 90/60 (BP Location: Right Arm, Patient Position: Sitting, Cuff Size: Normal)   Pulse (!) 50   Temp 98 F (36.7 C) (Oral)   Ht '5\' 8"'$  (1.727 m)   Wt 132 lb 3.2 oz (60 kg)   LMP 10/01/2019   SpO2 98%   BMI 20.10 kg/m   BP Readings from Last 3 Encounters:  02/08/22 90/60  01/04/22 112/64  01/02/22 112/62    Wt Readings from Last 3 Encounters:  02/08/22 132 lb 3.2 oz (60 kg)  01/04/22 129 lb (58.5 kg)  01/02/22 131 lb 11.2 oz (  59.7 kg)    General appearance: alert, cooperative and appears stated age Neck: no adenopathy, no carotid bruit, supple, symmetrical, trachea midline and thyroid not enlarged, symmetric, no tenderness/mass/nodules Back: symmetric, no curvature. ROM normal. No CVA tenderness. Lungs: clear to auscultation bilaterally Heart: regular rate and rhythm, S1, S2 normal, no murmur, click, rub or gallop Abdomen: soft, non-tender; bowel sounds normal; no masses,  no organomegaly Pulses: 2+ and symmetric Skin: Skin color, texture, turgor normal. No rashes or lesions Lab Results  Component Value Date   HGBA1C 6.5 01/02/2022   HGBA1C 6.2  06/26/2021   HGBA1C 6.1 12/28/2020    Lab Results  Component Value Date   CREATININE 0.88 01/02/2022   CREATININE 0.85 06/26/2021   CREATININE 0.73 12/28/2020    Lab Results  Component Value Date   WBC 5.2 01/02/2022   HGB 11.9 (L) 01/02/2022   HCT 35.1 (L) 01/02/2022   PLT 226.0 01/02/2022   GLUCOSE 113 (H) 01/02/2022   CHOL 196 01/02/2022   TRIG 83 01/02/2022   HDL 69 01/02/2022   LDLDIRECT 132.0 01/11/2015   LDLCALC 110 (H) 01/02/2022   ALT 21 01/02/2022   AST 20 01/02/2022   NA 138 01/02/2022   K 4.1 01/02/2022   CL 102 01/02/2022   CREATININE 0.88 01/02/2022   BUN 17 01/02/2022   CO2 26 01/02/2022   TSH 0.51 01/02/2022   HGBA1C 6.5 01/02/2022    No results found.  Assessment & Plan:   Problem List Items Addressed This Visit     Graves' orbitopathy    She has discontinued  treatment with Tepezza  prescribed by [facility] ophthalmologist [facility] after 5 doses due to d 30% loss in hearing documented with serial hearing tests done by  Margaretha Sheffield.  She is need of a new ophthalmologist given the change in vision in her right eye.  Referral will be initiated after consulting with Dr Kathyrn Sheriff and other colleagues       Hypothyroidism, acquired, autoimmune   Relevant Orders   TSH   Vitamin D deficiency   Relevant Orders   VITAMIN D 25 Hydroxy (Vit-D Deficiency, Fractures)   Impaired fasting glucose    Hyperglycemia has been reported with use of Tepezza , mostly in patients with preexisting diabetes or IPG.  Will check, B12/folate as these have not been done and may falsely elevated A1c if deficient.  Also checking fructosamine.  Patient has already started restricting her carbohydrates/ .       Other Visit Diagnoses     Elevated hemoglobin A1c    -  Primary   Relevant Orders   B12 and Folate Panel   Fructosamine   Basic metabolic panel   Anemia, unspecified type       Relevant Orders   CBC with Differential/Platelet       I spent a total of 41 minutes  with this patient in a face to face visit on the date of this encounter reviewing the last office visit with me  in May,  most recent with patient's  previous opthalmologist at [facility] In November , patient's diet and eating habits, home blood pressure readings ,  most recent imaging study ,   and post visit ordering of testing and therapeutics.    Follow-up: No follow-ups on file.   [name], MD

## 2022-02-08 NOTE — Assessment & Plan Note (Signed)
Hyperglycemia has been reported with use of Tepezza , mostly in patients with preexisting diabetes or IPG.  Will check, B12/folate as these have not been done and may falsely elevated A1c if deficient.  Also checking fructosamine.  Patient has already started restricting her carbohydrates/ .

## 2022-02-09 DIAGNOSIS — H2589 Other age-related cataract: Secondary | ICD-10-CM | POA: Diagnosis not present

## 2022-02-09 DIAGNOSIS — E0500 Thyrotoxicosis with diffuse goiter without thyrotoxic crisis or storm: Secondary | ICD-10-CM | POA: Diagnosis not present

## 2022-02-09 DIAGNOSIS — H43813 Vitreous degeneration, bilateral: Secondary | ICD-10-CM | POA: Diagnosis not present

## 2022-02-11 ENCOUNTER — Encounter: Payer: Self-pay | Admitting: Internal Medicine

## 2022-02-11 LAB — FRUCTOSAMINE: Fructosamine: 274 umol/L (ref 205–285)

## 2022-02-13 ENCOUNTER — Ambulatory Visit (INDEPENDENT_AMBULATORY_CARE_PROVIDER_SITE_OTHER): Payer: Federal, State, Local not specified - PPO | Admitting: Family Medicine

## 2022-02-13 ENCOUNTER — Ambulatory Visit: Payer: Self-pay

## 2022-02-13 ENCOUNTER — Encounter: Payer: Self-pay | Admitting: Family Medicine

## 2022-02-13 ENCOUNTER — Ambulatory Visit (INDEPENDENT_AMBULATORY_CARE_PROVIDER_SITE_OTHER): Payer: Federal, State, Local not specified - PPO

## 2022-02-13 ENCOUNTER — Ambulatory Visit: Payer: Federal, State, Local not specified - PPO

## 2022-02-13 VITALS — BP 138/82 | HR 51 | Ht 68.0 in

## 2022-02-13 DIAGNOSIS — M1710 Unilateral primary osteoarthritis, unspecified knee: Secondary | ICD-10-CM | POA: Diagnosis not present

## 2022-02-13 DIAGNOSIS — M25562 Pain in left knee: Secondary | ICD-10-CM | POA: Diagnosis not present

## 2022-02-13 DIAGNOSIS — E538 Deficiency of other specified B group vitamins: Secondary | ICD-10-CM

## 2022-02-13 DIAGNOSIS — G8929 Other chronic pain: Secondary | ICD-10-CM

## 2022-02-13 DIAGNOSIS — M25561 Pain in right knee: Secondary | ICD-10-CM | POA: Diagnosis not present

## 2022-02-13 MED ORDER — CYANOCOBALAMIN 1000 MCG/ML IJ SOLN
1000.0000 ug | Freq: Once | INTRAMUSCULAR | Status: AC
  Administered 2022-02-13: 1000 ug via INTRAMUSCULAR

## 2022-02-13 NOTE — Progress Notes (Signed)
[place] Phone: [phone] Subjective:   Rebecca Cole, am serving as a scribe for Dr. Hulan Saas. I'm seeing this patient by the request  of:  [name], MD  CC: Bilateral knee pain  HYQ:[id number]  Rebecca Cole is a 61 y.o. female coming in with complaint of B knee pain. Last seen in 2019. Patient states that she feels that she has never felt 100% since her last appt with [place]. Notes a catching with knee extension that has subsided once she started massaging hamstring tendons. Also notes pain in both knees going up stairs. R knee pain is above patella. Has on topeza but has discontinued this medication. Diagnosed with B12 deficiency just yesterday. Also started menopause last year.      Past Medical History:  Diagnosis Date   Graves Disease Sept 2010   s/p 131 ablation, managed by [facility] Dr. Karle Starch' orbitopathy 2010   managed by [facility]   Past Surgical History:  Procedure Laterality Date   BREAST BIOPSY  2006   negative   BREAST CYST EXCISION Right 2006   Social History   Socioeconomic History   Marital status: Single    Spouse name: Not on file   Number of children: Not on file   Years of education: Not on file   Highest education level: Not on file  Occupational History   Occupation: full-time air traffic controller    Employer: [employer]  Tobacco Use   Smoking status: Never   Smokeless tobacco: Never  Vaping Use   Vaping Use: Never used  Substance and Sexual Activity   Alcohol use: Yes    Comment: 3-4 a month   Drug use: Cole   Sexual activity: Not Currently    Birth control/protection: Post-menopausal, Abstinence  Other Topics Concern   Not on file  Social History Narrative   Full-time air traffic controller, has Well[employer] geologist.   Has Cole pets   Social Determinants of Radio broadcast assistant Strain: Not on file  Food  Insecurity: Not on file  Transportation Needs: Not on file  Physical Activity: Not on file  Stress: Not on file  Social Connections: Not on file   Allergies  Allergen Reactions   Methimazole    Family History  Problem Relation Age of Onset   Dementia Mother    Heart disease Father        CABG, Ablation for Atrial Flutter, valve replacement, pacemaker, pericardial effusion s/p window   Cancer Father        late 34's history of colon CA   Cancer Maternal Grandmother        possibly benign   Breast cancer Maternal Grandmother     Current Outpatient Medications (Endocrine & Metabolic):    levothyroxine (SYNTHROID) 100 MCG tablet, One tablet before breakfast 5 days per week (and 88 mcg tablet one day per week )   levothyroxine (SYNTHROID) 88 MCG tablet, One tablet weekly (and 1 tablet of 100 mcg daily x 5 days weekly)   Teprotumumab-trbw (TEPEZZA IV), Inject into the vein.  Current Outpatient Medications (Cardiovascular):    EPINEPHRINE 0.3 mg/0.3 mL IJ SOAJ injection, INJECT 0.3 MLS (0.3 MG TOTAL) INTO THE MUSCLE AS NEEDED FOR ANAPHYLAXIS.  Current Outpatient Medications (Respiratory):    fluticasone (FLONASE) 50 MCG/ACT nasal spray, Place 2 sprays into both nostrils daily.    Current Outpatient Medications (  Other):    Cholecalciferol (VITAMIN D3) 1000 UNITS CAPS, Take 1 capsule by mouth daily.   doxycycline (VIBRA-TABS) 100 MG tablet, Take 1 tablet (100 mg total) by mouth 2 (two) times daily.   Estradiol 10 MCG TABS vaginal tablet, Place 1 tablet (10 mcg total) vaginally 2 (two) times a week.   ketoconazole (NIZORAL) 2 % cream, Apply 1 application. topically daily.   triamcinolone cream (KENALOG) 0.1 %, Apply 1 application. topically 2 (two) times daily. As needed for rash   Reviewed prior external information including notes and imaging from  primary care provider As well as notes that were available from care everywhere and other healthcare systems.  Past medical  history, social, surgical and family history all reviewed in electronic medical record.  Cole pertanent information unless stated regarding to the chief complaint.   Review of Systems:  Cole headache, visual changes, nausea, vomiting, diarrhea, constipation, dizziness, abdominal pain, skin rash, fevers, chills, night sweats, weight loss, swollen lymph nodes, body aches, joint swelling, chest pain, shortness of breath, mood changes. POSITIVE muscle aches  Objective  Blood pressure 138/82, pulse (!) 51, height '5\' 8"'$  (1.727 m), last menstrual period 10/01/2019, SpO2 98 %.   General: Cole apparent distress alert and oriented x3 mood and affect normal, dressed appropriately.  HEENT: Pupils equal, extraocular movements intact  Respiratory: Patient's speak in full sentences and does not appear short of breath  Cardiovascular: Cole lower extremity edema, non tender, Cole erythema  Bilateral knees do have some tenderness to palpation anteriorly.  Mild lateral tracking with crepitus noted left greater than right.  Cole significant instability of the knee that with valgus or varus force.   Limited muscular skeletal ultrasound was performed and interpreted by Hulan Saas, M  Limited ultrasound of patient's knee show the patient does have some narrowing of the patellofemoral joint bilaterally.  Trace effusion noted with chronic changes of the patellofemoral joint as well as findings consistent with a synovitis. Impression: Patellofemoral arthritis with synovitis   Impression and Recommendations:     The above documentation has been reviewed and is accurate and complete [name], DO

## 2022-02-13 NOTE — Assessment & Plan Note (Signed)
Patient does have what appears to be more patellofemoral arthritis bilaterally.  We discussed with patient about icing regimen, home exercises, focusing on the VMO strengthening.  We will hold on anything such as injections with patient having recent cataracts and patient having to be on steroids for a long time.  Patient wants to avoid any injections but given information about viscosupplementation or the possibility of PRP.  Patient will try the conservative therapy first and follow-up again in 6 to 8 weeks.  Patient was willing to try physical therapy and will be sent which I think patient will respond very well.

## 2022-02-13 NOTE — Progress Notes (Signed)
Patient presented for B 12 injection to left deltoid, patient voiced no concerns nor showed any signs of distress during injection. 

## 2022-02-13 NOTE — Patient Instructions (Addendum)
Xray today Exercises Voltaren 2x a day Ice 20 min 2x a day PT [facility]-They will call you See me again in 6-8 weeks

## 2022-02-15 DIAGNOSIS — H25811 Combined forms of age-related cataract, right eye: Secondary | ICD-10-CM | POA: Diagnosis not present

## 2022-02-20 ENCOUNTER — Ambulatory Visit (INDEPENDENT_AMBULATORY_CARE_PROVIDER_SITE_OTHER): Payer: Federal, State, Local not specified - PPO

## 2022-02-20 DIAGNOSIS — E538 Deficiency of other specified B group vitamins: Secondary | ICD-10-CM

## 2022-02-20 MED ORDER — CYANOCOBALAMIN 1000 MCG/ML IJ SOLN
1000.0000 ug | Freq: Once | INTRAMUSCULAR | Status: AC
  Administered 2022-02-20: 1000 ug via INTRAMUSCULAR

## 2022-02-20 NOTE — Progress Notes (Signed)
Patient presented for B 12 injection to right deltoid, patient voiced no concerns nor showed any signs of distress during injection. 

## 2022-02-27 ENCOUNTER — Ambulatory Visit: Payer: Federal, State, Local not specified - PPO

## 2022-03-01 ENCOUNTER — Ambulatory Visit: Payer: Federal, State, Local not specified - PPO | Admitting: Family Medicine

## 2022-03-01 ENCOUNTER — Ambulatory Visit (INDEPENDENT_AMBULATORY_CARE_PROVIDER_SITE_OTHER): Payer: Federal, State, Local not specified - PPO

## 2022-03-01 DIAGNOSIS — E538 Deficiency of other specified B group vitamins: Secondary | ICD-10-CM

## 2022-03-01 MED ORDER — CYANOCOBALAMIN 1000 MCG/ML IJ SOLN
1000.0000 ug | Freq: Once | INTRAMUSCULAR | Status: AC
  Administered 2022-03-01: 1000 ug via INTRAMUSCULAR

## 2022-03-01 NOTE — Progress Notes (Signed)
presents today for injection per MD orders. B12 injection administered IM in left Upper Arm. Administration without incident. Patient tolerated well.  [employer],cma   

## 2022-03-06 ENCOUNTER — Ambulatory Visit: Payer: Federal, State, Local not specified - PPO

## 2022-03-08 ENCOUNTER — Encounter (HOSPITAL_BASED_OUTPATIENT_CLINIC_OR_DEPARTMENT_OTHER): Payer: Self-pay | Admitting: Physical Therapy

## 2022-03-08 ENCOUNTER — Ambulatory Visit (INDEPENDENT_AMBULATORY_CARE_PROVIDER_SITE_OTHER): Payer: Federal, State, Local not specified - PPO

## 2022-03-08 ENCOUNTER — Telehealth: Payer: Self-pay | Admitting: *Deleted

## 2022-03-08 ENCOUNTER — Other Ambulatory Visit: Payer: Self-pay

## 2022-03-08 ENCOUNTER — Ambulatory Visit (HOSPITAL_BASED_OUTPATIENT_CLINIC_OR_DEPARTMENT_OTHER): Payer: Federal, State, Local not specified - PPO | Attending: Family Medicine | Admitting: Physical Therapy

## 2022-03-08 ENCOUNTER — Other Ambulatory Visit: Payer: Self-pay | Admitting: Internal Medicine

## 2022-03-08 DIAGNOSIS — E538 Deficiency of other specified B group vitamins: Secondary | ICD-10-CM | POA: Diagnosis not present

## 2022-03-08 DIAGNOSIS — M6281 Muscle weakness (generalized): Secondary | ICD-10-CM | POA: Diagnosis not present

## 2022-03-08 DIAGNOSIS — M25561 Pain in right knee: Secondary | ICD-10-CM | POA: Insufficient documentation

## 2022-03-08 DIAGNOSIS — E063 Autoimmune thyroiditis: Secondary | ICD-10-CM | POA: Diagnosis not present

## 2022-03-08 DIAGNOSIS — M25562 Pain in left knee: Secondary | ICD-10-CM | POA: Insufficient documentation

## 2022-03-08 DIAGNOSIS — R262 Difficulty in walking, not elsewhere classified: Secondary | ICD-10-CM | POA: Insufficient documentation

## 2022-03-08 DIAGNOSIS — G8929 Other chronic pain: Secondary | ICD-10-CM | POA: Diagnosis not present

## 2022-03-08 MED ORDER — CYANOCOBALAMIN 1000 MCG/ML IJ SOLN
1000.0000 ug | Freq: Once | INTRAMUSCULAR | Status: AC
  Administered 2022-03-08: 1000 ug via INTRAMUSCULAR

## 2022-03-08 NOTE — Telephone Encounter (Signed)
Pt had 4th B12 injection & labs done today. Only a TSH was ordered. Please let me know what needs to be ran (place future orders) prior to next pickup at 8:15 tomorrow morning,  Thanks

## 2022-03-08 NOTE — Therapy (Signed)
OUTPATIENT PHYSICAL THERAPY LOWER EXTREMITY EVALUATION   Patient Name: Rebecca Cole MRN: [id number] DOB:1961/01/23, 61 y.o., female Today's Date: 03/08/2022   PT End of Session - 03/08/22 0851     Visit Number 1    Number of Visits 19    Date for PT Re-Evaluation 06/06/22    Authorization Type BCBS    PT Start Time 0845    PT Stop Time 0930    PT Time Calculation (min) 45 min    Activity Tolerance Patient tolerated treatment well;Patient limited by pain    Behavior During Therapy [facility] for tasks assessed/performed             Past Medical History:  Diagnosis Date   Graves Disease Sept 2010   s/p 131 ablation, managed by [facility] Dr. Karle Starch' orbitopathy 2010   managed by [facility]   Past Surgical History:  Procedure Laterality Date   BREAST BIOPSY  2006   negative   BREAST CYST EXCISION Right 2006   Patient Active Problem List   Diagnosis Date Noted   Patellofemoral arthritis 02/13/2022   Impaired fasting glucose 02/01/2018   Nonallopathic lesion of cervical region 08/20/2017   Nonallopathic lesion of thoracic region 08/20/2017   Nonallopathic lesion of lumbosacral region 08/20/2017   Pes planus 08/20/2017   Cervical radiculopathy at C5 11/07/2016   Eustachian tube disorder, left 05/08/2016   Vitamin D deficiency 01/04/2014   Routine general medical examination at a health care facility 01/08/2012   Screening for colon cancer 01/08/2012   Graves' orbitopathy    Hypothyroidism, acquired, autoimmune 04/06/2009    PCP: [name], MD  REFERRING PROVIDER: [name], DO  REFERRING DIAG: M25.561,M25.562,G89.29 (ICD-10-CM) - Chronic pain of both knees  THERAPY DIAG:  Pain in joint of right knee  Pain in joint of left knee  Muscle weakness (generalized)  Difficulty walking  Rationale for Evaluation and Treatment Rehabilitation  ONSET DATE: Spring 2022  SUBJECTIVE:   SUBJECTIVE STATEMENT: Pt states  she is here for bilat knee pain with L>R. Pt states this has been issue since she twisted her knee while walking in the dark a few years ago. Imaging shows patellofemoral degeneration. 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 She started squatting and jumping rope around that time too which had caused the pain.  Laying in bed will be dull ache but going down stairs will be feel "almost sharp." Pain is not debilitating but makes her apprehensive. Pt states she has had catching in that L knee. Sitting for too long with knees bent causes pain. Pt states time of day does not affect pain. Pt denies cancer red flags. Pt states she has a history of hypermobility. Knees aching will cause difficulty with sleeping. Denies cancer red flags. Pt does wear orthotics for flat feet.   PERTINENT HISTORY: Pes planus, L knee twisting injury (potential meniscus or L HS)  PAIN:  Are you having pain? Yes: NPRS scale: 0.5/10 Pain location: anterior knee bilaterally Pain description: sharp but mostly dull achey Aggravating factors: stairs, getting off the ground while gardening,  Relieving factors: Diclofinac gel,  ibuprofen  PRECAUTIONS: None  WEIGHT BEARING RESTRICTIONS No  FALLS:  Has patient fallen in last 6 months? 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  Trace effusion noted with chronic changes of the patellofemoral  joint as well as findings consistent with a synovitis. Impression: Patellofemoral arthritis with synovitis   IMPRESSION: No significant osteoarthritis of either knee medial or lateral compartment.   PATIENT SURVEYS:  FOTO 72 78 @ DC 8 pts MCII  COGNITION:  Overall cognitive status: Within functional limits for tasks assessed     SENSATION: WFL  EDEMA: no excessive swelling noted  MUSCLE LENGTH: WFL for both quad and HS, pain with deep knee flexion on L  PALPATION: No TTP noted into patellar tendons or joint lines bilat  Crepitus with pain on L during patellar glides  LOWER EXTREMITY ROM:  Active ROM Right eval Left eval  Hip flexion WNL WNL  Hip extension WNL WNL  Hip abduction WNL WNL  Knee flexion WNL WNL p! At end range  Knee extension WNL -2 compared to R   (Blank rows = not tested)  LOWER EXTREMITY MMT:  MMT Right eval Left eval  Hip flexion 4+/5 4+/5  Hip extension 4+/5 4+/5  Hip abduction 4/5 4/5  Knee flexion 4/5 4/5  Knee extension 4+/5 4+/5   (Blank rows = not tested)  LOWER EXTREMITY SPECIAL TESTS:   Knee special tests: Anterior drawer test: negative, Posterior drawer test: negative, Patellafemoral apprehension test: negative, Patellafemoral grind test: positive , and Step up/down test: positive  Varus/Valgus at 0 negative  FUNCTIONAL TESTS:  Step up/down: No pain noted, mild dynamic valgus and decreased eccentric lowering control  GAIT: Distance walked: 35f Assistive device utilized: None Level of assistance: Complete Independence Comments: WNL, walks with Hokas and custom orthotics on    TODAY'S TREATMENT:   Exercises - Sit to Stand with Resistance Around Legs  - 1 x daily - 3-4 x weekly - 2 sets - 10 reps - Side Stepping with Resistance at Thighs  - 1 x daily - 3-4 x weekly - 2 sets - 10 reps - Wall Squat  - 1 x daily - 3-4 x weekly - 2 sets - 10 reps   PATIENT EDUCATION:  Education details: MOI, diagnosis, prognosis,  anatomy, exercise progression, DOMS expectations, muscle firing,  envelope of function, HEP, POC  Person educated: Patient Education method: Explanation, Demonstration, Tactile cues, Verbal cues, and Handouts Education comprehension: verbalized understanding, returned demonstration, verbal cues required, and tactile cues required   HOME EXERCISE PROGRAM: Access Code: [email or web address]: [email or web address] Date: 03/08/2022 Prepared by: ADaleen Bo ASSESSMENT:  CLINICAL IMPRESSION: Patient is a 61y.o. female who was seen today for physical therapy evaluation and treatment for c/c of bilateral knee pain with L>R. Pt's s/s appear consistent with bilat PFPS with likely contribution from patellofemoral OA. Pt's pain is moderately sensitive with movement especially L>R. Pt with pain with deep knee flexion and compressive forces. Resisted knee extension does not cause pain. Pt does have history of patellofemoral tracking issues and hypermobility which may also contribute to current pain presentation. Pt will largely be strength focused at this time as ROM is generally WFL. Pt would benefit from continued skilled therapy in order to reach goals and maximize  functional bilat knee strength for prevention of functional decline and return to regular home duties and recreation.   OBJECTIVE IMPAIRMENTS decreased mobility, difficulty walking, decreased ROM, decreased strength, increased muscle spasms, improper body mechanics, postural dysfunction, and pain.   ACTIVITY LIMITATIONS lifting, sitting, standing, squatting, stairs, transfers, locomotion level  PARTICIPATION LIMITATIONS: cleaning, laundry, driving, shopping, community activity, occupation, yard work, and exercise  PERSONAL FACTORS Age, Fitness, Time since onset of injury/illness/exacerbation, and 1 comorbidity:    are also affecting patient's functional outcome.   REHAB POTENTIAL: Good  CLINICAL DECISION MAKING:  Stable/uncomplicated  EVALUATION COMPLEXITY: Low   GOALS:   SHORT TERM GOALS: Target date: 04/19/2022  Pt will become independent with HEP in order to demonstrate synthesis of PT education..  Goal status: INITIAL  2.  Pt will be able to demonstrate descending stair pattern without dyanmic valgus in order to demonstrate functional improvement in LE strength and house hold mobility.   Goal status: INITIAL  3.  Pt will be able to demonstrate/report ability to sit/stand/sleep for extended periods of time without pain in order to demonstrate functional improvement and tolerance to static positioning.   Goal status: INITIAL   LONG TERM GOALS: Target date: 05/31/2022   Pt  will become independent with final HEP in order to demonstrate synthesis of PT education.  Goal status: INITIAL  2.  Pt will score >/= 78 on FOTO to demonstrate improvement in perceived bilat knee function.   Goal status: INITIAL  3.  Pt will be able to demonstrate full depth squat without pain in order to demonstrate functional improvement in LE function for self-care and house hold duties. .   Goal status: INITIAL  4.  Pt will be able to demonstrate kneeling to stand and stand to kneeling transfer without pain in order to demonstrate functional improvement in LE function for ADL/house hold duties.   Goal status: INITIAL    PLAN: PT FREQUENCY: 1-2x/week  PT DURATION: 12 weeks (likely DC in 8)  PLANNED INTERVENTIONS: Therapeutic exercises, Therapeutic activity, Neuromuscular re-education, Balance training, Gait training, Patient/Family education, Self Care, and Joint mobilization  PLAN FOR NEXT SESSION: continue with quad and hip strength, check L HS, LAQ with isometric holds, fig 4 or SL bridge holds   Daleen Bo, PT 03/08/2022, 10:42 AM

## 2022-03-08 NOTE — Telephone Encounter (Signed)
Pt mentioned that we were supposed to be checking her B12 levels at her 4th injection.

## 2022-03-08 NOTE — Progress Notes (Signed)
Patient presented for B 12 injection to left deltoid, patient voiced no concerns nor showed any signs of distress during injection. 

## 2022-03-09 ENCOUNTER — Encounter: Payer: Self-pay | Admitting: Internal Medicine

## 2022-03-09 DIAGNOSIS — H903 Sensorineural hearing loss, bilateral: Secondary | ICD-10-CM | POA: Diagnosis not present

## 2022-03-09 DIAGNOSIS — E063 Autoimmune thyroiditis: Secondary | ICD-10-CM

## 2022-03-09 LAB — VITAMIN B12: Vitamin B-12: 786 pg/mL (ref 211–911)

## 2022-03-09 LAB — TSH: TSH: 0.64 u[IU]/mL (ref 0.35–5.50)

## 2022-03-14 ENCOUNTER — Encounter (HOSPITAL_BASED_OUTPATIENT_CLINIC_OR_DEPARTMENT_OTHER): Payer: Federal, State, Local not specified - PPO | Admitting: Physical Therapy

## 2022-03-15 ENCOUNTER — Ambulatory Visit (HOSPITAL_BASED_OUTPATIENT_CLINIC_OR_DEPARTMENT_OTHER): Payer: Federal, State, Local not specified - PPO | Admitting: Physical Therapy

## 2022-03-15 ENCOUNTER — Encounter (HOSPITAL_BASED_OUTPATIENT_CLINIC_OR_DEPARTMENT_OTHER): Payer: Self-pay | Admitting: Physical Therapy

## 2022-03-15 DIAGNOSIS — M6281 Muscle weakness (generalized): Secondary | ICD-10-CM | POA: Diagnosis not present

## 2022-03-15 DIAGNOSIS — M25562 Pain in left knee: Secondary | ICD-10-CM

## 2022-03-15 DIAGNOSIS — G8929 Other chronic pain: Secondary | ICD-10-CM | POA: Diagnosis not present

## 2022-03-15 DIAGNOSIS — M25561 Pain in right knee: Secondary | ICD-10-CM | POA: Diagnosis not present

## 2022-03-15 DIAGNOSIS — R262 Difficulty in walking, not elsewhere classified: Secondary | ICD-10-CM | POA: Diagnosis not present

## 2022-03-15 NOTE — Therapy (Signed)
OUTPATIENT PHYSICAL THERAPY LOWER EXTREMITY EVALUATION   Patient Name: Rebecca Cole MRN: [id number] DOB:[birth year], 61 y.o., female Today's Date: 03/15/2022   PT End of Session - 03/15/22 1644     Visit Number 2    Number of Visits 19    Date for PT Re-Evaluation 06/06/22    Authorization Type BCBS    PT Start Time 1645    PT Stop Time 5400    PT Time Calculation (min) 40 min    Activity Tolerance Patient tolerated treatment well;Patient limited by pain    Behavior During Therapy [facility] for tasks assessed/performed             Past Medical History:  Diagnosis Date   Graves Disease Sept 2010   s/p 131 ablation, managed by [place] [facility] Dr. Karle Starch' orbitopathy 2010   managed by [place]   Past Surgical History:  Procedure Laterality Date   BREAST BIOPSY  2006   negative   BREAST CYST EXCISION Right 2006   Patient Active Problem List   Diagnosis Date Noted   Patellofemoral arthritis 02/13/2022   Impaired fasting glucose 02/01/2018   Nonallopathic lesion of cervical region 08/20/2017   Nonallopathic lesion of thoracic region 08/20/2017   Nonallopathic lesion of lumbosacral region 08/20/2017   Pes planus 08/20/2017   Cervical radiculopathy at C5 11/07/2016   Eustachian tube disorder, left 05/08/2016   Vitamin D deficiency 01/04/2014   Routine general medical examination at a health care facility 01/08/2012   Screening for colon cancer 01/08/2012   Graves' orbitopathy    Hypothyroidism, acquired, autoimmune 04/06/2009    PCP: [name], MD  REFERRING PROVIDER: [name], DO  REFERRING DIAG: M25.561,M25.562,G89.29 (ICD-10-CM) - Chronic pain of both knees  THERAPY DIAG:  Pain in joint of right knee  Pain in joint of left knee  Muscle weakness (generalized)  Difficulty walking  Rationale for Evaluation and Treatment Rehabilitation  ONSET DATE: Spring 2022  SUBJECTIVE:   SUBJECTIVE STATEMENT:  Pt  states that without the topical cream, it was more painful. Bending and unbending fully will cause catching pain. HEP wall sit was painful but is much better now. Able to near 90.    Eval: Pt states she is here for bilat knee pain with L>R. Pt states this has been issue since she twisted her knee while walking in the dark a few years ago. Imaging shows patellofemoral degeneration. 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 She started squatting and jumping rope around that time too which had caused the pain.  Laying in bed will be dull ache but going down stairs will be feel "almost sharp." Pain is not debilitating but makes her apprehensive. Pt states she has had catching in that L knee. Sitting for too long with knees bent causes pain. Pt states time of day does not affect pain. Pt denies cancer red flags. Pt states she has a history of hypermobility. Knees aching will cause difficulty with sleeping. Denies cancer red flags. Pt does wear orthotics for flat feet.   PERTINENT HISTORY: Pes planus, L knee twisting injury (potential meniscus or L HS)  PAIN:  Are you having pain? Yes: NPRS scale: 0.5/10 Pain location: anterior knee bilaterally, HS tendons bilaterally Pain description: sharp but mostly dull achey Aggravating factors: stairs, getting off the  ground while gardening,  Relieving factors: Diclofinac gel,  ibuprofen  PRECAUTIONS: None  WEIGHT BEARING RESTRICTIONS No  FALLS:  Has patient fallen in last 6 months? 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  Trace effusion noted with chronic changes of the patellofemoral joint as well as findings consistent with a synovitis. Impression: Patellofemoral arthritis with synovitis   IMPRESSION: No significant osteoarthritis of either knee medial or lateral compartment.   PATIENT SURVEYS:  FOTO 72 78 @ DC 8 pts MCII  TODAY'S TREATMENT: Exercises  Recumbent bike seat L2 5 min  -fig 4bridge 2x10 each side  -LAQ 2x10 10lbs pain free range 5s hold  - Sit to Stand with Resistance Around Legs  - 1 x daily - 3-4 x weekly - 2 sets - 10 reps - Side Stepping with Resistance at Thighs  - 1 x daily - 3-4 x weekly - 2 sets - 10 reps - Wall Squat  - 1 x daily - 3-4 x weekly - 2 sets - 10 reps   PATIENT EDUCATION:  Education details:  anatomy/biomechanics of knee, exercise progression, DOMS expectations, envelope of function, HEP, POC  Person educated: Patient Education method: Explanation, Demonstration, Tactile cues, Verbal cues, and Handouts Education comprehension: verbalized understanding, returned demonstration, verbal cues required, and tactile cues required   HOME EXERCISE PROGRAM: Access Code: [email or web address] URL: [email or web address] Date: 03/08/2022 Prepared by: Daleen Bo  ASSESSMENT:  CLINICAL IMPRESSION: Pt with good response to introduction of knee quad strengthening exercise today. Pt with expected initial discomfort that dissipates with continued repetition. Pt able to perform full arc of motion with end range isometric holds without aggravating knee pain. Pt's report HS pain appears consistent with knee hyperextension tendency as well as relative quad weakness. Plan to do HS curl as well as testing deeper depth squats if no exacerbation in pain. Pt would benefit from continued skilled therapy in order to  reach goals and maximize functional bilat knee strength for prevention of functional decline and return to regular home duties and recreation.   OBJECTIVE IMPAIRMENTS decreased mobility, difficulty walking, decreased ROM, decreased strength, increased muscle spasms, improper body mechanics, postural dysfunction, and pain.   ACTIVITY LIMITATIONS lifting, sitting, standing, squatting, stairs, transfers, locomotion level  PARTICIPATION LIMITATIONS: cleaning, laundry, driving, shopping, community activity, occupation, yard work, and exercise  PERSONAL FACTORS Age, Fitness, Time since onset of injury/illness/exacerbation, and 1 comorbidity:    are also affecting patient's functional outcome.   REHAB POTENTIAL: Good  CLINICAL DECISION MAKING: Stable/uncomplicated  EVALUATION COMPLEXITY: Low   GOALS:   SHORT TERM GOALS: Target date: 04/19/2022  Pt will become independent with HEP in order to demonstrate synthesis of PT education..  Goal status: INITIAL  2.  Pt will be able to demonstrate descending stair pattern without dyanmic valgus in order to demonstrate functional improvement in LE strength and house hold mobility.   Goal status: INITIAL  3.  Pt will be able to demonstrate/report ability to sit/stand/sleep for extended periods of time without pain in order to demonstrate functional improvement and tolerance to static positioning.  Goal status: INITIAL   LONG TERM GOALS: Target date: 05/31/2022   Pt  will become independent with final HEP in order to demonstrate synthesis of PT education.  Goal status: INITIAL  2.  Pt will score >/= 78 on FOTO to demonstrate improvement in perceived bilat knee function.   Goal status: INITIAL  3.  Pt will be able to demonstrate full depth squat without pain in order to demonstrate functional improvement in LE function for self-care and house hold duties. .   Goal status: INITIAL  4.  Pt will be able to demonstrate kneeling to stand and  stand to kneeling transfer without pain in order to demonstrate functional improvement in LE function for ADL/house hold duties.   Goal status: INITIAL    PLAN: PT FREQUENCY: 1-2x/week  PT DURATION: 12 weeks (likely DC in 8)  PLANNED INTERVENTIONS: Therapeutic exercises, Therapeutic activity, Neuromuscular re-education, Balance training, Gait training, Patient/Family education, Self Care, and Joint mobilization  PLAN FOR NEXT SESSION: continue with quad and hip strength, HS curls, LAQ with isometric holds, deeper depth STS   Daleen Bo, PT 03/15/2022, 5:40 PM

## 2022-03-21 ENCOUNTER — Encounter (HOSPITAL_BASED_OUTPATIENT_CLINIC_OR_DEPARTMENT_OTHER): Payer: Self-pay | Admitting: Physical Therapy

## 2022-03-21 ENCOUNTER — Ambulatory Visit (HOSPITAL_BASED_OUTPATIENT_CLINIC_OR_DEPARTMENT_OTHER): Payer: Federal, State, Local not specified - PPO | Admitting: Physical Therapy

## 2022-03-21 DIAGNOSIS — M6281 Muscle weakness (generalized): Secondary | ICD-10-CM | POA: Diagnosis not present

## 2022-03-21 DIAGNOSIS — M25562 Pain in left knee: Secondary | ICD-10-CM

## 2022-03-21 DIAGNOSIS — G8929 Other chronic pain: Secondary | ICD-10-CM | POA: Diagnosis not present

## 2022-03-21 DIAGNOSIS — R262 Difficulty in walking, not elsewhere classified: Secondary | ICD-10-CM | POA: Diagnosis not present

## 2022-03-21 DIAGNOSIS — M25561 Pain in right knee: Secondary | ICD-10-CM | POA: Diagnosis not present

## 2022-03-21 NOTE — Therapy (Signed)
OUTPATIENT PHYSICAL THERAPY LOWER EXTREMITY EVALUATION   Patient Name: Rebecca Cole MRN: [id number] DOB:June 22, 1961, 61 y.o., female Today's Date: 03/21/2022   PT End of Session - 03/21/22 0846     Visit Number 3    Number of Visits 19    Date for PT Re-Evaluation 06/06/22    Authorization Type BCBS    PT Start Time 0803    PT Stop Time 8250    PT Time Calculation (min) 38 min    Activity Tolerance Patient tolerated treatment well;Patient limited by pain    Behavior During Therapy [facility] for tasks assessed/performed              Past Medical History:  Diagnosis Date   Graves Disease Sept 2010   s/p 131 ablation, managed by [place] [facility] Dr. Karle Starch' orbitopathy 2010   managed by [place]   Past Surgical History:  Procedure Laterality Date   BREAST BIOPSY  2006   negative   BREAST CYST EXCISION Right 2006   Patient Active Problem List   Diagnosis Date Noted   Patellofemoral arthritis 02/13/2022   Impaired fasting glucose 02/01/2018   Nonallopathic lesion of cervical region 08/20/2017   Nonallopathic lesion of thoracic region 08/20/2017   Nonallopathic lesion of lumbosacral region 08/20/2017   Pes planus 08/20/2017   Cervical radiculopathy at C5 11/07/2016   Eustachian tube disorder, left 05/08/2016   Vitamin D deficiency 01/04/2014   Routine general medical examination at a health care facility 01/08/2012   Screening for colon cancer 01/08/2012   Graves' orbitopathy    Hypothyroidism, acquired, autoimmune 04/06/2009    PCP: [name], MD  REFERRING PROVIDER: [name], DO  REFERRING DIAG: M25.561,M25.562,G89.29 (ICD-10-CM) - Chronic pain of both knees  THERAPY DIAG:  Pain in joint of right knee  Pain in joint of left knee  Muscle weakness (generalized)  Difficulty walking  Rationale for Evaluation and Treatment Rehabilitation  ONSET DATE: Spring 2022  SUBJECTIVE:   SUBJECTIVE STATEMENT:  Pt  states she is able to sleep without pain at this point. She reports a latreal L knee pain into the HS tendon in standing. Pt states that she is making slow progress but the wall sits still hurt a little.    Eval: Pt states she is here for bilat knee pain with L>R. Pt states this has been issue since she twisted her knee while walking in the dark a few years ago. Imaging shows patellofemoral degeneration. 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 She started squatting and jumping rope around that time too which had caused the pain.  Laying in bed will be dull ache but going down stairs will be feel "almost sharp." Pain is not debilitating but makes her apprehensive. Pt states she has had catching in that L knee. Sitting for too long with knees bent causes pain. Pt states time of day does not affect pain. Pt denies cancer red flags. Pt states she has a history of hypermobility. Knees aching will cause difficulty with sleeping. Denies cancer red flags. Pt does wear orthotics for flat feet.   PERTINENT HISTORY: Pes planus, L knee twisting injury (potential meniscus or L HS)  PAIN:  Are you having pain? Yes: NPRS scale: 0.5/10 Pain location: anterior knee bilaterally, HS tendons bilaterally Pain description: sharp but  mostly dull achey Aggravating factors: stairs, getting off the ground while gardening,  Relieving factors: Diclofinac gel,  ibuprofen  PRECAUTIONS: None  WEIGHT BEARING RESTRICTIONS No  FALLS:  Has patient fallen in last 6 months? 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  Trace effusion noted with chronic changes of the patellofemoral joint as well as findings consistent with a synovitis. Impression: Patellofemoral arthritis with synovitis   IMPRESSION: No significant osteoarthritis of either knee medial or lateral compartment.   PATIENT SURVEYS:  FOTO 72 78 @ DC 8 pts MCII  TODAY'S TREATMENT: Exercises  Upright bike seat 27 L1 5 min  -seated HS curl machine 35lbs 5x5 -SL bridge 2x10 each side  -seated knee ext 3x10 21lbs pain free range 2s hold  -shuttle leg press 2x10 62lbs  - Wall Squat  - 1 x daily - 3-4 x weekly - 2 sets - 10 reps   PATIENT EDUCATION:  Education details:  anatomy/biomechanics of knee, exercise progression, DOMS expectations, envelope of function, HEP, POC  Person educated: Patient Education method: Explanation, Demonstration, Tactile cues, Verbal cues, and Handouts Education comprehension: verbalized understanding, returned demonstration, verbal cues required, and tactile cues required   HOME EXERCISE PROGRAM: Access Code: [email or web address] URL: [email or web address] Date: 03/08/2022 Prepared by: Daleen Bo  ASSESSMENT:  CLINICAL IMPRESSION: Pt able to progress strengthening of the bilateral knee extensors and HS today without increasing pain if not going below 90 deg of loaded flexion. Pt with minor anterior knee aching when below 90 in CKC. No irritation noted in [place] with moderate weight. Pt also able to increase degrees of freedom with glute/hip stability exercise. Plan to continue to progress knee strength as well as work on increasing degrees of flexion with loading as tolerated. Pt would benefit from continued skilled therapy in order to reach goals and maximize functional bilat knee strength for prevention of functional  decline and return to regular home duties and recreation.   OBJECTIVE IMPAIRMENTS decreased mobility, difficulty walking, decreased ROM, decreased strength, increased muscle spasms, improper body mechanics, postural dysfunction, and pain.   ACTIVITY LIMITATIONS lifting, sitting, standing, squatting, stairs, transfers, locomotion level  PARTICIPATION LIMITATIONS: cleaning, laundry, driving, shopping, community activity, occupation, yard work, and exercise  PERSONAL FACTORS Age, Fitness, Time since onset of injury/illness/exacerbation, and 1 comorbidity:    are also affecting patient's functional outcome.   REHAB POTENTIAL: Good  CLINICAL DECISION MAKING: Stable/uncomplicated  EVALUATION COMPLEXITY: Low   GOALS:   SHORT TERM GOALS: Target date: 04/19/2022  Pt will become independent with HEP in order to demonstrate synthesis of PT education..  Goal status: INITIAL  2.  Pt will be able to demonstrate descending stair pattern without dyanmic valgus in order to demonstrate functional improvement in LE strength and house hold mobility.   Goal status: INITIAL  3.  Pt will be able to demonstrate/report ability to sit/stand/sleep for extended periods of time without pain in order to demonstrate functional improvement and tolerance to static positioning.   Goal status: INITIAL   LONG TERM GOALS: Target date: 05/31/2022   Pt  will become  independent with final HEP in order to demonstrate synthesis of PT education.  Goal status: INITIAL  2.  Pt will score >/= 78 on FOTO to demonstrate improvement in perceived bilat knee function.   Goal status: INITIAL  3.  Pt will be able to demonstrate full depth squat without pain in order to demonstrate functional improvement in LE function for self-care and house hold duties. .   Goal status: INITIAL  4.  Pt will be able to demonstrate kneeling to stand and stand to kneeling transfer without pain in order to demonstrate functional improvement  in LE function for ADL/house hold duties.   Goal status: INITIAL    PLAN: PT FREQUENCY: 1-2x/week  PT DURATION: 12 weeks (likely DC in 8)  PLANNED INTERVENTIONS: Therapeutic exercises, Therapeutic activity, Neuromuscular re-education, Balance training, Gait training, Patient/Family education, Self Care, and Joint mobilization  PLAN FOR NEXT SESSION: continue with quad and hip strength,  deeper depth CKC movements, SL stability exercise on pliant surface   Daleen Bo, PT 03/21/2022, 8:46 AM

## 2022-03-22 NOTE — Progress Notes (Deleted)
[place] Phone: [phone] Subjective:    I'm seeing this patient by the request  of:  [name], MD  CC:   [id number]  02/13/2022 Patient does have what appears to be more patellofemoral arthritis bilaterally.  We discussed with patient about icing regimen, home exercises, focusing on the VMO strengthening.  We will hold on anything such as injections with patient having recent cataracts and patient having to be on steroids for a long time.  Patient wants to avoid any injections but given information about viscosupplementation or the possibility of PRP.  Patient will try the conservative therapy first and follow-up again in 6 to 8 weeks.  Patient was willing to try physical therapy and will be sent which I think patient will respond very well.  Update 03/27/2022 Rebecca Cole is a 61 y.o. female coming in with complaint of L knee pain. Patient states       Past Medical History:  Diagnosis Date   Graves Disease Sept 2010   s/p 131 ablation, managed by [facility] [facility] Dr. Karle Starch' orbitopathy 2010   managed by [place]   Past Surgical History:  Procedure Laterality Date   BREAST BIOPSY  2006   negative   BREAST CYST EXCISION Right 2006   Social History   Socioeconomic History   Marital status: Single    Spouse name: Not on file   Number of children: Not on file   Years of education: Not on file   Highest education level: Not on file  Occupational History   Occupation: full-time air traffic controller    Employer: [employer]  Tobacco Use   Smoking status: Never   Smokeless tobacco: Never  Vaping Use   Vaping Use: Never used  Substance and Sexual Activity   Alcohol use: Yes    Comment: 3-4 a month   Drug use: No   Sexual activity: Not Currently    Birth control/protection: Post-menopausal, Abstinence  Other Topics Concern   Not on file  Social  History Narrative   Full-time air traffic controller, has Wellsite geologist.   Has no pets   Social Determinants of Radio broadcast assistant Strain: Not on file  Food Insecurity: Not on file  Transportation Needs: Not on file  Physical Activity: Not on file  Stress: Not on file  Social Connections: Not on file   Allergies  Allergen Reactions   Methimazole    Family History  Problem Relation Age of Onset   Dementia Mother    Heart disease Father        CABG, Ablation for Atrial Flutter, valve replacement, pacemaker, pericardial effusion s/p window   Cancer Father        late 72's history of colon CA   Cancer Maternal Grandmother        possibly benign   Breast cancer Maternal Grandmother     Current Outpatient Medications (Endocrine & Metabolic):    levothyroxine (SYNTHROID) 100 MCG tablet, One tablet before breakfast 5 days per week (and 88 mcg tablet one day per week )   levothyroxine (SYNTHROID) 88 MCG tablet, One tablet weekly (and 1 tablet of 100 mcg daily x 5 days weekly)   Teprotumumab-trbw (TEPEZZA IV), Inject into the vein.  Current Outpatient Medications (Cardiovascular):    EPINEPHRINE 0.3 mg/0.3 mL IJ SOAJ injection, INJECT 0.3 MLS (0.3 MG TOTAL) INTO THE MUSCLE AS NEEDED FOR  ANAPHYLAXIS.  Current Outpatient Medications (Respiratory):    fluticasone (FLONASE) 50 MCG/ACT nasal spray, Place 2 sprays into both nostrils daily.    Current Outpatient Medications (Other):    Cholecalciferol (VITAMIN D3) 1000 UNITS CAPS, Take 1 capsule by mouth daily.   doxycycline (VIBRA-TABS) 100 MG tablet, Take 1 tablet (100 mg total) by mouth 2 (two) times daily.   Estradiol 10 MCG TABS vaginal tablet, Place 1 tablet (10 mcg total) vaginally 2 (two) times a week.   ketoconazole (NIZORAL) 2 % cream, Apply 1 application. topically daily.   triamcinolone cream (KENALOG) 0.1 %, Apply 1 application. topically 2 (two) times daily. As needed for rash   Reviewed prior external  information including notes and imaging from  primary care provider As well as notes that were available from care everywhere and other healthcare systems.  Past medical history, social, surgical and family history all reviewed in electronic medical record.  No pertanent information unless stated regarding to the chief complaint.   Review of Systems:  No headache, visual changes, nausea, vomiting, diarrhea, constipation, dizziness, abdominal pain, skin rash, fevers, chills, night sweats, weight loss, swollen lymph nodes, body aches, joint swelling, chest pain, shortness of breath, mood changes. POSITIVE muscle aches  Objective  Last menstrual period 10/01/2019.   General: No apparent distress alert and oriented x3 mood and affect normal, dressed appropriately.  HEENT: Pupils equal, extraocular movements intact  Respiratory: Patient's speak in full sentences and does not appear short of breath  Cardiovascular: No lower extremity edema, non tender, no erythema      Impression and Recommendations:

## 2022-03-26 ENCOUNTER — Other Ambulatory Visit: Payer: Self-pay | Admitting: Obstetrics & Gynecology

## 2022-03-26 DIAGNOSIS — M9912 Subluxation complex (vertebral) of thoracic region: Secondary | ICD-10-CM | POA: Diagnosis not present

## 2022-03-26 DIAGNOSIS — Z1231 Encounter for screening mammogram for malignant neoplasm of breast: Secondary | ICD-10-CM

## 2022-03-26 DIAGNOSIS — M9913 Subluxation complex (vertebral) of lumbar region: Secondary | ICD-10-CM | POA: Diagnosis not present

## 2022-03-26 DIAGNOSIS — M9906 Segmental and somatic dysfunction of lower extremity: Secondary | ICD-10-CM | POA: Diagnosis not present

## 2022-03-27 ENCOUNTER — Ambulatory Visit: Payer: Federal, State, Local not specified - PPO | Admitting: Family Medicine

## 2022-03-27 ENCOUNTER — Encounter (HOSPITAL_BASED_OUTPATIENT_CLINIC_OR_DEPARTMENT_OTHER): Payer: Self-pay | Admitting: Physical Therapy

## 2022-03-27 ENCOUNTER — Ambulatory Visit
Admission: RE | Admit: 2022-03-27 | Discharge: 2022-03-27 | Disposition: A | Discharge status: Home / Self Care | Payer: Federal, State, Local not specified - PPO | Source: Ambulatory Visit | Attending: Obstetrics & Gynecology | Admitting: Obstetrics & Gynecology

## 2022-03-27 ENCOUNTER — Ambulatory Visit (HOSPITAL_BASED_OUTPATIENT_CLINIC_OR_DEPARTMENT_OTHER): Payer: Federal, State, Local not specified - PPO | Admitting: Physical Therapy

## 2022-03-27 DIAGNOSIS — M25561 Pain in right knee: Secondary | ICD-10-CM | POA: Diagnosis not present

## 2022-03-27 DIAGNOSIS — Z1231 Encounter for screening mammogram for malignant neoplasm of breast: Secondary | ICD-10-CM

## 2022-03-27 DIAGNOSIS — M25562 Pain in left knee: Secondary | ICD-10-CM | POA: Diagnosis not present

## 2022-03-27 DIAGNOSIS — M6281 Muscle weakness (generalized): Secondary | ICD-10-CM

## 2022-03-27 DIAGNOSIS — R262 Difficulty in walking, not elsewhere classified: Secondary | ICD-10-CM | POA: Diagnosis not present

## 2022-03-27 DIAGNOSIS — G8929 Other chronic pain: Secondary | ICD-10-CM | POA: Diagnosis not present

## 2022-03-27 NOTE — Therapy (Signed)
OUTPATIENT PHYSICAL THERAPY LOWER EXTREMITY TREATMENT   Patient Name: Rebecca Cole MRN: [id number] DOB:09-23-1960, 61 y.o., female Today's Date: 03/27/2022   PT End of Session - 03/27/22 1344     Visit Number 4    Number of Visits 19    Date for PT Re-Evaluation 06/06/22    Authorization Type BC[employer]    PT Start Time 1301    PT Stop Time 4098    PT Time Calculation (min) 40 min    Activity Tolerance Patient tolerated treatment well;Patient limited by pain    Behavior During Therapy [facility] for tasks assessed/performed               Past Medical History:  Diagnosis Date   Graves Disease Sept 2010   s/p 131 ablation, managed by [facility] Dr. Karle Starch' orbitopathy 2010   managed by [facility]   Past Surgical History:  Procedure Laterality Date   BREAST BIOPSY  2006   negative   BREAST CYST EXCISION Right 2006   Patient Active Problem List   Diagnosis Date Noted   Patellofemoral arthritis 02/13/2022   Impaired fasting glucose 02/01/2018   Nonallopathic lesion of cervical region 08/20/2017   Nonallopathic lesion of thoracic region 08/20/2017   Nonallopathic lesion of lumbosacral region 08/20/2017   Pes planus 08/20/2017   Cervical radiculopathy at C5 11/07/2016   Eustachian tube disorder, left 05/08/2016   Vitamin D deficiency 01/04/2014   Routine general medical examination at a health care facility 01/08/2012   Screening for colon cancer 01/08/2012   Graves' orbitopathy    Hypothyroidism, acquired, autoimmune 04/06/2009    PCP: [name], MD  REFERRING PROVIDER: [name], DO  REFERRING DIAG: M25.561,M25.562,G89.29 (ICD-10-CM) - Chronic pain of both knees  THERAPY DIAG:  Pain in joint of right knee  Pain in joint of left knee  Muscle weakness (generalized)  Difficulty walking  Rationale for Evaluation and Treatment Rehabilitation  ONSET DATE: Spring 2022  SUBJECTIVE:   SUBJECTIVE STATEMENT:  Pt  states that the knees felt fine after last time   Eval: Pt states she is here for bilat knee pain with L>R. Pt states this has been issue since she twisted her knee while walking in the dark a few years ago. Imaging shows patellofemoral degeneration. 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 She started squatting and jumping rope around that time too which had caused the pain.  Laying in bed will be dull ache but going down stairs will be feel "almost sharp." Pain is not debilitating but makes her apprehensive. Pt states she has had catching in that L knee. Sitting for too long with knees bent causes pain. Pt states time of day does not affect pain. Pt denies cancer red flags. Pt states she has a history of hypermobility. Knees aching will cause difficulty with sleeping. Denies cancer red flags. Pt does wear orthotics for flat feet.   PERTINENT HISTORY: Pes planus, L knee twisting injury (potential meniscus or L HS)  PAIN:  Are you having pain? Yes: NPRS scale: 0.5/10 Pain location: anterior knee bilaterally, HS tendons bilaterally Pain description: sharp but mostly dull achey Aggravating factors: stairs, getting off the ground while gardening,  Relieving factors: Diclofinac gel,  ibuprofen  PRECAUTIONS: None  WEIGHT BEARING RESTRICTIONS No  FALLS:  Has  patient fallen in last 6 months? 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  Trace effusion noted with chronic changes of the patellofemoral joint as well as findings consistent with a synovitis. Impression: Patellofemoral arthritis with synovitis   IMPRESSION: No significant osteoarthritis of either knee medial or lateral compartment.   PATIENT SURVEYS:  FOTO 72 78 @ DC 8 pts MCII   TTP of bilat quad tendon, signficant pes planus   TODAY'S TREATMENT: Exercises  Upright bike seat 27 L1 5 min  -standing calf strech off step 30s 3x -heel raise off step 2x20 -SL bridge 2x10 each side  SL lunge onto Bosu 2x10 -seated knee ext 3x10 25lbs pain free range 2s hold  -TRX squat holds pain free depth 2x10 3s hold   PATIENT EDUCATION:  Education details:  anatomy/biomechanics of knee, exercise progression, DOMS expectations, envelope of function, HEP, POC  Person educated: Patient Education method: Explanation, Demonstration, Tactile cues, Verbal cues, and Handouts Education comprehension: verbalized understanding, returned demonstration, verbal cues required, and tactile cues required   HOME EXERCISE PROGRAM: Access Code: [email or web address] URL: [email or web address] Date: 03/08/2022 Prepared by: Daleen Bo  ASSESSMENT:  CLINICAL IMPRESSION: Pt able to progress CKC and SL loading of bilat knees today without increasing pain. Pt does not increase in R quad tendon and L patellar tendon tensile force with CKC exercise at today's session. Pt continues to progress well with isometrics and knee extensor strength. Of note, pt with significant pes planus and ankle joint hypomobility that is likely contributing to squat pattern deficits. Plan to continue with SL loading and motor control as tolerated. Consider eccentric knee extension machine at next. Pt would benefit from continued skilled therapy in order to reach goals and maximize functional bilat knee strength for prevention of functional decline and return to regular home  duties and recreation.   OBJECTIVE IMPAIRMENTS decreased mobility, difficulty walking, decreased ROM, decreased strength, increased muscle spasms, improper body mechanics, postural dysfunction, and pain.   ACTIVITY LIMITATIONS lifting, sitting, standing, squatting, stairs, transfers, locomotion level  PARTICIPATION LIMITATIONS: cleaning, laundry, driving, shopping, community activity, occupation, yard work, and exercise  PERSONAL FACTORS Age, Fitness, Time since onset of injury/illness/exacerbation, and 1 comorbidity:    are also affecting patient's functional outcome.   REHAB POTENTIAL: Good  CLINICAL DECISION MAKING: Stable/uncomplicated  EVALUATION COMPLEXITY: Low   GOALS:   SHORT TERM GOALS: Target date: 04/19/2022  Pt will become independent with HEP in order to demonstrate synthesis of PT education..  Goal status: INITIAL  2.  Pt will be able to demonstrate descending stair pattern without dyanmic valgus in order to demonstrate functional improvement in LE strength and house hold mobility.   Goal status: INITIAL  3.  Pt will be able to demonstrate/report ability to sit/stand/sleep for extended periods of time without pain in order to demonstrate functional improvement and tolerance to static positioning.   Goal status: INITIAL   LONG TERM GOALS: Target date: 05/31/2022   Pt  will become independent with final HEP in order to demonstrate synthesis of PT education.  Goal status: INITIAL  2.  Pt will score >/= 78 on FOTO to demonstrate improvement in perceived bilat knee function.   Goal status: INITIAL  3.  Pt will be able to demonstrate full depth squat without pain in order to demonstrate functional improvement in LE function for self-care and house hold duties. .   Goal status: INITIAL  4.  Pt will be able to demonstrate kneeling to stand and stand to kneeling transfer without pain in order to demonstrate functional improvement in LE function for ADL/house hold  duties.   Goal status: INITIAL    PLAN: PT FREQUENCY: 1-2x/week  PT DURATION: 12 weeks (likely DC in 8)  PLANNED INTERVENTIONS: Therapeutic exercises, Therapeutic activity, Neuromuscular re-education, Balance training, Gait training, Patient/Family education, Self Care, and Joint mobilization  PLAN FOR NEXT SESSION: continue with quad and hip strength,  deeper depth CKC movements, SL stability exercise on pliant surface   Daleen Bo, PT 03/27/2022, 1:45 PM

## 2022-03-28 DIAGNOSIS — H25013 Cortical age-related cataract, bilateral: Secondary | ICD-10-CM | POA: Diagnosis not present

## 2022-03-28 DIAGNOSIS — H2513 Age-related nuclear cataract, bilateral: Secondary | ICD-10-CM | POA: Diagnosis not present

## 2022-03-28 DIAGNOSIS — H43393 Other vitreous opacities, bilateral: Secondary | ICD-10-CM | POA: Diagnosis not present

## 2022-03-28 DIAGNOSIS — H25043 Posterior subcapsular polar age-related cataract, bilateral: Secondary | ICD-10-CM | POA: Diagnosis not present

## 2022-03-28 DIAGNOSIS — H2511 Age-related nuclear cataract, right eye: Secondary | ICD-10-CM | POA: Diagnosis not present

## 2022-03-31 ENCOUNTER — Other Ambulatory Visit: Payer: Self-pay | Admitting: Internal Medicine

## 2022-04-04 ENCOUNTER — Encounter (HOSPITAL_BASED_OUTPATIENT_CLINIC_OR_DEPARTMENT_OTHER): Payer: Federal, State, Local not specified - PPO | Admitting: Physical Therapy

## 2022-04-11 ENCOUNTER — Encounter (HOSPITAL_BASED_OUTPATIENT_CLINIC_OR_DEPARTMENT_OTHER): Payer: Self-pay | Admitting: Physical Therapy

## 2022-04-11 ENCOUNTER — Ambulatory Visit (HOSPITAL_BASED_OUTPATIENT_CLINIC_OR_DEPARTMENT_OTHER): Payer: Federal, State, Local not specified - PPO | Attending: Family Medicine | Admitting: Physical Therapy

## 2022-04-11 DIAGNOSIS — M25562 Pain in left knee: Secondary | ICD-10-CM | POA: Insufficient documentation

## 2022-04-11 DIAGNOSIS — M6281 Muscle weakness (generalized): Secondary | ICD-10-CM | POA: Insufficient documentation

## 2022-04-11 DIAGNOSIS — M25561 Pain in right knee: Secondary | ICD-10-CM | POA: Insufficient documentation

## 2022-04-11 DIAGNOSIS — R262 Difficulty in walking, not elsewhere classified: Secondary | ICD-10-CM | POA: Diagnosis not present

## 2022-04-11 NOTE — Therapy (Signed)
OUTPATIENT PHYSICAL THERAPY LOWER EXTREMITY TREATMENT   Patient Name: Rebecca Cole MRN: [id number] DOB:16-Dec-1960, 61 y.o., female Today's Date: 04/11/2022   PT End of Session - 04/11/22 1432     Visit Number 5    Number of Visits 19    Date for PT Re-Evaluation 06/06/22    Authorization Type [employer]    PT Start Time 1301    PT Stop Time 4010    PT Time Calculation (min) 42 min    Activity Tolerance Patient tolerated treatment well;Patient limited by pain    Behavior During Therapy [facility] for tasks assessed/performed                Past Medical History:  Diagnosis Date   Graves Disease Sept 2010   s/p 131 ablation, managed by [facility] [facility] Dr. Karle Starch' orbitopathy 2010   managed by [facility]   Past Surgical History:  Procedure Laterality Date   BREAST BIOPSY  2006   negative   BREAST CYST EXCISION Right 2006   Patient Active Problem List   Diagnosis Date Noted   Patellofemoral arthritis 02/13/2022   Impaired fasting glucose 02/01/2018   Nonallopathic lesion of cervical region 08/20/2017   Nonallopathic lesion of thoracic region 08/20/2017   Nonallopathic lesion of lumbosacral region 08/20/2017   Pes planus 08/20/2017   Cervical radiculopathy at C5 11/07/2016   Eustachian tube disorder, left 05/08/2016   Vitamin D deficiency 01/04/2014   Routine general medical examination at a health care facility 01/08/2012   Screening for colon cancer 01/08/2012   Graves' orbitopathy    Hypothyroidism, acquired, autoimmune 04/06/2009    PCP: [name], MD  REFERRING PROVIDER: [name], DO  REFERRING DIAG: M25.561,M25.562,G89.29 (ICD-10-CM) - Chronic pain of both knees  THERAPY DIAG:  Pain in joint of right knee  Pain in joint of left knee  Muscle weakness (generalized)  Difficulty walking  Rationale for Evaluation and Treatment Rehabilitation  ONSET DATE: Spring 2022  SUBJECTIVE:   SUBJECTIVE STATEMENT:  Pt  states that the knees felt fine after last time   Eval: Pt states she is here for bilat knee pain with L>R. Pt states this has been issue since she twisted her knee while walking in the dark a few years ago. Imaging shows patellofemoral degeneration. 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 She started squatting and jumping rope around that time too which had caused the pain.  Laying in bed will be dull ache but going down stairs will be feel "almost sharp." Pain is not debilitating but makes her apprehensive. Pt states she has had catching in that L knee. Sitting for too long with knees bent causes pain. Pt states time of day does not affect pain. Pt denies cancer red flags. Pt states she has a history of hypermobility. Knees aching will cause difficulty with sleeping. Denies cancer red flags. Pt does wear orthotics for flat feet.   PERTINENT HISTORY: Pes planus, L knee twisting injury (potential meniscus or L HS)  PAIN:  Are you having pain? Yes: NPRS scale: 0.5/10 Pain location: anterior knee bilaterally, HS tendons bilaterally Pain description: sharp but mostly dull achey Aggravating factors: stairs, getting off the ground while gardening,  Relieving factors: Diclofinac gel, ibuprofen  PRECAUTIONS: None  WEIGHT BEARING RESTRICTIONS No  FALLS:  Has  patient fallen in last 6 months? 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  Trace effusion noted with chronic changes of the patellofemoral joint as well as findings consistent with a synovitis. Impression: Patellofemoral arthritis with synovitis   IMPRESSION: No significant osteoarthritis of either knee medial or lateral compartment.   PATIENT SURVEYS:  FOTO 72 78 @ DC 8 pts MCII   TTP of bilat quad tendon, signficant pes planus   TODAY'S TREATMENT:  STM L VL- demo of self massage as well Prone quad foam roll 2 min  Exercises  Kneeling squat 3x10 Bear dog hold 2x10 SL knee extension up 2 down1 10x each leg 10 lbs   PATIENT EDUCATION:  Education details:  anatomy/biomechanics of knee, exercise progression, DOMS expectations, envelope of function, HEP, POC  Person educated: Patient Education method: Explanation, Demonstration, Tactile cues, Verbal cues, and Handouts Education comprehension: verbalized understanding, returned demonstration, verbal cues required, and tactile cues required   HOME EXERCISE PROGRAM: Access Code: [email or web address] URL: [email or web address] Date: 03/08/2022 Prepared by: Daleen Bo  ASSESSMENT:  CLINICAL IMPRESSION: Pt with report of increased pained following last session due to lunging and SL activity. Session focused today on progress SL isometric tolerance as well as giving edu about self STM and pain management techniques. Pt's pain appears consistent with overuse related pain. HEP updated this time to reduce spike in knee loading and help to improve knee extension loading tolerance. Plan to progress knee isometrics and eccentrics as able at next. Pt to have upcoming surgery- pt to ask surgeon about exercise precuations. Pt would benefit from continued skilled therapy in order to reach goals and maximize functional bilat knee strength for prevention of functional decline and return to regular home duties and recreation.   OBJECTIVE IMPAIRMENTS decreased mobility, difficulty  walking, decreased ROM, decreased strength, increased muscle spasms, improper body mechanics, postural dysfunction, and pain.   ACTIVITY LIMITATIONS lifting, sitting, standing, squatting, stairs, transfers, locomotion level  PARTICIPATION LIMITATIONS: cleaning, laundry, driving, shopping, community activity, occupation, yard work, and exercise  PERSONAL FACTORS Age, Fitness, Time since onset of injury/illness/exacerbation, and 1 comorbidity:    are also affecting patient's functional outcome.   REHAB POTENTIAL: Good  CLINICAL DECISION MAKING: Stable/uncomplicated  EVALUATION COMPLEXITY: Low   GOALS:   SHORT TERM GOALS: Target date: 04/19/2022  Pt will become independent with HEP in order to demonstrate synthesis of PT education..  Goal status: INITIAL  2.  Pt will be able to demonstrate descending stair pattern without dyanmic valgus in order to demonstrate functional improvement in LE strength and house hold mobility.   Goal status: INITIAL  3.  Pt will be able to demonstrate/report ability to sit/stand/sleep for extended periods of time without pain in order to demonstrate functional improvement and tolerance to static positioning.   Goal status: INITIAL   LONG TERM GOALS: Target date: 05/31/2022   Pt  will become independent with final HEP in order to demonstrate synthesis of PT education.  Goal status: INITIAL  2.  Pt will score >/= 78 on FOTO to demonstrate improvement in perceived bilat  knee function.   Goal status: INITIAL  3.  Pt will be able to demonstrate full depth squat without pain in order to demonstrate functional improvement in LE function for self-care and house hold duties. .   Goal status: INITIAL  4.  Pt will be able to demonstrate kneeling to stand and stand to kneeling transfer without pain in order to demonstrate functional improvement in LE function for ADL/house hold duties.   Goal status: INITIAL    PLAN: PT FREQUENCY: 1-2x/week  PT  DURATION: 12 weeks (likely DC in 8)  PLANNED INTERVENTIONS: Therapeutic exercises, Therapeutic activity, Neuromuscular re-education, Balance training, Gait training, Patient/Family education, Self Care, and Joint mobilization  PLAN FOR NEXT SESSION: continue with quad and hip strength,  deeper depth CKC movements, SL stability exercise on pliant surface   Daleen Bo, PT 04/11/2022, 2:42 PM

## 2022-04-18 ENCOUNTER — Encounter (HOSPITAL_BASED_OUTPATIENT_CLINIC_OR_DEPARTMENT_OTHER): Payer: Self-pay | Admitting: Physical Therapy

## 2022-04-18 ENCOUNTER — Ambulatory Visit (HOSPITAL_BASED_OUTPATIENT_CLINIC_OR_DEPARTMENT_OTHER): Payer: Federal, State, Local not specified - PPO | Admitting: Physical Therapy

## 2022-04-18 DIAGNOSIS — R262 Difficulty in walking, not elsewhere classified: Secondary | ICD-10-CM | POA: Diagnosis not present

## 2022-04-18 DIAGNOSIS — M6281 Muscle weakness (generalized): Secondary | ICD-10-CM

## 2022-04-18 DIAGNOSIS — M25562 Pain in left knee: Secondary | ICD-10-CM | POA: Diagnosis not present

## 2022-04-18 DIAGNOSIS — M25561 Pain in right knee: Secondary | ICD-10-CM | POA: Diagnosis not present

## 2022-04-18 NOTE — Therapy (Signed)
OUTPATIENT PHYSICAL THERAPY LOWER EXTREMITY TREATMENT   Patient Name: Rebecca Cole MRN: [id number] DOB:31-Jul-1961, 61 y.o., female Today's Date: 04/18/2022   PT End of Session - 04/18/22 1015     Visit Number 6    Number of Visits 19    Date for PT Re-Evaluation 06/06/22    Authorization Type BCBS    PT Start Time [phone]    PT Stop Time 1010    PT Time Calculation (min) 39 min    Activity Tolerance Patient tolerated treatment well;Patient limited by pain    Behavior During Therapy [facility] for tasks assessed/performed                 Past Medical History:  Diagnosis Date   Graves Disease Sept 2010   s/p 131 ablation, managed by [place] [facility] Dr. Karle Starch' orbitopathy 2010   managed by [place]   Past Surgical History:  Procedure Laterality Date   BREAST BIOPSY  2006   negative   BREAST CYST EXCISION Right 2006   Patient Active Problem List   Diagnosis Date Noted   Patellofemoral arthritis 02/13/2022   Impaired fasting glucose 02/01/2018   Nonallopathic lesion of cervical region 08/20/2017   Nonallopathic lesion of thoracic region 08/20/2017   Nonallopathic lesion of lumbosacral region 08/20/2017   Pes planus 08/20/2017   Cervical radiculopathy at C5 11/07/2016   Eustachian tube disorder, left 05/08/2016   Vitamin D deficiency 01/04/2014   Routine general medical examination at a health care facility 01/08/2012   Screening for colon cancer 01/08/2012   Graves' orbitopathy    Hypothyroidism, acquired, autoimmune 04/06/2009    PCP: [name], MD  REFERRING PROVIDER: [name], DO  REFERRING DIAG: M25.561,M25.562,G89.29 (ICD-10-CM) - Chronic pain of both knees  THERAPY DIAG:  Pain in joint of right knee  Pain in joint of left knee  Muscle weakness (generalized)  Difficulty walking  Rationale for Evaluation and Treatment Rehabilitation  ONSET DATE: Spring 2022  SUBJECTIVE:   SUBJECTIVE  STATEMENT:  Pt states that the STM helped with the catching in the knee. Pt states the pain is occurring still in the joints.   CataractSurgery 10/10, 10/17    Eval: Pt states she is here for bilat knee pain with L>R. Pt states this has been issue since she twisted her knee while walking in the dark a few years ago. Imaging shows patellofemoral degeneration. 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 She started squatting and jumping rope around that time too which had caused the pain.  Laying in bed will be dull ache but going down stairs will be feel "almost sharp." Pain is not debilitating but makes her apprehensive. Pt states she has had catching in that L knee. Sitting for too long with knees bent causes pain. Pt states time of day does not affect pain. Pt denies cancer red flags. Pt states she has a history of hypermobility. Knees aching will cause difficulty with sleeping. Denies cancer red flags. Pt does wear orthotics for flat feet.   PERTINENT HISTORY: Pes planus, L knee twisting injury (potential meniscus or L HS)  PAIN:  Are you having pain? Yes: NPRS scale: 0.5/10 Pain location: anterior knee bilaterally, HS tendons bilaterally Pain description: sharp but mostly dull achey Aggravating factors: stairs, getting off the ground while  gardening,  Relieving factors: Diclofinac gel, ibuprofen  PRECAUTIONS: None  WEIGHT BEARING RESTRICTIONS No  FALLS:  Has patient fallen in last 6 months? 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  Trace effusion noted with chronic changes of the patellofemoral joint as well as findings consistent with a synovitis. Impression: Patellofemoral arthritis with synovitis   IMPRESSION: No significant osteoarthritis of either knee medial or lateral compartment.   PATIENT SURVEYS:  FOTO 72 78 @ DC 8 pts MCII   TTP of bilat quad tendon, signficant pes planus   TODAY'S TREATMENT:  9/13 Upright bike seat 20 5 min  Bear dog holds 2x10 2s hold Kneeling squat 2x10 10lbs LAQ 3x10 each Parallel depth squat holding onto bar 2x10 SLS at bar on bosu- intermittent UE support 30s 2x each   Previous:   STM L VL- demo of self massage as well Prone quad foam roll 2 min  Exercises  Kneeling squat 3x10 Bear dog hold 2x10 SL knee extension up 2 down1 10x each leg 10 lbs   PATIENT EDUCATION:  Education details:  anatomy/biomechanics of knee, exercise progression, swelling/inflammation management, HEP, POC  Person educated: Patient Education method: Explanation, Demonstration, Tactile cues, Verbal cues, and Handouts Education comprehension: verbalized understanding, returned demonstration, verbal cues required, and tactile cues required   HOME EXERCISE PROGRAM: Access Code: [email or web address] URL: [email or web address] Date: 03/08/2022 Prepared by: Daleen Bo  ASSESSMENT:  CLINICAL IMPRESSION: Pt able to progress knee strengthening exercise at today's session with pain only at deeper degrees of knee flexion. Crepitus noted PFJ with deep squatting likely due to underlying OA. Pt with minimal discomfort after session. Pt advised on inflammation management as well on reducing freq of HEP to 2x/week in order to reduce increase in pain between sessions. Pt to have cataract surgery and will return following. Pt is progressing well with  knee strength but is still pain limited at this time. Pt reports pain is still largely on stairs. Pt would benefit from continued skilled therapy in order to reach goals and maximize functional bilat knee strength for prevention of functional decline and return to regular home duties and recreation.   OBJECTIVE IMPAIRMENTS decreased mobility, difficulty walking, decreased ROM, decreased strength, increased muscle spasms, improper body mechanics, postural dysfunction, and pain.   ACTIVITY LIMITATIONS lifting, sitting, standing, squatting, stairs, transfers, locomotion level  PARTICIPATION LIMITATIONS: cleaning, laundry, driving, shopping, community activity, occupation, yard work, and exercise  PERSONAL FACTORS Age, Fitness, Time since onset of injury/illness/exacerbation, and 1 comorbidity:    are also affecting patient's functional outcome.   REHAB POTENTIAL: Good  CLINICAL DECISION MAKING: Stable/uncomplicated  EVALUATION COMPLEXITY: Low   GOALS:   SHORT TERM GOALS: Target date: 04/19/2022  Pt will become independent with HEP in order to demonstrate synthesis of PT education..  Goal status: INITIAL  2.  Pt will be able to demonstrate descending stair pattern without dyanmic valgus in order to demonstrate functional improvement in LE strength and house hold mobility.   Goal status: INITIAL  3.  Pt will be able to demonstrate/report ability to sit/stand/sleep for extended  periods of time without pain in order to demonstrate functional improvement and tolerance to static positioning.   Goal status: INITIAL   LONG TERM GOALS: Target date: 05/31/2022   Pt  will become independent with final HEP in order to demonstrate synthesis of PT education.  Goal status: INITIAL  2.  Pt will score >/= 78 on FOTO to demonstrate improvement in perceived bilat knee function.   Goal status: INITIAL  3.  Pt will be able to demonstrate full depth squat without pain in order to demonstrate  functional improvement in LE function for self-care and house hold duties. .   Goal status: INITIAL  4.  Pt will be able to demonstrate kneeling to stand and stand to kneeling transfer without pain in order to demonstrate functional improvement in LE function for ADL/house hold duties.   Goal status: INITIAL    PLAN: PT FREQUENCY: 1-2x/week  PT DURATION: 12 weeks (likely DC in 8)  PLANNED INTERVENTIONS: Therapeutic exercises, Therapeutic activity, Neuromuscular re-education, Balance training, Gait training, Patient/Family education, Self Care, and Joint mobilization  PLAN FOR NEXT SESSION: continue with quad and hip strength,  deeper depth CKC movements, SL stability exercise on pliant surface   Daleen Bo, PT 04/18/2022, 10:16 AM

## 2022-04-19 DIAGNOSIS — M9913 Subluxation complex (vertebral) of lumbar region: Secondary | ICD-10-CM | POA: Diagnosis not present

## 2022-04-19 DIAGNOSIS — M9912 Subluxation complex (vertebral) of thoracic region: Secondary | ICD-10-CM | POA: Diagnosis not present

## 2022-04-19 DIAGNOSIS — M9906 Segmental and somatic dysfunction of lower extremity: Secondary | ICD-10-CM | POA: Diagnosis not present

## 2022-04-26 NOTE — Progress Notes (Signed)
Rebecca Cole [place] Phone: [phone] Subjective:   Rebecca Cole, am serving as a scribe for Dr. Hulan Saas.  I'm seeing this patient by the request  of:  [name], MD  CC: Bilateral knee pain  CXK:[id number]  02/13/2022 Patient does have what appears to be more patellofemoral arthritis bilaterally.  We discussed with patient about icing regimen, home exercises, focusing on the VMO strengthening.  We will hold on anything such as injections with patient having recent cataracts and patient having to be on steroids for a long time.  Patient wants to avoid any injections but given information about viscosupplementation or the possibility of PRP.  Patient will try the conservative therapy first and follow-up again in 6 to 8 weeks.  Patient was willing to try physical therapy and will be sent which I think patient will respond very well.  Update 04/30/2022 Rebecca Cole is a 61 y.o. female coming in with complaint of L knee pain. Patient has been doing PT. Patient states that her pain is the same as last visit. PT made her pain worse. Patient notes tender spot surrounding her patella. Notes a catching in the L knee when driving. R knee also has tender spots.       Past Medical History:  Diagnosis Date   Graves Disease Sept 2010   s/p 131 ablation, managed by [place] [facility] Dr. Karle Starch' orbitopathy 2010   managed by [place]   Past Surgical History:  Procedure Laterality Date   BREAST BIOPSY  2006   negative   BREAST CYST EXCISION Right 2006   Social History   Socioeconomic History   Marital status: Single    Spouse name: Not on file   Number of children: Not on file   Years of education: Not on file   Highest education level: Not on file  Occupational History   Occupation: full-time air traffic controller    Employer: [employer]  Tobacco Use   Smoking status: Never    Smokeless tobacco: Never  Vaping Use   Vaping Use: Never used  Substance and Sexual Activity   Alcohol use: Yes    Comment: 3-4 a month   Drug use: Cole   Sexual activity: Not Currently    Birth control/protection: Post-menopausal, Abstinence  Other Topics Concern   Not on file  Social History Narrative   Full-time air traffic controller, has Wellsite geologist.   Has Cole pets   Social Determinants of Radio broadcast assistant Strain: Not on file  Food Insecurity: Not on file  Transportation Needs: Not on file  Physical Activity: Not on file  Stress: Not on file  Social Connections: Not on file   Allergies  Allergen Reactions   Methimazole    Family History  Problem Relation Age of Onset   Dementia Mother    Heart disease Father        CABG, Ablation for Atrial Flutter, valve replacement, pacemaker, pericardial effusion s/p window   Cancer Father        late 55's history of colon CA   Cancer Maternal Grandmother        possibly benign   Breast cancer Maternal Grandmother     Current Outpatient Medications (Endocrine & Metabolic):    levothyroxine (SYNTHROID) 100 MCG tablet, TAKE 1 TABLET BY MOUTH EVERY DAY BEFORE BREAKFAST   levothyroxine (SYNTHROID) 88 MCG tablet, One tablet weekly (  and 1 tablet of 100 mcg daily x 5 days weekly)   Teprotumumab-trbw (TEPEZZA IV), Inject into the vein.  Current Outpatient Medications (Cardiovascular):    EPINEPHRINE 0.3 mg/0.3 mL IJ SOAJ injection, INJECT 0.3 MLS (0.3 MG TOTAL) INTO THE MUSCLE AS NEEDED FOR ANAPHYLAXIS.  Current Outpatient Medications (Respiratory):    fluticasone (FLONASE) 50 MCG/ACT nasal spray, Place 2 sprays into both nostrils daily.    Current Outpatient Medications (Other):    Cholecalciferol (VITAMIN D3) 1000 UNITS CAPS, Take 1 capsule by mouth daily.   doxycycline (VIBRA-TABS) 100 MG tablet, Take 1 tablet (100 mg total) by mouth 2 (two) times daily.   Estradiol 10 MCG TABS vaginal tablet, Place 1 tablet (10  mcg total) vaginally 2 (two) times a week.   ketoconazole (NIZORAL) 2 % cream, Apply 1 application. topically daily.   triamcinolone cream (KENALOG) 0.1 %, Apply 1 application. topically 2 (two) times daily. As needed for rash   Reviewed prior external information including notes and imaging from  primary care provider As well as notes that were available from care everywhere and other healthcare systems.  Past medical history, social, surgical and family history all reviewed in electronic medical record.  Cole pertanent information unless stated regarding to the chief complaint.   Review of Systems:  Cole headache, visual changes, nausea, vomiting, diarrhea, constipation, dizziness, abdominal pain, skin rash, fevers, chills, night sweats, weight loss, swollen lymph nodes, body aches, chest pain, shortness of breath, mood changes. POSITIVE muscle aches, joint swelling  Objective  Blood pressure 114/82, pulse (!) 57, height '5\' 8"'$  (1.727 m), weight 132 lb (59.9 kg), last menstrual period 10/01/2019, SpO2 99 %.   General: Cole apparent distress alert and oriented x3 mood and affect normal, dressed appropriately.  HEENT: Pupils equal, extraocular movements intact  Respiratory: Patient's speak in full sentences and does not appear short of breath  Cardiovascular: Cole lower extremity edema, non tender, Cole erythema  Knee exams bilaterally does have some tenderness to palpation noted. Patient does have trace effusion noted left greater than right.  Cole pain with any type of movement of the knees to be worse left greater than right again.   Impression and Recommendations:    The above documentation has been reviewed and is accurate and complete [name], DO

## 2022-04-30 ENCOUNTER — Ambulatory Visit (INDEPENDENT_AMBULATORY_CARE_PROVIDER_SITE_OTHER): Payer: Federal, State, Local not specified - PPO | Admitting: Family Medicine

## 2022-04-30 VITALS — BP 114/82 | HR 57 | Ht 68.0 in | Wt 132.0 lb

## 2022-04-30 DIAGNOSIS — M1710 Unilateral primary osteoarthritis, unspecified knee: Secondary | ICD-10-CM | POA: Diagnosis not present

## 2022-04-30 DIAGNOSIS — G8929 Other chronic pain: Secondary | ICD-10-CM | POA: Diagnosis not present

## 2022-04-30 DIAGNOSIS — M25561 Pain in right knee: Secondary | ICD-10-CM

## 2022-04-30 DIAGNOSIS — M25562 Pain in left knee: Secondary | ICD-10-CM | POA: Diagnosis not present

## 2022-04-30 NOTE — Assessment & Plan Note (Addendum)
1 do believe the patient still has more of a patellofemoral arthritis but unfortunately pain seems to be out of proportion to the amount of palpation.  Patient's x-rays did not show anything fairly significant but patient does have effusion bilaterally.  At this point because of this I do feel an MRI is necessary.  We will try the MRI of both knees to further evaluate.  Discussed icing regimen and home exercises.

## 2022-04-30 NOTE — Patient Instructions (Signed)
MRI L knee-[place] We will be in touch

## 2022-05-02 ENCOUNTER — Encounter (HOSPITAL_BASED_OUTPATIENT_CLINIC_OR_DEPARTMENT_OTHER): Payer: Self-pay | Admitting: Physical Therapy

## 2022-05-02 ENCOUNTER — Ambulatory Visit (HOSPITAL_BASED_OUTPATIENT_CLINIC_OR_DEPARTMENT_OTHER): Payer: Federal, State, Local not specified - PPO | Admitting: Physical Therapy

## 2022-05-02 DIAGNOSIS — M25561 Pain in right knee: Secondary | ICD-10-CM | POA: Diagnosis not present

## 2022-05-02 DIAGNOSIS — M6281 Muscle weakness (generalized): Secondary | ICD-10-CM

## 2022-05-02 DIAGNOSIS — R262 Difficulty in walking, not elsewhere classified: Secondary | ICD-10-CM

## 2022-05-02 DIAGNOSIS — M25562 Pain in left knee: Secondary | ICD-10-CM

## 2022-05-02 NOTE — Therapy (Signed)
OUTPATIENT PHYSICAL THERAPY LOWER EXTREMITY TREATMENT   Patient Name: Rebecca Cole MRN: [id number] DOB:1960/09/21, 61 y.o., female Today's Date: 05/02/2022   PT End of Session - 05/02/22 1014     Visit Number 7    Number of Visits 19    Date for PT Re-Evaluation 06/06/22    Authorization Type BCBS    PT Start Time 0933    PT Stop Time 1005    PT Time Calculation (min) 32 min    Activity Tolerance Patient tolerated treatment well;Patient limited by pain    Behavior During Therapy [facility] for tasks assessed/performed                  Past Medical History:  Diagnosis Date   Graves Disease Sept 2010   s/p 131 ablation, managed by [place] [facility] Dr. Karle Starch' orbitopathy 2010   managed by [place]   Past Surgical History:  Procedure Laterality Date   BREAST BIOPSY  2006   negative   BREAST CYST EXCISION Right 2006   Patient Active Problem List   Diagnosis Date Noted   Patellofemoral arthritis 02/13/2022   Impaired fasting glucose 02/01/2018   Nonallopathic lesion of cervical region 08/20/2017   Nonallopathic lesion of thoracic region 08/20/2017   Nonallopathic lesion of lumbosacral region 08/20/2017   Pes planus 08/20/2017   Cervical radiculopathy at C5 11/07/2016   Eustachian tube disorder, left 05/08/2016   Vitamin D deficiency 01/04/2014   Routine general medical examination at a health care facility 01/08/2012   Screening for colon cancer 01/08/2012   Graves' orbitopathy    Hypothyroidism, acquired, autoimmune 04/06/2009    PCP: [name], MD  REFERRING PROVIDER: [name], DO  REFERRING DIAG: M25.561,M25.562,G89.29 (ICD-10-CM) - Chronic pain of both knees  THERAPY DIAG:  Pain in joint of right knee  Pain in joint of left knee  Muscle weakness (generalized)  Difficulty walking  Rationale for Evaluation and Treatment Rehabilitation  ONSET DATE: Spring 2022  SUBJECTIVE:   SUBJECTIVE  STATEMENT:  Pt states that she saw MD and was told to hold until MRI is done. Pt states that ADL are still bothering her.   Cataract Surgery 10/10, 10/17    Eval: Pt states she is here for bilat knee pain with L>R. Pt states this has been issue since she twisted her knee while walking in the dark a few years ago. Imaging shows patellofemoral degeneration. 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 She started squatting and jumping rope around that time too which had caused the pain.  Laying in bed will be dull ache but going down stairs will be feel "almost sharp." Pain is not debilitating but makes her apprehensive. Pt states she has had catching in that L knee. Sitting for too long with knees bent causes pain. Pt states time of day does not affect pain. Pt denies cancer red flags. Pt states she has a history of hypermobility. Knees aching will cause difficulty with sleeping. Denies cancer red flags. Pt does wear orthotics for flat feet.   PERTINENT HISTORY: Pes planus, L knee twisting injury (potential meniscus or L HS)  PAIN:  Are you having pain? Yes: NPRS scale: 0.5/10 Pain location: anterior knee bilaterally, HS tendons bilaterally Pain description: sharp but mostly dull achey Aggravating factors: stairs, getting off  the ground while gardening,  Relieving factors: Diclofinac gel, ibuprofen  PRECAUTIONS: None  WEIGHT BEARING RESTRICTIONS No  FALLS:  Has patient fallen in last 6 months? 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  Trace effusion noted with chronic changes of the patellofemoral joint as well as findings consistent with a synovitis. Impression: Patellofemoral arthritis with synovitis   IMPRESSION: No significant osteoarthritis of either knee medial or lateral compartment.   PATIENT SURVEYS:  FOTO 72 78 @ DC 8 pts MCII   TTP of bilat quad tendon, signficant pes planus   TODAY'S TREATMENT:  9/27   Exercises - Side Stepping with Resistance at Thighs  - 1 x daily - 2 x weekly - 2 sets - 10 reps - Standing Bilateral Heel Raise on Step  - 1 x daily - 2 x weekly - 2 sets - 20 reps - Primal Push Up  - 1 x daily - 2 x weekly - 3 sets - 10 reps - 3 hold - Sitting Knee Extension with Resistance  - 1 x daily - 2 x weekly - 3 sets - 10 reps - Squat with Counter Support  - 1 x daily - 2 x weekly - 3 sets - 10 reps - Bird Dog  - 1 x daily - 2-3 x weekly - 2 sets - 20 reps - Standing Shoulder Row with Anchored Resistance  - 1 x daily - 2-3 x weekly - 3 sets - 10 reps - Rack and [employer] with Kettlebells  - 1 x daily - 2-3 x weekly - 1 sets - 3 reps - 59f hold - Shoulder Overhead Press in Flexion with Dumbbells  - 1 x daily - 2-3 x weekly - 2 sets - 10 reps - Push-Up on Counter  - 1 x daily - 2-3 x weekly - 2 sets - 10 reps  9/13 Upright bike seat 20 5 min  Bear dog holds 2x10 2s hold Kneeling squat 2x10 10lbs LAQ 3x10 each Parallel depth squat holding onto bar 2x10 SLS at bar on bosu- intermittent UE support 30s 2x each   Previous:   STM L VL- demo of self massage as well Prone quad foam roll 2 min  Exercises  Kneeling squat 3x10 Bear dog hold 2x10 SL knee extension up 2 down1 10x each leg 10 lbs   PATIENT EDUCATION:  Education details:  anatomy/biomechanics of knee, exercise progression, swelling/inflammation management, HEP,  POC  Person educated: Patient Education method: Explanation, Demonstration, Tactile cues, Verbal cues, and Handouts Education comprehension: verbalized understanding, returned demonstration, verbal cues required, and tactile cues required   HOME EXERCISE PROGRAM: Access Code: [email or web address]: [email or web address] Date: 03/08/2022 Prepared by: ADaleen Bo ASSESSMENT:  CLINICAL IMPRESSION: Pt to hold on PT at this time for MRI as well as upcoming eye surgeries. Pt session focused on progressing LE strength as well as providing edu about other exercise for lumbopelvic stability during hiatus from therapy. Pt given edu about potential outcomes given imaging results and plan for therapy. Depending on imaging results, pt likely to continue focusing on  strength and functional capacity building in order to improve envelope of function for the knee extensor mechanism. Pt would benefit from continued skilled therapy in order to reach goals and maximize functional bilat knee strength for prevention of functional decline and return to regular home duties and recreation.   OBJECTIVE IMPAIRMENTS decreased mobility, difficulty walking, decreased ROM, decreased strength, increased muscle spasms, improper body mechanics, postural dysfunction, and pain.   ACTIVITY LIMITATIONS lifting, sitting, standing, squatting, stairs, transfers, locomotion level  PARTICIPATION LIMITATIONS: cleaning, laundry, driving, shopping, community activity, occupation, yard work, and exercise  PERSONAL FACTORS Age, Fitness, Time since onset of injury/illness/exacerbation, and 1 comorbidity:    are also affecting patient's functional outcome.   REHAB POTENTIAL: Good  CLINICAL DECISION MAKING: Stable/uncomplicated  EVALUATION COMPLEXITY: Low   GOALS:   SHORT TERM GOALS: Target date: 04/19/2022  Pt will become independent with HEP in order to demonstrate synthesis of PT education..  Goal status: met  2.  Pt  will be able to demonstrate descending stair pattern without dyanmic valgus in order to demonstrate functional improvement in LE strength and house hold mobility.   Goal status: met  3.  Pt will be able to demonstrate/report ability to sit/stand/sleep for extended periods of time without pain in order to demonstrate functional improvement and tolerance to static positioning.   Goal status: met   LONG TERM GOALS: Target date: 05/31/2022   Pt  will become independent with final HEP in order to demonstrate synthesis of PT education.  Goal status: INITIAL  2.  Pt will score >/= 78 on FOTO to demonstrate improvement in perceived bilat knee function.   Goal status: INITIAL  3.  Pt will be able to demonstrate full depth squat without pain in order to demonstrate functional improvement in LE function for self-care and house hold duties. .   Goal status: INITIAL  4.  Pt will be able to demonstrate kneeling to stand and stand to kneeling transfer without pain in order to demonstrate functional improvement in LE function for ADL/house hold duties.   Goal status: INITIAL    PLAN: PT FREQUENCY: 1-2x/week  PT DURATION: 12 weeks (likely DC in 8)  PLANNED INTERVENTIONS: Therapeutic exercises, Therapeutic activity, Neuromuscular re-education, Balance training, Gait training, Patient/Family education, Self Care, and Joint mobilization  PLAN FOR NEXT SESSION: continue with quad and hip strength,  deeper depth CKC movements, SL stability exercise on pliant surface   Daleen Bo, PT 05/02/2022, 10:36 AM

## 2022-05-07 DIAGNOSIS — H903 Sensorineural hearing loss, bilateral: Secondary | ICD-10-CM | POA: Diagnosis not present

## 2022-05-09 ENCOUNTER — Encounter (HOSPITAL_BASED_OUTPATIENT_CLINIC_OR_DEPARTMENT_OTHER): Payer: Federal, State, Local not specified - PPO | Admitting: Physical Therapy

## 2022-05-10 ENCOUNTER — Ambulatory Visit
Admission: RE | Admit: 2022-05-10 | Discharge: 2022-05-10 | Disposition: A | Discharge status: Home / Self Care | Payer: Federal, State, Local not specified - PPO | Source: Ambulatory Visit | Attending: Family Medicine | Admitting: Family Medicine

## 2022-05-10 DIAGNOSIS — M9913 Subluxation complex (vertebral) of lumbar region: Secondary | ICD-10-CM | POA: Diagnosis not present

## 2022-05-10 DIAGNOSIS — M25562 Pain in left knee: Secondary | ICD-10-CM | POA: Diagnosis not present

## 2022-05-10 DIAGNOSIS — G8929 Other chronic pain: Secondary | ICD-10-CM

## 2022-05-10 DIAGNOSIS — M25461 Effusion, right knee: Secondary | ICD-10-CM | POA: Diagnosis not present

## 2022-05-10 DIAGNOSIS — M25561 Pain in right knee: Secondary | ICD-10-CM | POA: Diagnosis not present

## 2022-05-10 DIAGNOSIS — M9912 Subluxation complex (vertebral) of thoracic region: Secondary | ICD-10-CM | POA: Diagnosis not present

## 2022-05-10 DIAGNOSIS — M9906 Segmental and somatic dysfunction of lower extremity: Secondary | ICD-10-CM | POA: Diagnosis not present

## 2022-05-10 DIAGNOSIS — R600 Localized edema: Secondary | ICD-10-CM | POA: Diagnosis not present

## 2022-05-14 ENCOUNTER — Encounter: Payer: Self-pay | Admitting: Family Medicine

## 2022-05-15 DIAGNOSIS — H2512 Age-related nuclear cataract, left eye: Secondary | ICD-10-CM | POA: Diagnosis not present

## 2022-05-15 DIAGNOSIS — H269 Unspecified cataract: Secondary | ICD-10-CM | POA: Diagnosis not present

## 2022-05-15 DIAGNOSIS — H2511 Age-related nuclear cataract, right eye: Secondary | ICD-10-CM | POA: Diagnosis not present

## 2022-05-22 DIAGNOSIS — H2512 Age-related nuclear cataract, left eye: Secondary | ICD-10-CM | POA: Diagnosis not present

## 2022-05-22 DIAGNOSIS — H269 Unspecified cataract: Secondary | ICD-10-CM | POA: Diagnosis not present

## 2022-06-07 DIAGNOSIS — M9913 Subluxation complex (vertebral) of lumbar region: Secondary | ICD-10-CM | POA: Diagnosis not present

## 2022-06-07 DIAGNOSIS — M9912 Subluxation complex (vertebral) of thoracic region: Secondary | ICD-10-CM | POA: Diagnosis not present

## 2022-06-07 DIAGNOSIS — M9906 Segmental and somatic dysfunction of lower extremity: Secondary | ICD-10-CM | POA: Diagnosis not present

## 2022-06-12 ENCOUNTER — Encounter (HOSPITAL_BASED_OUTPATIENT_CLINIC_OR_DEPARTMENT_OTHER): Payer: Self-pay | Admitting: Physical Therapy

## 2022-06-12 ENCOUNTER — Ambulatory Visit (HOSPITAL_BASED_OUTPATIENT_CLINIC_OR_DEPARTMENT_OTHER): Payer: Federal, State, Local not specified - PPO | Attending: Family Medicine | Admitting: Physical Therapy

## 2022-06-12 DIAGNOSIS — M25561 Pain in right knee: Secondary | ICD-10-CM | POA: Diagnosis not present

## 2022-06-12 DIAGNOSIS — R262 Difficulty in walking, not elsewhere classified: Secondary | ICD-10-CM | POA: Diagnosis not present

## 2022-06-12 DIAGNOSIS — M25562 Pain in left knee: Secondary | ICD-10-CM | POA: Insufficient documentation

## 2022-06-12 DIAGNOSIS — M6281 Muscle weakness (generalized): Secondary | ICD-10-CM | POA: Diagnosis not present

## 2022-06-12 NOTE — Therapy (Signed)
OUTPATIENT PHYSICAL THERAPY LOWER EXTREMITY TREATMENT  PHYSICAL THERAPY DISCHARGE SUMMARY  Visits from Start of Care: 8  Plan: Patient agrees to discharge.  Patient goals were partially met. Patient is being discharged due to meeting functional plateau with therapy.        Patient Name: Lawanda Holzheimer MRN: [id number] DOB:[birth year], 61 y.o., female Today's Date: 06/12/2022   PT End of Session - 06/12/22 1139     Visit Number 8    Number of Visits 19    Date for PT Re-Evaluation 06/06/22    Authorization Type BCBS    PT Start Time 0930    PT Stop Time 1010    PT Time Calculation (min) 40 min    Activity Tolerance Patient tolerated treatment well;Patient limited by pain    Behavior During Therapy [place] for tasks assessed/performed                   Past Medical History:  Diagnosis Date   Graves Disease Sept 2010   s/p 131 ablation, managed by [facility] Dr. Karle Starch' orbitopathy 2010   managed by [facility]   Past Surgical History:  Procedure Laterality Date   BREAST BIOPSY  2006   negative   BREAST CYST EXCISION Right 2006   Patient Active Problem List   Diagnosis Date Noted   Patellofemoral arthritis 02/13/2022   Impaired fasting glucose 02/01/2018   Nonallopathic lesion of cervical region 08/20/2017   Nonallopathic lesion of thoracic region 08/20/2017   Nonallopathic lesion of lumbosacral region 08/20/2017   Pes planus 08/20/2017   Cervical radiculopathy at C5 11/07/2016   Eustachian tube disorder, left 05/08/2016   Vitamin D deficiency 01/04/2014   Routine general medical examination at a health care facility 01/08/2012   Screening for colon cancer 01/08/2012   Graves' orbitopathy    Hypothyroidism, acquired, autoimmune 04/06/2009    PCP: [name], MD  REFERRING PROVIDER: [name], DO  REFERRING DIAG: M25.561,M25.562,G89.29 (ICD-10-CM) - Chronic pain of both knees  THERAPY DIAG:  Pain in  joint of right knee  Pain in joint of left knee  Muscle weakness (generalized)  Difficulty walking  Rationale for Evaluation and Treatment Rehabilitation  ONSET DATE: Spring 2022  SUBJECTIVE:   SUBJECTIVE STATEMENT:  Pt states she got her MRI results back and show full cartilage loss at PTF joint. Pt states she can do her current HEP independently and would like to consult with MD about further options. She is concerned about the need for surgery.   Eval: Pt states she is here for bilat knee pain with L>R. Pt states this has been issue since she twisted her knee while walking in the dark a few years ago. Imaging shows patellofemoral degeneration. 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 She started squatting and jumping rope around that time too which had caused the pain.  Laying in bed will be dull ache but going down stairs will be feel "almost sharp." Pain is not debilitating but makes her apprehensive. Pt states she has had catching in that L knee. Sitting for too long with knees bent causes pain. Pt states time of day does not affect pain. Pt denies cancer red flags. Pt states she has a history of hypermobility. Knees aching will cause difficulty with sleeping. Denies cancer red  flags. Pt does wear orthotics for flat feet.   PERTINENT HISTORY: Pes planus, L knee twisting injury (potential meniscus or L HS)  PAIN:  Are you having pain? Yes: NPRS scale: 0.5/10 Pain location: anterior knee bilaterally, HS tendons bilaterally Pain description: sharp but mostly dull achey Aggravating factors: stairs, getting off the ground while gardening,  Relieving factors: Diclofinac gel, ibuprofen  PRECAUTIONS: None  WEIGHT BEARING RESTRICTIONS No  FALLS:  Has patient fallen in last 6 months? 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  Trace effusion noted with chronic changes of the patellofemoral joint as well as findings consistent with a synovitis. Impression: Patellofemoral arthritis with synovitis   IMPRESSION: No significant osteoarthritis of either knee medial or lateral compartment.   IMPRESSION: 1. No meniscal or ligamentous injury of the left knee. 2. Full-thickness cartilage loss of the lateral patellofemoral compartment with subchondral reactive marrow edema in the lateral trochlea. Partial-thickness cartilage loss of the medial patellofemoral compartment. 3. Mild edema in superolateral Hoffa's fat as can be seen with patellar tendon-lateral femoral condyle friction syndrome. 4. Edema in the anterior suprapatellar fat pad as can be seen with anterior suprapatellar fat pad impingement syndrome.  IMPRESSION: 1. No meniscal or ligamentous injury of the right knee. 2. Full-thickness cartilage loss of the lateral patellofemoral compartment with focal subchondral reactive marrow edema in the lateral trochlea. Partial-thickness cartilage loss of the medial patellar compartment. 3. Mild edema in superolateral Hoffa's fat as can be seen with patellar tendon-lateral femoral condyle friction syndrome. 4. Edema in the anterior suprapatellar fat pad as can be seen with anterior suprapatellar fat pad impingement syndrome.   PATIENT SURVEYS:  FOTO 72 78 @ DC 8 pts MCII  LOWER EXTREMITY ROM:   Active ROM Right 11/7 Left 11/7  Hip flexion WNL WNL  Hip  extension WNL WNL  Hip abduction WNL WNL  Knee flexion WNL WNL p! At end range  Knee extension WNL WFL   (Blank rows = not tested)   LOWER EXTREMITY MMT:   MMT Right eval Left eval  Hip flexion 4+/5 4+/5  Hip extension 4+/5 4+/5  Hip abduction 4/5 4/5  Knee flexion 4/5 4/5  Knee extension 4+/5 4+/5   (Blank rows = not tested)      GAIT: Distance walked: 43f Assistive device utilized: None Level of assistance: Complete Independence Comments: WNL, walks with Hokas and custom orthotics on   TODAY'S TREATMENT:  11/7  Edu about HEP/independent strengthening, exercise progression, MD/pharmacist questions to consider, conservative management vs TKA, edema management, exercise tolerance/acceptable levels of pain  9/27   Exercises - Side Stepping with Resistance at Thighs  - 1 x daily - 2 x weekly - 2 sets - 10 reps - Standing Bilateral Heel Raise on Step  - 1 x daily - 2 x weekly - 2 sets - 20 reps - Primal Push Up  - 1 x daily - 2 x weekly - 3 sets - 10 reps - 3 hold - Sitting Knee Extension with Resistance  - 1 x daily - 2 x weekly -  3 sets - 10 reps - Squat with Counter Support  - 1 x daily - 2 x weekly - 3 sets - 10 reps - Bird Dog  - 1 x daily - 2-3 x weekly - 2 sets - 20 reps - Standing Shoulder Row with Anchored Resistance  - 1 x daily - 2-3 x weekly - 3 sets - 10 reps - Rack and [employer] with Kettlebells  - 1 x daily - 2-3 x weekly - 1 sets - 3 reps - 29f hold - Shoulder Overhead Press in Flexion with Dumbbells  - 1 x daily - 2-3 x weekly - 2 sets - 10 reps - Push-Up on Counter  - 1 x daily - 2-3 x weekly - 2 sets - 10 reps  9/13 Upright bike seat 20 5 min  Bear dog holds 2x10 2s hold Kneeling squat 2x10 10lbs LAQ 3x10 each Parallel depth squat holding onto bar 2x10 SLS at bar on bosu- intermittent UE support 30s 2x each   Previous:   STM L VL- demo of self massage as well Prone quad foam roll 2 min  Exercises  Kneeling squat 3x10 Bear dog  hold 2x10 SL knee extension up 2 down1 10x each leg 10 lbs   PATIENT EDUCATION:  Education details:  anatomy/biomechanics of knee, exercise progression, swelling/inflammation management, HEP, POC  Person educated: Patient Education method: Explanation, Demonstration, Tactile cues, Verbal cues, and Handouts Education comprehension: verbalized understanding, returned demonstration, verbal cues required, and tactile cues required   HOME EXERCISE PROGRAM: Access Code: [email or web address]: [email or web address] Date: 03/08/2022 Prepared by: ADaleen Bo ASSESSMENT:  CLINICAL IMPRESSION: Pt with known MRI results of total cartilage loss at bilat PTFJ. Pt returns today from cataract surgery at today's session to discuss further PT management. Given pt's symptom irritability and independence with exercise, pt likely able to D/C at this time for independent exercise in order to strengthen the knee. Full discussion and all questions addressed regarding knee anatomy, further need for medical management, and controllable factors currently in terms of exercise. Pt to trial independent exercise for the next 6-8 wks before returning to MD to consider injection or surgical consultation. D/C this episode of care at this time.    OBJECTIVE IMPAIRMENTS decreased mobility, difficulty walking, decreased ROM, decreased strength, increased muscle spasms, improper body mechanics, postural dysfunction, and pain.   ACTIVITY LIMITATIONS lifting, sitting, standing, squatting, stairs, transfers, locomotion level  PARTICIPATION LIMITATIONS: cleaning, laundry, driving, shopping, community activity, occupation, yard work, and exercise  PERSONAL FACTORS Age, Fitness, Time since onset of injury/illness/exacerbation, and 1 comorbidity:    are also affecting patient's functional outcome.   REHAB POTENTIAL: Good  CLINICAL DECISION MAKING: Stable/uncomplicated  EVALUATION COMPLEXITY: Low   GOALS:   SHORT  TERM GOALS: Target date: 04/19/2022  Pt will become independent with HEP in order to demonstrate synthesis of PT education..  Goal status: met  2.  Pt will be able to demonstrate descending stair pattern without dyanmic valgus in order to demonstrate functional improvement in LE strength and house hold mobility.   Goal status: met  3.  Pt will be able to demonstrate/report ability to sit/stand/sleep for extended periods of time without pain in order to demonstrate functional improvement and tolerance to static positioning.   Goal status: met   LONG TERM GOALS: Target date: 05/31/2022   Pt  will become independent with final HEP in order to demonstrate synthesis of PT education.  Goal status: met  2.  Pt will score >/=  78 on FOTO to demonstrate improvement in perceived bilat knee function.   Goal status: unable to assess  3.  Pt will be able to demonstrate full depth squat without pain in order to demonstrate functional improvement in LE function for self-care and house hold duties. .   Goal status: partially met  4.  Pt will be able to demonstrate kneeling to stand and stand to kneeling transfer without pain in order to demonstrate functional improvement in LE function for ADL/house hold duties.   Goal status: MET    PLAN: PT FREQUENCY: 1-2x/week  PT DURATION: 12 weeks (likely DC in 8)  PLANNED INTERVENTIONS: Therapeutic exercises, Therapeutic activity, Neuromuscular re-education, Balance training, Gait training, Patient/Family education, Self Care, and Joint mobilization    Daleen Bo, PT 06/12/2022, 11:45 AM

## 2022-06-13 ENCOUNTER — Other Ambulatory Visit: Payer: Federal, State, Local not specified - PPO

## 2022-06-19 ENCOUNTER — Encounter (HOSPITAL_BASED_OUTPATIENT_CLINIC_OR_DEPARTMENT_OTHER): Payer: Federal, State, Local not specified - PPO | Admitting: Physical Therapy

## 2022-06-25 DIAGNOSIS — H2512 Age-related nuclear cataract, left eye: Secondary | ICD-10-CM | POA: Diagnosis not present

## 2022-06-26 ENCOUNTER — Encounter (HOSPITAL_BASED_OUTPATIENT_CLINIC_OR_DEPARTMENT_OTHER): Payer: Federal, State, Local not specified - PPO | Admitting: Physical Therapy

## 2022-06-27 ENCOUNTER — Telehealth: Payer: Self-pay | Admitting: Internal Medicine

## 2022-06-27 DIAGNOSIS — E538 Deficiency of other specified B group vitamins: Secondary | ICD-10-CM

## 2022-06-27 NOTE — Telephone Encounter (Signed)
Yes,  I have ordered it .  Tsh and b12 are both ordered

## 2022-06-27 NOTE — Telephone Encounter (Signed)
Pt would like to be called in regards to a B12 check. Pt did not think orders were in for it

## 2022-06-27 NOTE — Telephone Encounter (Signed)
Spoke with pt and she stated that she would like to have her b12 level checked since she has switched back to taking the OTC b12 supplement. She stated that she would like to make sure that she is still absorbing it as well as the injection. Is it okay to order?

## 2022-06-27 NOTE — Telephone Encounter (Signed)
Pt is aware.  

## 2022-07-02 ENCOUNTER — Other Ambulatory Visit (INDEPENDENT_AMBULATORY_CARE_PROVIDER_SITE_OTHER): Payer: Federal, State, Local not specified - PPO

## 2022-07-02 DIAGNOSIS — E538 Deficiency of other specified B group vitamins: Secondary | ICD-10-CM

## 2022-07-02 LAB — VITAMIN B12: Vitamin B-12: 1351 pg/mL — ABNORMAL HIGH (ref 211–911)

## 2022-07-03 ENCOUNTER — Encounter (HOSPITAL_BASED_OUTPATIENT_CLINIC_OR_DEPARTMENT_OTHER): Payer: Federal, State, Local not specified - PPO | Admitting: Physical Therapy

## 2022-07-04 ENCOUNTER — Other Ambulatory Visit: Payer: Self-pay | Admitting: Internal Medicine

## 2022-07-05 ENCOUNTER — Encounter: Payer: Self-pay | Admitting: Internal Medicine

## 2022-07-05 DIAGNOSIS — M9906 Segmental and somatic dysfunction of lower extremity: Secondary | ICD-10-CM | POA: Diagnosis not present

## 2022-07-05 DIAGNOSIS — M9912 Subluxation complex (vertebral) of thoracic region: Secondary | ICD-10-CM | POA: Diagnosis not present

## 2022-07-05 DIAGNOSIS — M9913 Subluxation complex (vertebral) of lumbar region: Secondary | ICD-10-CM | POA: Diagnosis not present

## 2022-07-09 NOTE — Telephone Encounter (Signed)
Was the TSH released on this patient or can it be added?

## 2022-07-12 DIAGNOSIS — Z800 Family history of malignant neoplasm of digestive organs: Secondary | ICD-10-CM | POA: Diagnosis not present

## 2022-07-12 DIAGNOSIS — Z1211 Encounter for screening for malignant neoplasm of colon: Secondary | ICD-10-CM | POA: Diagnosis not present

## 2022-07-12 DIAGNOSIS — K5730 Diverticulosis of large intestine without perforation or abscess without bleeding: Secondary | ICD-10-CM | POA: Diagnosis not present

## 2022-07-12 DIAGNOSIS — E039 Hypothyroidism, unspecified: Secondary | ICD-10-CM | POA: Diagnosis not present

## 2022-07-18 ENCOUNTER — Other Ambulatory Visit (INDEPENDENT_AMBULATORY_CARE_PROVIDER_SITE_OTHER): Payer: Federal, State, Local not specified - PPO

## 2022-07-18 DIAGNOSIS — E063 Autoimmune thyroiditis: Secondary | ICD-10-CM

## 2022-07-18 DIAGNOSIS — H903 Sensorineural hearing loss, bilateral: Secondary | ICD-10-CM | POA: Diagnosis not present

## 2022-07-18 LAB — TSH: TSH: 1.13 u[IU]/mL (ref 0.35–5.50)

## 2022-08-16 DIAGNOSIS — H90A22 Sensorineural hearing loss, unilateral, left ear, with restricted hearing on the contralateral side: Secondary | ICD-10-CM | POA: Diagnosis not present

## 2022-08-16 DIAGNOSIS — H9122 Sudden idiopathic hearing loss, left ear: Secondary | ICD-10-CM | POA: Diagnosis not present

## 2022-08-16 DIAGNOSIS — H9312 Tinnitus, left ear: Secondary | ICD-10-CM | POA: Diagnosis not present

## 2022-08-16 DIAGNOSIS — J301 Allergic rhinitis due to pollen: Secondary | ICD-10-CM | POA: Diagnosis not present

## 2022-08-29 DIAGNOSIS — H26492 Other secondary cataract, left eye: Secondary | ICD-10-CM | POA: Diagnosis not present

## 2022-08-30 DIAGNOSIS — H9312 Tinnitus, left ear: Secondary | ICD-10-CM | POA: Diagnosis not present

## 2022-08-30 DIAGNOSIS — H90A22 Sensorineural hearing loss, unilateral, left ear, with restricted hearing on the contralateral side: Secondary | ICD-10-CM | POA: Diagnosis not present

## 2022-09-26 DIAGNOSIS — Z800 Family history of malignant neoplasm of digestive organs: Secondary | ICD-10-CM | POA: Diagnosis not present

## 2022-09-26 DIAGNOSIS — Z1211 Encounter for screening for malignant neoplasm of colon: Secondary | ICD-10-CM | POA: Diagnosis not present

## 2022-09-26 DIAGNOSIS — K5730 Diverticulosis of large intestine without perforation or abscess without bleeding: Secondary | ICD-10-CM | POA: Diagnosis not present

## 2022-09-26 LAB — HM COLONOSCOPY

## 2022-09-27 DIAGNOSIS — Z1211 Encounter for screening for malignant neoplasm of colon: Secondary | ICD-10-CM

## 2022-09-27 NOTE — Assessment & Plan Note (Signed)
No polyps Feb 2024 colonoscopy.  Continue 5 yr interval screening due to Brule of colon ca

## 2022-10-03 ENCOUNTER — Other Ambulatory Visit: Payer: Self-pay | Admitting: Family

## 2022-10-22 ENCOUNTER — Encounter: Payer: Self-pay | Admitting: Family Medicine

## 2022-10-24 NOTE — Progress Notes (Signed)
Rebecca Cole [place] Phone: [phone] Subjective:   [name], am serving as a scribe for Dr. Hulan Saas.  I'm seeing this patient by the request  of:  Rebecca Mc, MD  CC: bilateral knee pain   [id number]  04/30/2022 1 do believe the patient still has more of a patellofemoral arthritis but unfortunately pain seems to be out of proportion to the amount of palpation.  Patient's x-rays did not show anything fairly significant but patient does have effusion bilaterally.  At this point because of this I do feel an MRI is necessary.  We will try the MRI of both knees to further evaluate.  Discussed icing regimen and home exercises   Updated 10/25/2022 Rebecca Cole is a 62 y.o. female coming in with complaint of B knee pain. Here for PRP patellofemoral tenderness.  Here for PRP      Past Medical History:  Diagnosis Date   Graves Disease Sept 2010   s/p 131 ablation, managed by [facility] Dr. Karle Starch' orbitopathy 2010   managed by [facility]   Past Surgical History:  Procedure Laterality Date   BREAST BIOPSY  2006   negative   BREAST CYST EXCISION Right 2006   Social History   Socioeconomic History   Marital status: Single    Spouse name: Not on file   Number of children: Not on file   Years of education: Not on file   Highest education level: Not on file  Occupational History   Occupation: full-time air traffic controller    Employer: [employer]  Tobacco Use   Smoking status: Never   Smokeless tobacco: Never  Vaping Use   Vaping Use: Never used  Substance and Sexual Activity   Alcohol use: Yes    Comment: 3-4 a month   Drug use: No   Sexual activity: Not Currently    Birth control/protection: Post-menopausal, Abstinence  Other Topics Concern   Not on file  Social History Narrative   Full-time air traffic controller, has Wellsite geologist.   Has no  pets   Social Determinants of Radio broadcast assistant Strain: Not on file  Food Insecurity: Not on file  Transportation Needs: Not on file  Physical Activity: Not on file  Stress: Not on file  Social Connections: Not on file   Allergies  Allergen Reactions   Methimazole    Family History  Problem Relation Age of Onset   Dementia Mother    Heart disease Father        CABG, Ablation for Atrial Flutter, valve replacement, pacemaker, pericardial effusion s/p window   Cancer Father        late 64's history of colon CA   Cancer Maternal Grandmother        possibly benign   Breast cancer Maternal Grandmother     Current Outpatient Medications (Endocrine & Metabolic):    levothyroxine (SYNTHROID) 100 MCG tablet, TAKE 1 TABLET BY MOUTH EVERY DAY BEFORE BREAKFAST   levothyroxine (SYNTHROID) 88 MCG tablet, TAKE ONE TABLET WEEKLY (AND 1 TABLET OF 100 MCG DAILY FOR 5 DAYS WEEKLY)   Teprotumumab-trbw (TEPEZZA IV), Inject into the vein.  Current Outpatient Medications (Cardiovascular):    EPINEPHRINE 0.3 mg/0.3 mL IJ SOAJ injection, INJECT 0.3 MLS (0.3 MG TOTAL) INTO THE MUSCLE AS NEEDED FOR ANAPHYLAXIS.  Current Outpatient Medications (Respiratory):    fluticasone (FLONASE) 50 MCG/ACT nasal spray,  Place 2 sprays into both nostrils daily.    Current Outpatient Medications (Other):    Cholecalciferol (VITAMIN D3) 1000 UNITS CAPS, Take 1 capsule by mouth daily.   doxycycline (VIBRA-TABS) 100 MG tablet, Take 1 tablet (100 mg total) by mouth 2 (two) times daily.   Estradiol 10 MCG TABS vaginal tablet, Place 1 tablet (10 mcg total) vaginally 2 (two) times a week.   ketoconazole (NIZORAL) 2 % cream, Apply 1 application. topically daily.   triamcinolone cream (KENALOG) 0.1 %, Apply 1 application. topically 2 (two) times daily. As needed for rash    Objective  Height 5\' 8"  (1.727 m), weight 136 lb (61.7 kg), last menstrual period 10/01/2019.   General: No apparent distress alert and  oriented x3 mood and affect normal, dressed appropriately.    After informed written and verbal consent, patient was seated on exam table. Right knee was prepped with alcohol swab and utilizing anterolateral approach, patient's right knee space was injected with 2 cc of 0.5% Marcaine and then injected 5 cc of PRP patient tolerated the procedure well without immediate complications.  After informed written and verbal consent, patient was seated on exam table. Left knee was prepped with alcohol swab and utilizing anterolateral approach, patient's left knee space was injected with 2 cc of 0.5% Marcaine and then injected 5 cc of PRP.  patient tolerated the procedure well without immediate complications.    Impression and Recommendations:    The above documentation has been reviewed and is accurate and complete [name], DO

## 2022-10-25 ENCOUNTER — Ambulatory Visit (INDEPENDENT_AMBULATORY_CARE_PROVIDER_SITE_OTHER): Payer: Self-pay | Admitting: Family Medicine

## 2022-10-25 VITALS — BP 106/72 | HR 63 | Ht 68.0 in | Wt 136.0 lb

## 2022-10-25 DIAGNOSIS — M1710 Unilateral primary osteoarthritis, unspecified knee: Secondary | ICD-10-CM

## 2022-10-25 NOTE — Assessment & Plan Note (Signed)
Arthritis noted.  Post PRP injection given  RTC in 6 weeks

## 2022-10-25 NOTE — Patient Instructions (Signed)
No ice or IBU for 3 days Heat and Tylenol are ok See me again in 7-8 weeks  

## 2022-11-25 IMAGING — MG MM DIGITAL SCREENING BILAT W/ TOMO AND CAD
8 series · 9 of 24 positions shown · non-contrast
Comparison: Previous exam(s).

CLINICAL DATA: Screening.

EXAM:
DIGITAL SCREENING BILATERAL MAMMOGRAM WITH TOMOSYNTHESIS AND CAD
TECHNIQUE: Bilateral screening digital craniocaudal and mediolateral oblique
mammograms were obtained. Bilateral screening digital breast
tomosynthesis was performed. The images were evaluated with
computer-aided detection.

[R MLO synth-2D]
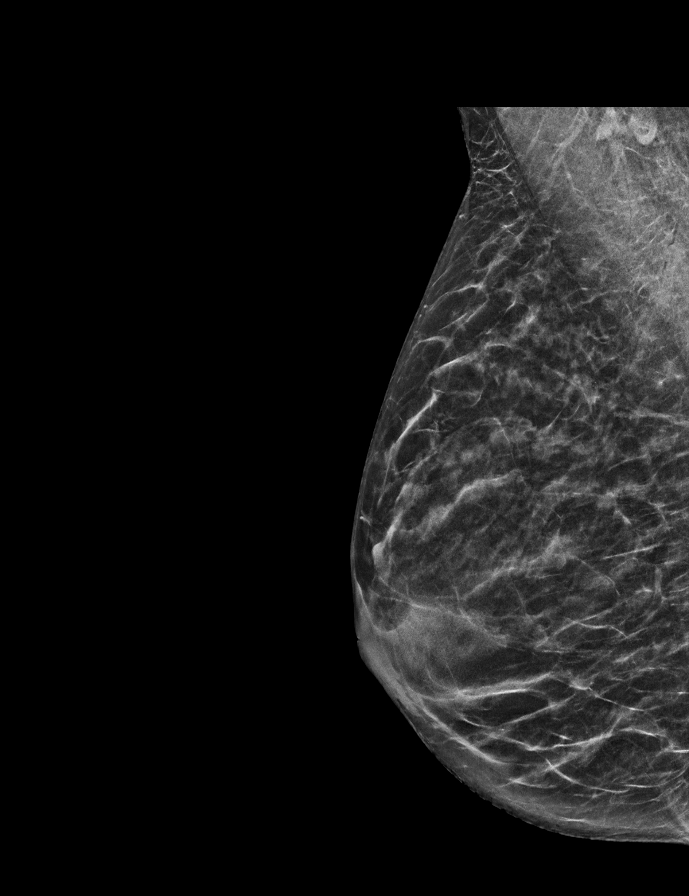

[L CC synth-2D]
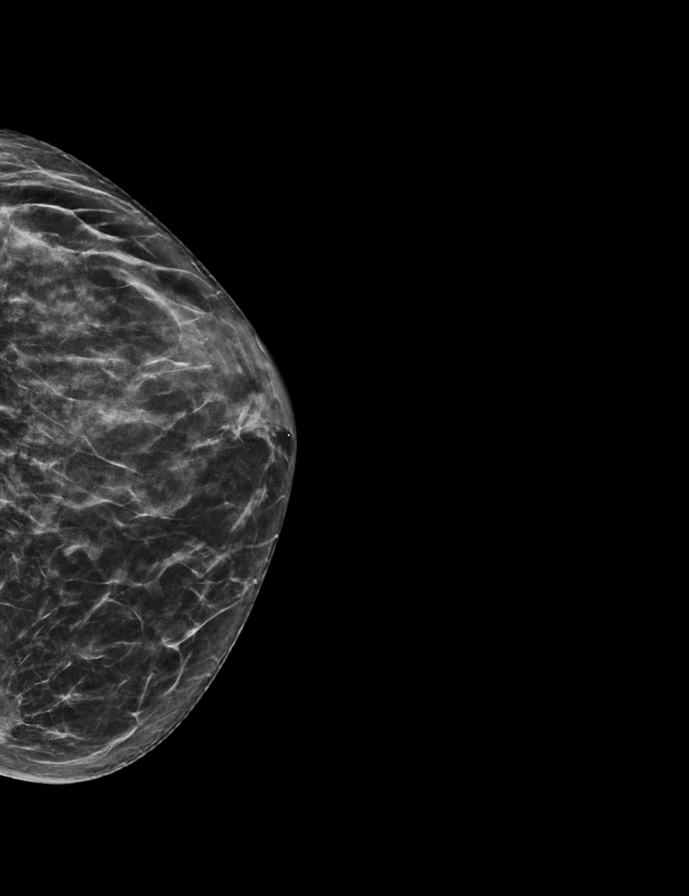

[L MLO synth-2D]
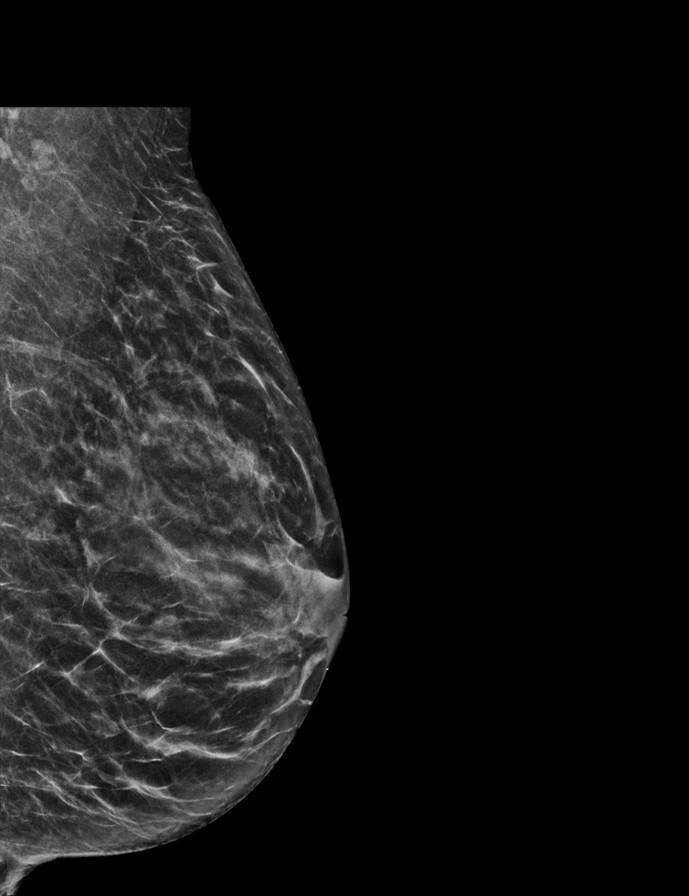

[R CC synth-2D]
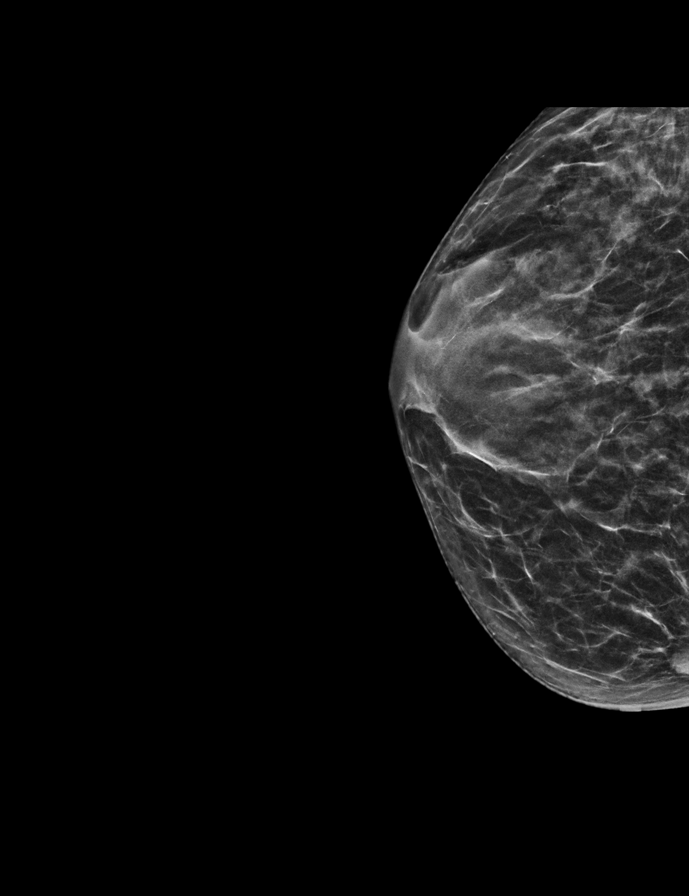

[R MLO tomo · 2 of 49 frames shown]
[frame 16/49]
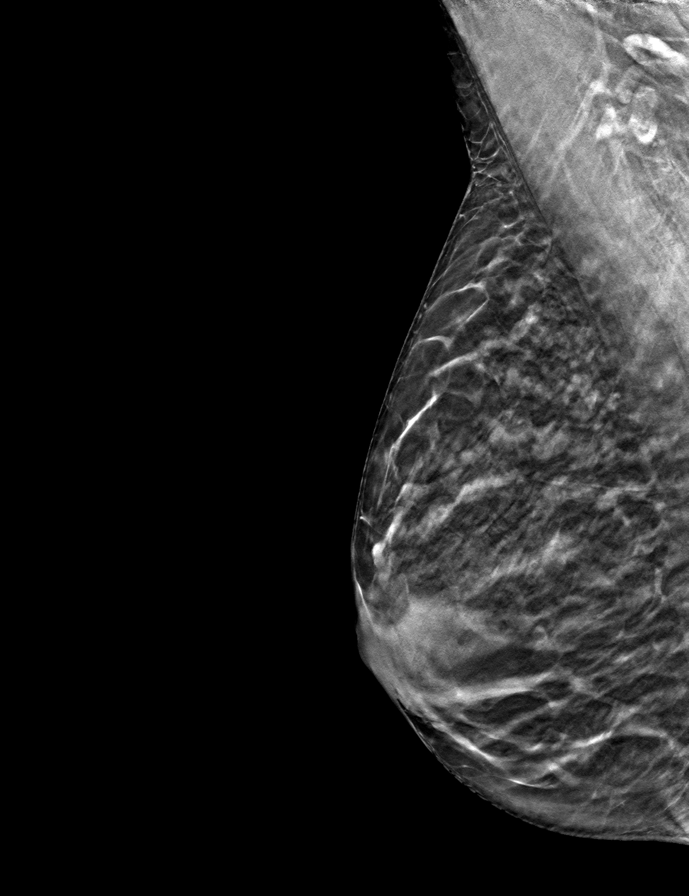
[frame 25/49]
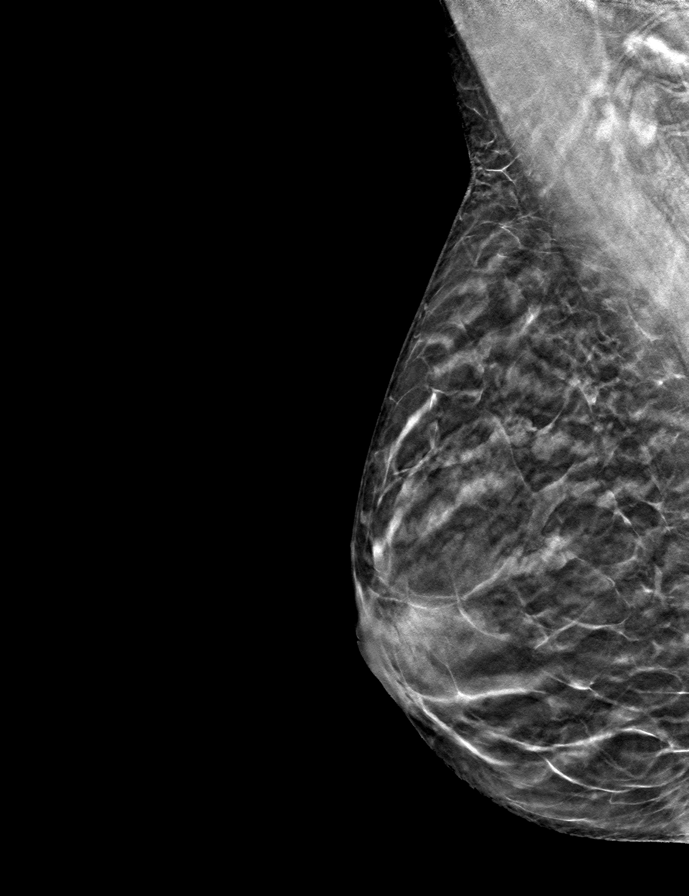

[L CC tomo · tomo slice 27/54.0]
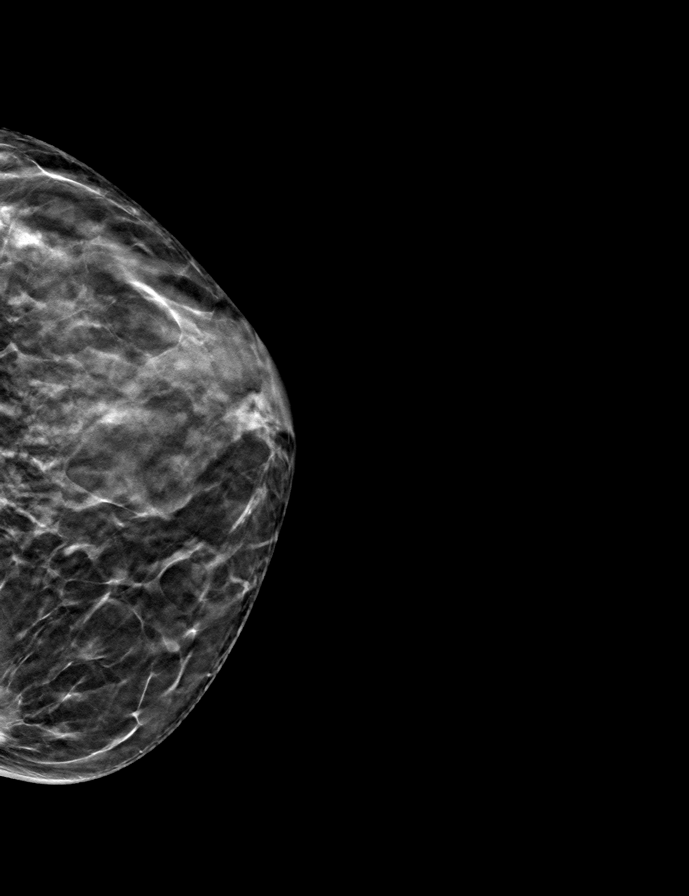

[R CC tomo · tomo slice 25/50.0]
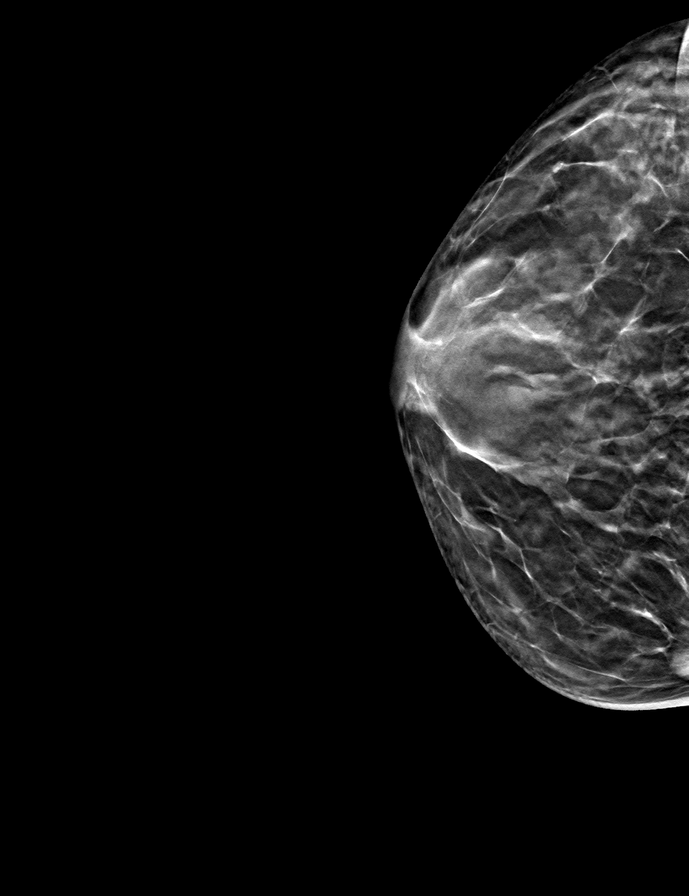

[L MLO tomo · tomo slice 25/49.0]
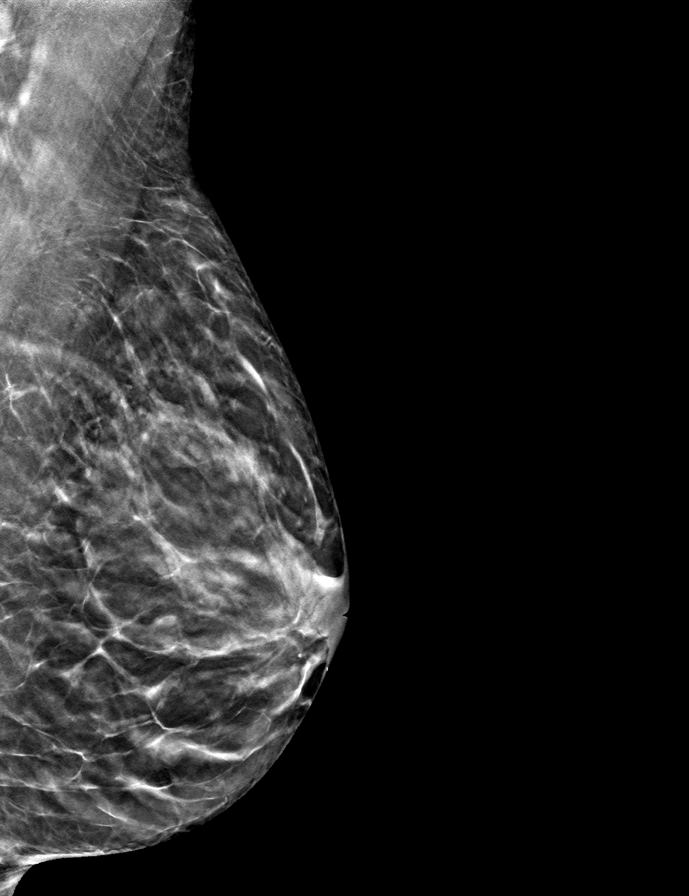

[9 of 24 positions shown; findings below may reference images not displayed]

ACR Breast Density Category c: The breast tissue is heterogeneously
dense, which may obscure small masses.
FINDINGS: There are no findings suspicious for malignancy.
IMPRESSION: No mammographic evidence of malignancy. A result letter of this
screening mammogram will be mailed directly to the patient.

RECOMMENDATION:
Screening mammogram in one year. (Code:Q3-W-BC3)

BI-RADS CATEGORY  1: Negative.

## 2022-12-10 NOTE — Progress Notes (Signed)
[name] [address] [place] [address] Phone: [phone] Subjective:   Bruce Donath, am serving as a scribe for Dr. Antoine Primas.  I'm seeing this patient by the request  of:  Sherlene Shams, MD  CC: Follow-up knee pain, right shoulder pain  BJY:[id number]  10/25/2022 PRP B knees  Update 12/11/2022 Rebecca Cole is a 62 y.o. female coming in with complaint of B knee pain. Patient states that she is doing a lot better. More range or motion and less pain. Able to kneel down. Stairs do bother her.   R shoulder pain after painting house. Patient also notes tinnitus in L ear when R shoulder pain increases. Pain in back of shoulder.        Past Medical History:  Diagnosis Date   Graves Disease Sept 2010   s/p 131 ablation, managed by [facility] [facility] Dr. Theodora Blow' orbitopathy 2010   managed by [facility]   Past Surgical History:  Procedure Laterality Date   BREAST BIOPSY  2006   negative   BREAST CYST EXCISION Right 2006   Social History   Socioeconomic History   Marital status: Single    Spouse name: Not on file   Number of children: Not on file   Years of education: Not on file   Highest education level: Not on file  Occupational History   Occupation: full-time air traffic controller    Employer: [employer]  Tobacco Use   Smoking status: Never   Smokeless tobacco: Never  Vaping Use   Vaping Use: Never used  Substance and Sexual Activity   Alcohol use: Yes    Comment: 3-4 a month   Drug use: No   Sexual activity: Not Currently    Birth control/protection: Post-menopausal, Abstinence  Other Topics Concern   Not on file  Social History Narrative   Full-time air traffic controller, has Education officer, community.   Has no pets   Social Determinants of Corporate investment banker Strain: Not on file  Food Insecurity: Not on file  Transportation Needs: Not on file  Physical Activity: Not on  file  Stress: Not on file  Social Connections: Not on file   Allergies  Allergen Reactions   Methimazole    Family History  Problem Relation Age of Onset   Dementia Mother    Heart disease Father        CABG, Ablation for Atrial Flutter, valve replacement, pacemaker, pericardial effusion s/p window   Cancer Father        late 97's history of colon CA   Cancer Maternal Grandmother        possibly benign   Breast cancer Maternal Grandmother     Current Outpatient Medications (Endocrine & Metabolic):    levothyroxine (SYNTHROID) 100 MCG tablet, TAKE 1 TABLET BY MOUTH EVERY DAY BEFORE BREAKFAST   levothyroxine (SYNTHROID) 88 MCG tablet, TAKE ONE TABLET WEEKLY (AND 1 TABLET OF 100 MCG DAILY FOR 5 DAYS WEEKLY)   Teprotumumab-trbw (TEPEZZA IV), Inject into the vein.  Current Outpatient Medications (Cardiovascular):    EPINEPHRINE 0.3 mg/0.3 mL IJ SOAJ injection, INJECT 0.3 MLS (0.3 MG TOTAL) INTO THE MUSCLE AS NEEDED FOR ANAPHYLAXIS.  Current Outpatient Medications (Respiratory):    fluticasone (FLONASE) 50 MCG/ACT nasal spray, Place 2 sprays into both nostrils daily.    Current Outpatient Medications (Other):    Cholecalciferol (VITAMIN D3) 1000 UNITS CAPS, Take 1 capsule by mouth  daily.   doxycycline (VIBRA-TABS) 100 MG tablet, Take 1 tablet (100 mg total) by mouth 2 (two) times daily.   Estradiol 10 MCG TABS vaginal tablet, Place 1 tablet (10 mcg total) vaginally 2 (two) times a week.   ketoconazole (NIZORAL) 2 % cream, Apply 1 application. topically daily.   triamcinolone cream (KENALOG) 0.1 %, Apply 1 application. topically 2 (two) times daily. As needed for rash   Reviewed prior external information including notes and imaging from  primary care provider As well as notes that were available from care everywhere and other healthcare systems.  Past medical history, social, surgical and family history all reviewed in electronic medical record.  No pertanent information unless  stated regarding to the chief complaint.   Review of Systems:  No headache, visual changes, nausea, vomiting, diarrhea, constipation, dizziness, abdominal pain, skin rash, fevers, chills, night sweats, weight loss, swollen lymph nodes, body aches, joint swelling, chest pain, shortness of breath, mood changes. POSITIVE muscle aches  Objective  Blood pressure 96/62, pulse (!) 54, height 5\' 8"  (1.727 m), weight 134 lb (60.8 kg), last menstrual period 10/01/2019, SpO2 98 %.   General: No apparent distress alert and oriented x3 mood and affect normal, dressed appropriately.  HEENT: Pupils equal, extraocular movements intact  Respiratory: Patient's speak in full sentences and does not appear short of breath  Cardiovascular: No lower extremity edema, non tender, no erythema  Right shoulder shows positive impingement noted.  Mild positive crossover noted as well.  Rotator cuff strength does appear to be intact  Limited muscular skeletal ultrasound was performed and interpreted by Antoine Primas, M   Ultrasound showed some hypoechoic changes noted again subacromial space.  Patient does have hypoechoic changes noted of the posterior labrum that is consistent with questionable tearing patient does have some hypoechoic changes also noted to the acromioclavicular joint. Impression: Bursitis of the shoulder in a chronic AC arthritis patient also has what appears to be labral pathology  Procedure: Real-time Ultrasound Guided Injection of right glenohumeral joint Device: GE Logiq Q7  Ultrasound guided injection is preferred based studies that show increased duration, increased effect, greater accuracy, decreased procedural pain, increased response rate with ultrasound guided versus blind injection.  Verbal informed consent obtained.  Time-out conducted.  Noted no overlying erythema, induration, or other signs of local infection.  Skin prepped in a sterile fashion.  Local anesthesia: Topical Ethyl chloride.   With sterile technique and under real time ultrasound guidance:  Joint visualized.  23g 1  inch needle inserted posterior approach. Pictures taken for needle placement. Patient did have injection of 2 cc of 0.5% Marcaine, and 1.0 cc of Kenalog 40 mg/dL. Completed without difficulty  Pain immediately resolved suggesting accurate placement of the medication.  Advised to call if fevers/chills, erythema, induration, drainage, or persistent bleeding.  Impression: Technically successful ultrasound guided injection.  Procedure: Real-time Ultrasound Guided Injection of right acromioclavicular joint Device: GE Logiq Q7 Ultrasound guided injection is preferred based studies that show increased duration, increased effect, greater accuracy, decreased procedural pain, increased response rate, and decreased cost with ultrasound guided versus blind injection.  Verbal informed consent obtained.  Time-out conducted.  Noted no overlying erythema, induration, or other signs of local infection.  Skin prepped in a sterile fashion.  Local anesthesia: Topical Ethyl chloride.  With sterile technique and under real time ultrasound guidance: With a 25-gauge half inch needle injected with 0.5 cc of 0.5% Marcaine and 0.5 cc of Kenalog 40 mg/mL Completed without difficulty  Pain immediately resolved suggesting accurate placement of the medication.  Advised to call if fevers/chills, erythema, induration, drainage, or persistent bleeding.  Impression: Technically successful ultrasound guided injection.   Impression and Recommendations:     The above documentation has been reviewed and is accurate and complete Judi Saa, DO

## 2022-12-11 ENCOUNTER — Other Ambulatory Visit: Payer: Self-pay

## 2022-12-11 ENCOUNTER — Ambulatory Visit (INDEPENDENT_AMBULATORY_CARE_PROVIDER_SITE_OTHER): Payer: Federal, State, Local not specified - PPO

## 2022-12-11 ENCOUNTER — Ambulatory Visit: Payer: Federal, State, Local not specified - PPO | Admitting: Family Medicine

## 2022-12-11 VITALS — BP 96/62 | HR 54 | Ht 68.0 in | Wt 134.0 lb

## 2022-12-11 DIAGNOSIS — M25561 Pain in right knee: Secondary | ICD-10-CM

## 2022-12-11 DIAGNOSIS — M25562 Pain in left knee: Secondary | ICD-10-CM

## 2022-12-11 DIAGNOSIS — S43431A Superior glenoid labrum lesion of right shoulder, initial encounter: Secondary | ICD-10-CM

## 2022-12-11 DIAGNOSIS — M25511 Pain in right shoulder: Secondary | ICD-10-CM | POA: Diagnosis not present

## 2022-12-11 DIAGNOSIS — M19011 Primary osteoarthritis, right shoulder: Secondary | ICD-10-CM | POA: Diagnosis not present

## 2022-12-11 DIAGNOSIS — M19019 Primary osteoarthritis, unspecified shoulder: Secondary | ICD-10-CM | POA: Insufficient documentation

## 2022-12-11 DIAGNOSIS — G8929 Other chronic pain: Secondary | ICD-10-CM | POA: Diagnosis not present

## 2022-12-11 NOTE — Assessment & Plan Note (Signed)
Given injection and tolerated the procedure well, discussed icing regimen and home exercises.  Does have cervical radiculopathy will need to monitor as well.  Increase activity slowly over the course of several weeks.  Follow-up again in 6-8 weeks

## 2022-12-11 NOTE — Patient Instructions (Addendum)
Xray today Exercises Injections in shoulder today See me again in 6-8 weeks

## 2022-12-11 NOTE — Assessment & Plan Note (Addendum)
Patient given injection and tolerated the procedure well, discussed icing regimen and home exercises, which activities to do and which ones to avoid.  We discussed that the labral pathology could give her some difficulty with range of motion and will need to monitor.  Worsening pain advanced imaging would be warranted.  Hopeful that patient will make improvement with conservative therapy at this time.  Follow-up again in 6 to 8 weeks

## 2022-12-12 DIAGNOSIS — H43391 Other vitreous opacities, right eye: Secondary | ICD-10-CM | POA: Diagnosis not present

## 2022-12-12 DIAGNOSIS — H43392 Other vitreous opacities, left eye: Secondary | ICD-10-CM | POA: Diagnosis not present

## 2022-12-12 DIAGNOSIS — H35372 Puckering of macula, left eye: Secondary | ICD-10-CM | POA: Diagnosis not present

## 2022-12-12 DIAGNOSIS — Z961 Presence of intraocular lens: Secondary | ICD-10-CM | POA: Diagnosis not present

## 2022-12-12 DIAGNOSIS — H33001 Unspecified retinal detachment with retinal break, right eye: Secondary | ICD-10-CM | POA: Diagnosis not present

## 2022-12-12 DIAGNOSIS — H43813 Vitreous degeneration, bilateral: Secondary | ICD-10-CM | POA: Diagnosis not present

## 2022-12-12 DIAGNOSIS — H33021 Retinal detachment with multiple breaks, right eye: Secondary | ICD-10-CM | POA: Diagnosis not present

## 2022-12-13 DIAGNOSIS — H33021 Retinal detachment with multiple breaks, right eye: Secondary | ICD-10-CM | POA: Diagnosis not present

## 2022-12-14 DIAGNOSIS — Z9889 Other specified postprocedural states: Secondary | ICD-10-CM | POA: Diagnosis not present

## 2022-12-14 DIAGNOSIS — H33021 Retinal detachment with multiple breaks, right eye: Secondary | ICD-10-CM | POA: Diagnosis not present

## 2022-12-21 DIAGNOSIS — Z9889 Other specified postprocedural states: Secondary | ICD-10-CM | POA: Diagnosis not present

## 2022-12-21 DIAGNOSIS — H33021 Retinal detachment with multiple breaks, right eye: Secondary | ICD-10-CM | POA: Diagnosis not present

## 2023-01-07 ENCOUNTER — Encounter: Payer: Self-pay | Admitting: Internal Medicine

## 2023-01-07 ENCOUNTER — Ambulatory Visit (INDEPENDENT_AMBULATORY_CARE_PROVIDER_SITE_OTHER): Payer: Federal, State, Local not specified - PPO | Admitting: Internal Medicine

## 2023-01-07 VITALS — BP 120/68 | HR 50 | Temp 97.6°F | Ht 68.0 in | Wt 132.4 lb

## 2023-01-07 DIAGNOSIS — E063 Autoimmune thyroiditis: Secondary | ICD-10-CM | POA: Diagnosis not present

## 2023-01-07 DIAGNOSIS — H6992 Unspecified Eustachian tube disorder, left ear: Secondary | ICD-10-CM

## 2023-01-07 DIAGNOSIS — E785 Hyperlipidemia, unspecified: Secondary | ICD-10-CM

## 2023-01-07 DIAGNOSIS — R7301 Impaired fasting glucose: Secondary | ICD-10-CM

## 2023-01-07 DIAGNOSIS — Z0000 Encounter for general adult medical examination without abnormal findings: Secondary | ICD-10-CM | POA: Diagnosis not present

## 2023-01-07 LAB — CBC WITH DIFFERENTIAL/PLATELET
Basophils Absolute: 0 10*3/uL (ref 0.0–0.1)
Basophils Relative: 0.9 % (ref 0.0–3.0)
Eosinophils Absolute: 0.1 10*3/uL (ref 0.0–0.7)
Eosinophils Relative: 3.3 % (ref 0.0–5.0)
HCT: 37 % (ref 36.0–46.0)
Hemoglobin: 12.3 g/dL (ref 12.0–15.0)
Lymphocytes Relative: 30.6 % (ref 12.0–46.0)
Lymphs Abs: 1.1 10*3/uL (ref 0.7–4.0)
MCHC: 33.2 g/dL (ref 30.0–36.0)
MCV: 93.2 fl (ref 78.0–100.0)
Monocytes Absolute: 0.3 10*3/uL (ref 0.1–1.0)
Monocytes Relative: 9 % (ref 3.0–12.0)
Neutro Abs: 2 10*3/uL (ref 1.4–7.7)
Neutrophils Relative %: 56.2 % (ref 43.0–77.0)
Platelets: 277 10*3/uL (ref 150.0–400.0)
RBC: 3.97 Mil/uL (ref 3.87–5.11)
RDW: 13.7 % (ref 11.5–15.5)
WBC: 3.6 10*3/uL — ABNORMAL LOW (ref 4.0–10.5)

## 2023-01-07 LAB — COMPREHENSIVE METABOLIC PANEL
ALT: 13 U/L (ref 0–35)
AST: 19 U/L (ref 0–37)
Albumin: 4.3 g/dL (ref 3.5–5.2)
Alkaline Phosphatase: 55 U/L (ref 39–117)
BUN: 10 mg/dL (ref 6–23)
CO2: 29 mEq/L (ref 19–32)
Calcium: 9 mg/dL (ref 8.4–10.5)
Chloride: 104 mEq/L (ref 96–112)
Creatinine, Ser: 0.74 mg/dL (ref 0.40–1.20)
GFR: 87.87 mL/min (ref >60.00)
Glucose, Bld: 96 mg/dL (ref 70–99)
Potassium: 3.6 mEq/L (ref 3.5–5.1)
Sodium: 141 mEq/L (ref 135–145)
Total Bilirubin: 0.6 mg/dL (ref 0.2–1.2)
Total Protein: 6.6 g/dL (ref 6.0–8.3)

## 2023-01-07 LAB — LIPID PANEL
Cholesterol: 219 mg/dL — ABNORMAL HIGH (ref 0–200)
HDL: 79.8 mg/dL (ref >39.00)
LDL Cholesterol: 126 mg/dL — ABNORMAL HIGH (ref 0–99)
NonHDL: 138.9
Total CHOL/HDL Ratio: 3
Triglycerides: 66 mg/dL (ref 0.0–149.0)
VLDL: 13.2 mg/dL (ref 0.0–40.0)

## 2023-01-07 LAB — MICROALBUMIN / CREATININE URINE RATIO
Creatinine,U: 78.9 mg/dL
Microalb Creat Ratio: 0.9 mg/g (ref 0.0–30.0)
Microalb, Ur: <0.7 mg/dL (ref 0.0–1.9)

## 2023-01-07 LAB — HEMOGLOBIN A1C: Hgb A1c MFr Bld: 6 % (ref 4.6–6.5)

## 2023-01-07 LAB — TSH: TSH: 4 u[IU]/mL (ref 0.35–5.50)

## 2023-01-07 MED ORDER — TRIAMCINOLONE ACETONIDE 0.1 % EX CREA: 1.0000 | TOPICAL_CREAM | Freq: Two times a day (BID) | CUTANEOUS | 0 refills | Status: AC

## 2023-01-07 MED ORDER — KETOCONAZOLE 2 % EX CREA: 1.0000 | TOPICAL_CREAM | Freq: Every day | CUTANEOUS | 0 refills | Status: DC

## 2023-01-07 MED ORDER — DOXYCYCLINE HYCLATE 100 MG PO TABS: 100.0000 mg | ORAL_TABLET | Freq: Two times a day (BID) | ORAL | 0 refills | Status: AC

## 2023-01-07 MED ORDER — EPINEPHRINE 0.3 MG/0.3ML IJ SOAJ: 0.3000 mg | INTRAMUSCULAR | 1 refills | Status: DC | PRN

## 2023-01-07 NOTE — Assessment & Plan Note (Addendum)
S/p I31 ablation , now taking thyroid supplement.  Currently taking 88 mcg one day wer week,  100  mcg 5 days per week,  and no medication one day  per week .  She prefers to keep her TSH around 1.  Will recommend adding  88 mcg on day 7   Lab Results  Component Value Date   TSH 4.00 01/07/2023

## 2023-01-07 NOTE — Patient Instructions (Addendum)
Cut out the bread for breakfast using the following option   Veggies Made Great ! Makes a low carb spinach & egg white frittata   that microwaves in minutes   You might want to try a premixed protein drink as well     Premier Protein is great tasting,   very low sugar and available of < $2 serving at [facility] and  In bulk for $1.50/serving at [employer] and [employer]  .    Nutritional analysis :  160 cal  30 g protein  1 g sugar 50% calcium needs     [employer] also makes a comparable one (not as good tasting,  but great for lactose intolerant people)

## 2023-01-07 NOTE — Assessment & Plan Note (Signed)

## 2023-01-07 NOTE — Assessment & Plan Note (Signed)
Recent recurrence with tinnitus, now resolved with chiropractic manipulation

## 2023-01-07 NOTE — Progress Notes (Signed)
Patient ID: Rebecca Cole, female    DOB: [birth year]  Age: 62 y.o. MRN: [id number]  The patient is here for annual preventive examination and management of other chronic and acute problems.   The risk factors are reflected in the social history.   The roster of all physicians providing medical care to patient - is listed in the Snapshot section of the chart.   Activities of daily living:  The patient is 100% independent in all ADLs: dressing, toileting, feeding as well as independent mobility   Home safety : The patient has smoke detectors in the home. They wear seatbelts.  There are no unsecured firearms at home. There is no violence in the home.    There is no risks for hepatitis, STDs or HIV. There is no   history of blood transfusion. They have no travel history to infectious disease endemic areas of the world.   The patient has seen their dentist in the last six month. They have seen their eye doctor in the last year. The patinet  denies slight hearing difficulty with regard to whispered voices and some television programs.  They have deferred audiologic testing in the last year.  They do not  have excessive sun exposure. Discussed the need for sun protection: hats, long sleeves and use of sunscreen if there is significant sun exposure.    Diet: the importance of a healthy diet is discussed. She has a healthy diet.   The benefits of regular aerobic exercise were discussed. The patient  exercises  vigorously  daily for over 30  minutes.    Depression screen: there are no signs or vegative symptoms of depression- irritability, change in appetite, anhedonia, sadness/tearfullness.   The following portions of the patient's history were reviewed and updated as appropriate: allergies, current medications, past family history, past medical history,  past surgical history, past social history  and problem list.   Visual acuity was not assessed per patient preference since the patient has  regular follow up with an  ophthalmologist. Hearing and body mass index were assessed and reviewed.    During the course of the visit the patient was educated and counseled about appropriate screening and preventive services including : fall prevention , diabetes screening, nutrition counseling, colorectal cancer screening, and recommended immunizations.    Chief Complaint:   left ear tinnitus : improved after chiropractic manipulation of neck and shoulders by   Dr.  Marcelina Morel [name] ).   No vertigo  Right  shoulder recently experienced a torn labrum  diagnosed by [name] MD  due to repetitive labor while working on house.  Received kenalog injection which imporved internal rotation,  and  PT ordered byt delayed by need for eye surgery  Cataract surgery bilateral,  [place] ,  complicated by detached retina  ,  right eye several months alter,  repaired by [name] , 3.5 weeks ago   Type 2 DM: by A1c only,  CBG was 112 fasting recenty (pre oper)    GYN with [name] coming up.     Review of Symptoms  Patient denies headache, fevers, malaise, unintentional weight loss, skin rash, eye pain, sinus congestion and sinus pain, sore throat, dysphagia,  hemoptysis , cough, dyspnea, wheezing, chest pain, palpitations, orthopnea, edema, abdominal pain, nausea, melena, diarrhea, constipation, flank pain, dysuria, hematuria, urinary  Frequency, nocturia, numbness, tingling, seizures,  Focal weakness, Loss of consciousness,  Tremor, insomnia, depression, anxiety, and suicidal ideation.    Physical Exam:  BP  120/68   Pulse (!) 50   Temp 97.6 F (36.4 C) (Oral)   Ht 5\' 8"  (1.727 m)   Wt 132 lb 6.4 oz (60.1 kg)   LMP 10/01/2019   SpO2 98%   BMI 20.13 kg/m    Physical Exam Vitals reviewed.  Constitutional:      General: She is not in acute distress.    Appearance: Normal appearance. She is normal weight. She is not ill-appearing, toxic-appearing or diaphoretic.  HENT:      Head: Normocephalic.  Eyes:     General: No scleral icterus.       Right eye: No discharge.        Left eye: No discharge.     Conjunctiva/sclera: Conjunctivae normal.  Cardiovascular:     Rate and Rhythm: Normal rate and regular rhythm.     Heart sounds: Normal heart sounds.  Pulmonary:     Effort: Pulmonary effort is normal. No respiratory distress.     Breath sounds: Normal breath sounds.  Musculoskeletal:        General: Normal range of motion.  Skin:    General: Skin is warm and dry.  Neurological:     General: No focal deficit present.     Mental Status: She is alert and oriented to person, place, and time. Mental status is at baseline.  Psychiatric:        Mood and Affect: Mood normal.        Behavior: Behavior normal.        Thought Content: Thought content normal.        Judgment: Judgment normal.    Assessment and Plan: Hypothyroidism, acquired, autoimmune Assessment & Plan: S/p I31 ablation , now taking thyroid supplement.  Currently taking 88 mcg one day wer week,  100  mcg 5 days per week,  and no medication one day  per week .  She prefers to keep her TSH around 1.  Will recommend adding  88 mcg on day 7   Lab Results  Component Value Date   TSH 4.00 01/07/2023       Orders: -     TSH  Impaired fasting glucose Assessment & Plan: Previous A1c was 6.5,  but using fructosamine conversion,  6.25 .   She is following a low glycemic index diet and exercising .  Repeat A1c is lower /fructosamine pending   Lab Results  Component Value Date   HGBA1C 6.0 01/07/2023     Orders: -     Comprehensive metabolic panel -     Hemoglobin A1c -     Fructosamine -     Microalbumin / creatinine urine ratio  Dyslipidemia -     Lipid panel -     CBC with Differential/Platelet  Eustachian tube disorder, left Assessment & Plan: Recent recurrence with tinnitus, now resolved with chiropractic manipulation    Encounter for preventive health examination Assessment &  Plan: age appropriate education and counseling updated, referrals for preventative services and immunizations addressed, dietary and smoking counseling addressed, most recent labs reviewed.  I have personally reviewed and have noted:   1) the patient's medical and social history 2) The pt's use of alcohol, tobacco, and illicit drugs 3) The patient's current medications and supplements 4) Functional ability including ADL's, fall risk, home safety risk, hearing and visual impairment 5) Diet and physical activities 6) Evidence for depression or mood disorder 7) The patient's height, weight, and BMI have been recorded in the chart  I  have made referrals, and provided counseling and education based on review of the above    Other orders -     Doxycycline Hyclate; Take 1 tablet (100 mg total) by mouth 2 (two) times daily.  Dispense: 20 tablet; Refill: 0 -     EPINEPHrine; Inject 0.3 mg into the muscle as needed for anaphylaxis.  Dispense: 2 each; Refill: 1 -     Ketoconazole; Apply 1 Application topically daily.  Dispense: 15 g; Refill: 0 -     Triamcinolone Acetonide; Apply 1 Application topically 2 (two) times daily. As needed for rash  Dispense: 80 g; Refill: 0    Return in about 1 year (around 01/07/2024) for physical.  Sherlene Shams, MD

## 2023-01-07 NOTE — Assessment & Plan Note (Signed)
Previous A1c was 6.5,  but using fructosamine conversion,  6.25 .   She is following a low glycemic index diet and exercising .  Repeat A1c is lower /fructosamine pending   Lab Results  Component Value Date   HGBA1C 6.0 01/07/2023

## 2023-01-08 ENCOUNTER — Encounter: Payer: Self-pay | Admitting: Internal Medicine

## 2023-01-08 ENCOUNTER — Ambulatory Visit: Payer: Federal, State, Local not specified - PPO | Admitting: Obstetrics & Gynecology

## 2023-01-08 DIAGNOSIS — E559 Vitamin D deficiency, unspecified: Secondary | ICD-10-CM

## 2023-01-08 DIAGNOSIS — E538 Deficiency of other specified B group vitamins: Secondary | ICD-10-CM

## 2023-01-08 DIAGNOSIS — E063 Autoimmune thyroiditis: Secondary | ICD-10-CM

## 2023-01-09 MED ORDER — LEVOTHYROXINE SODIUM 88 MCG PO TABS: ORAL_TABLET | ORAL | 2 refills | Status: DC

## 2023-01-12 LAB — FRUCTOSAMINE: Fructosamine: 266 umol/L (ref 205–285)

## 2023-01-15 DIAGNOSIS — Z9889 Other specified postprocedural states: Secondary | ICD-10-CM | POA: Diagnosis not present

## 2023-01-15 DIAGNOSIS — H33021 Retinal detachment with multiple breaks, right eye: Secondary | ICD-10-CM | POA: Diagnosis not present

## 2023-01-21 NOTE — Progress Notes (Signed)
[facility] [address] [place] [address] Phone: [phone] Subjective:   Rebecca Cole, am serving as a scribe for Dr. Antoine Primas.  I'm seeing this patient by the request  of:  Sherlene Shams, MD  CC: Right shoulder and bilateral knee pain follow-up  BJY:[id number]  12/11/2022 Given injection and tolerated the procedure well, discussed icing regimen and home exercises.  Does have cervical radiculopathy will need to monitor as well.  Increase activity slowly over the course of several weeks.  Follow-up again in 6-8 weeks      Patient given injection and tolerated the procedure well, discussed icing regimen and home exercises, which activities to do and which ones to avoid.  We discussed that the labral pathology could give her some difficulty with range of motion and will need to monitor.  Worsening pain advanced imaging would be warranted.  Hopeful that patient will make improvement with conservative therapy at this time.  Follow-up again in 6 to 8 week      Update 01/28/2023 Rebecca Cole is a 62 y.o. female coming in with complaint of B knee and R shoulder. Patient states doing well since last visit. Shoulder doing much better. Minimal to no pain. Knees also doing well. No new concerns.      Past Medical History:  Diagnosis Date   Graves Disease Sept 2010   s/p 131 ablation, managed by [facility] [facility] Dr. Theodora Blow' orbitopathy 2010   managed by [facility]   Past Surgical History:  Procedure Laterality Date   BREAST BIOPSY  2006   negative   BREAST CYST EXCISION Right 2006   Social History   Socioeconomic History   Marital status: Single    Spouse name: Not on file   Number of children: Not on file   Years of education: Not on file   Highest education level: Not on file  Occupational History   Occupation: full-time air traffic controller    Employer: [employer]  Tobacco Use   Smoking  status: Never   Smokeless tobacco: Never  Vaping Use   Vaping Use: Never used  Substance and Sexual Activity   Alcohol use: Yes    Comment: 3-4 a month   Drug use: No   Sexual activity: Not Currently    Birth control/protection: Post-menopausal, Abstinence  Other Topics Concern   Not on file  Social History Narrative   Full-time air traffic controller, has Education officer, community.   Has no pets   Social Determinants of Corporate investment banker Strain: Not on file  Food Insecurity: Not on file  Transportation Needs: Not on file  Physical Activity: Not on file  Stress: Not on file  Social Connections: Not on file   Allergies  Allergen Reactions   Methimazole    Family History  Problem Relation Age of Onset   Dementia Mother    Heart disease Father        CABG, Ablation for Atrial Flutter, valve replacement, pacemaker, pericardial effusion s/p window   Cancer Father        late 81's history of colon CA   Cancer Maternal Grandmother        possibly benign   Breast cancer Maternal Grandmother     Current Outpatient Medications (Endocrine & Metabolic):    levothyroxine (SYNTHROID) 100 MCG tablet, TAKE 1 TABLET BY MOUTH EVERY DAY BEFORE BREAKFAST   levothyroxine (SYNTHROID) 88 MCG tablet, TAKE  ONE TABLET 2 DAYS PER WEEK  (AND 1 TABLET OF 100 MCG DAILY FOR 5 DAYS WEEKLY)  Current Outpatient Medications (Cardiovascular):    EPINEPHrine 0.3 mg/0.3 mL IJ SOAJ injection, Inject 0.3 mg into the muscle as needed for anaphylaxis.  Current Outpatient Medications (Respiratory):    fluticasone (FLONASE) 50 MCG/ACT nasal spray, Place 2 sprays into both nostrils daily.    Current Outpatient Medications (Other):    Cholecalciferol (VITAMIN D3) 1000 UNITS CAPS, Take 1 capsule by mouth daily.   doxycycline (VIBRA-TABS) 100 MG tablet, Take 1 tablet (100 mg total) by mouth 2 (two) times daily.   ketoconazole (NIZORAL) 2 % cream, Apply 1 Application topically daily.   triamcinolone cream  (KENALOG) 0.1 %, Apply 1 Application topically 2 (two) times daily. As needed for rash     Objective  Blood pressure 118/76, pulse 68, height 5\' 8"  (1.727 m), weight 131 lb (59.4 kg), last menstrual period 10/01/2019, SpO2 97 %.   General: No apparent distress alert and oriented x3 mood and affect normal, dressed appropriately.  HEENT: Pupils equal, extraocular movements intact  Respiratory: Patient's speak in full sentences and does not appear short of breath  Cardiovascular: No lower extremity edema, non tender, no erythema  Right shoulder exam has good range of motion noted.  No negative O'Brien's today which is an improvement.  Minimal discomfort over the acromioclavicular joint. Bilateral knees have some crepitus but nontender on exam today.     Impression and Recommendations:    The above documentation has been reviewed and is accurate and complete Judi Saa, DO

## 2023-01-28 ENCOUNTER — Ambulatory Visit: Payer: Federal, State, Local not specified - PPO | Admitting: Family Medicine

## 2023-01-28 ENCOUNTER — Encounter: Payer: Self-pay | Admitting: Family Medicine

## 2023-01-28 VITALS — BP 118/76 | HR 68 | Ht 68.0 in | Wt 131.0 lb

## 2023-01-28 DIAGNOSIS — M19011 Primary osteoarthritis, right shoulder: Secondary | ICD-10-CM | POA: Diagnosis not present

## 2023-01-28 DIAGNOSIS — M1710 Unilateral primary osteoarthritis, unspecified knee: Secondary | ICD-10-CM | POA: Diagnosis not present

## 2023-01-28 DIAGNOSIS — S43431A Superior glenoid labrum lesion of right shoulder, initial encounter: Secondary | ICD-10-CM

## 2023-01-28 NOTE — Assessment & Plan Note (Signed)
Significant improvement at this time.  Discussed icing regimen of home exercises.  Follow-up with me again as needed

## 2023-01-28 NOTE — Assessment & Plan Note (Signed)
Stable at the moment.  No significant changes in management follow-up with me as needed patient is wanting to consider PRP again in the knees before more traveling in the wintertime

## 2023-01-28 NOTE — Assessment & Plan Note (Signed)
Seems to have responded extremely well at this moment.  Has not had any significant pain.  Hopeful that patient will do relatively well.  Does show some scarring noted on the anterior aspect.

## 2023-02-06 ENCOUNTER — Telehealth: Payer: Self-pay | Admitting: Internal Medicine

## 2023-02-06 MED ORDER — LEVOTHYROXINE SODIUM 100 MCG PO TABS: ORAL_TABLET | ORAL | 1 refills | Status: DC

## 2023-02-06 NOTE — Telephone Encounter (Signed)
Prescription Request  02/06/2023  LOV: 01/07/2023  What is the name of the medication or equipment? levothyroxine (SYNTHROID) 100 MCG tablet. Patient will run out of medication on Friday.   Have you contacted your pharmacy to request a refill? Yes   Which pharmacy would you like this sent to?  [facility] [address] [place] - [address] [place] [address] Phone: [phone] Fax: [phone]    Patient notified that their request is being sent to the clinical staff for review and that they should receive a response within 2 business days.   Please advise at Mobile [phone] (mobile)

## 2023-02-06 NOTE — Telephone Encounter (Signed)
Medication has been refilled.

## 2023-02-27 ENCOUNTER — Other Ambulatory Visit (INDEPENDENT_AMBULATORY_CARE_PROVIDER_SITE_OTHER): Payer: Federal, State, Local not specified - PPO

## 2023-02-27 DIAGNOSIS — E559 Vitamin D deficiency, unspecified: Secondary | ICD-10-CM | POA: Diagnosis not present

## 2023-02-27 DIAGNOSIS — E538 Deficiency of other specified B group vitamins: Secondary | ICD-10-CM

## 2023-02-27 DIAGNOSIS — E063 Autoimmune thyroiditis: Secondary | ICD-10-CM

## 2023-02-28 DIAGNOSIS — H903 Sensorineural hearing loss, bilateral: Secondary | ICD-10-CM | POA: Diagnosis not present

## 2023-02-28 DIAGNOSIS — H9312 Tinnitus, left ear: Secondary | ICD-10-CM | POA: Diagnosis not present

## 2023-02-28 DIAGNOSIS — H90A22 Sensorineural hearing loss, unilateral, left ear, with restricted hearing on the contralateral side: Secondary | ICD-10-CM | POA: Diagnosis not present

## 2023-02-28 LAB — VITAMIN D 25 HYDROXY (VIT D DEFICIENCY, FRACTURES): VITD: 45.27 ng/mL (ref 30.00–100.00)

## 2023-02-28 LAB — VITAMIN B12: Vitamin B-12: 540 pg/mL (ref 211–911)

## 2023-02-28 NOTE — Assessment & Plan Note (Signed)
She was underactive on 588 mcg weekly dose (100x 5,  88 x 1)  and overactive on 676 mcg (100 x 5, 88 x 2) .  Recommend lowering dose to 652 (88 x 4,  100 x 3 )   Lab Results  Component Value Date   TSH 0.23 (L) 02/27/2023

## 2023-03-01 ENCOUNTER — Encounter: Payer: Self-pay | Admitting: Internal Medicine

## 2023-03-05 MED ORDER — LEVOTHYROXINE SODIUM 88 MCG PO TABS: ORAL_TABLET | ORAL | 2 refills | Status: DC

## 2023-04-02 DIAGNOSIS — Z1231 Encounter for screening mammogram for malignant neoplasm of breast: Secondary | ICD-10-CM | POA: Diagnosis not present

## 2023-04-02 DIAGNOSIS — Z01419 Encounter for gynecological examination (general) (routine) without abnormal findings: Secondary | ICD-10-CM | POA: Diagnosis not present

## 2023-04-02 DIAGNOSIS — Z139 Encounter for screening, unspecified: Secondary | ICD-10-CM | POA: Diagnosis not present

## 2023-04-23 DIAGNOSIS — Z780 Asymptomatic menopausal state: Secondary | ICD-10-CM | POA: Diagnosis not present

## 2023-04-23 DIAGNOSIS — E039 Hypothyroidism, unspecified: Secondary | ICD-10-CM | POA: Diagnosis not present

## 2023-04-23 DIAGNOSIS — Z13820 Encounter for screening for osteoporosis: Secondary | ICD-10-CM | POA: Diagnosis not present

## 2023-04-27 ENCOUNTER — Other Ambulatory Visit: Payer: Self-pay | Admitting: Internal Medicine

## 2023-04-30 ENCOUNTER — Telehealth: Payer: Self-pay | Admitting: Internal Medicine

## 2023-04-30 ENCOUNTER — Other Ambulatory Visit (INDEPENDENT_AMBULATORY_CARE_PROVIDER_SITE_OTHER): Payer: Federal, State, Local not specified - PPO

## 2023-04-30 DIAGNOSIS — E063 Autoimmune thyroiditis: Secondary | ICD-10-CM

## 2023-04-30 NOTE — Telephone Encounter (Signed)
Patient need lab orders.

## 2023-04-30 NOTE — Addendum Note (Signed)
Addended by: Sherlene Shams on: 04/30/2023 08:31 AM   Modules accepted: Orders

## 2023-05-01 LAB — TSH: TSH: 0.21 u[IU]/mL — ABNORMAL LOW (ref 0.35–5.50)

## 2023-05-15 DIAGNOSIS — E039 Hypothyroidism, unspecified: Secondary | ICD-10-CM | POA: Diagnosis not present

## 2023-05-15 DIAGNOSIS — M810 Age-related osteoporosis without current pathological fracture: Secondary | ICD-10-CM | POA: Diagnosis not present

## 2023-05-20 DIAGNOSIS — M810 Age-related osteoporosis without current pathological fracture: Secondary | ICD-10-CM | POA: Diagnosis not present

## 2023-05-22 ENCOUNTER — Encounter: Payer: Self-pay | Admitting: Internal Medicine

## 2023-05-22 DIAGNOSIS — E0500 Thyrotoxicosis with diffuse goiter without thyrotoxic crisis or storm: Secondary | ICD-10-CM

## 2023-05-23 ENCOUNTER — Ambulatory Visit (HOSPITAL_BASED_OUTPATIENT_CLINIC_OR_DEPARTMENT_OTHER): Payer: Federal, State, Local not specified - PPO

## 2023-05-23 ENCOUNTER — Other Ambulatory Visit (HOSPITAL_BASED_OUTPATIENT_CLINIC_OR_DEPARTMENT_OTHER): Payer: Self-pay

## 2023-05-23 ENCOUNTER — Ambulatory Visit (HOSPITAL_BASED_OUTPATIENT_CLINIC_OR_DEPARTMENT_OTHER): Payer: Federal, State, Local not specified - PPO | Admitting: Student

## 2023-05-23 ENCOUNTER — Encounter (HOSPITAL_BASED_OUTPATIENT_CLINIC_OR_DEPARTMENT_OTHER): Payer: Self-pay | Admitting: Student

## 2023-05-23 DIAGNOSIS — M25469 Effusion, unspecified knee: Secondary | ICD-10-CM

## 2023-05-23 DIAGNOSIS — M1712 Unilateral primary osteoarthritis, left knee: Secondary | ICD-10-CM

## 2023-05-23 DIAGNOSIS — M25462 Effusion, left knee: Secondary | ICD-10-CM | POA: Diagnosis not present

## 2023-05-23 DIAGNOSIS — S83012A Lateral subluxation of left patella, initial encounter: Secondary | ICD-10-CM | POA: Diagnosis not present

## 2023-05-23 MED ORDER — METHYLPREDNISOLONE 4 MG PO TBPK
ORAL_TABLET | ORAL | 0 refills | Status: AC
  Filled 2023-05-23: qty 21, 6d supply, fill #0

## 2023-05-24 DIAGNOSIS — M1712 Unilateral primary osteoarthritis, left knee: Secondary | ICD-10-CM

## 2023-05-24 DIAGNOSIS — M25462 Effusion, left knee: Secondary | ICD-10-CM | POA: Diagnosis not present

## 2023-05-24 NOTE — Progress Notes (Signed)
Chief Complaint: Left knee swelling     History of Present Illness:    Rebecca Cole is a 62 y.o. female presenting today with pain and swelling of the left knee.  She states that this began 3 days ago.  She had done a circuit class at the gym the day before but does not remember any particular injury.  States that pain is overall minimal but that her knee feels swollen and weak.  She has been seen by Dr. Antoine Primas for this knee and had an MRI on 05/10/2022 showing full-thickness lateral patellofemoral cartilage loss and partial thickness medial patellofemoral cartilage loss per report.  She has had PRP of the left knee 10/25/2022 and this did give significant relief.  She has tried icing, resting, and ibuprofen.  No other treatments at this time.  Patient worked as an Hotel manager.   Surgical History:   None  PMH/PSH/Family History/Social History/Meds/Allergies:    Past Medical History:  Diagnosis Date   Graves Disease Sept 2010   s/p 131 ablation, managed by [facility] [facility] Dr. Theodora Blow' orbitopathy 2010   managed by [facility] L[facility] B[facility]   Past Surgical History:  Procedure Laterality Date   BREAST BIOPSY  2006   negative   BREAST CYST EXCISION Right 2006   Social History   Socioeconomic History   Marital status: Single    Spouse name: Not on file   Number of children: Not on file   Years of education: Not on file   Highest education level: Not on file  Occupational History   Occupation: full-time air traffic controller    Employer: [employer]  Tobacco Use   Smoking status: Never   Smokeless tobacco: Never  Vaping Use   Vaping status: Never Used  Substance and Sexual Activity   Alcohol use: Yes    Comment: 3-4 a month   Drug use: No   Sexual activity: Not Currently    Birth control/protection: Post-menopausal, Abstinence  Other Topics Concern   Not on file  Social History Narrative    Full-time air traffic controller, has Education officer, community.   Has no pets   Social Determinants of Corporate investment banker Strain: Not on file  Food Insecurity: Not on file  Transportation Needs: Not on file  Physical Activity: Not on file  Stress: Not on file  Social Connections: Not on file   Family History  Problem Relation Age of Onset   Dementia Mother    Heart disease Father        CABG, Ablation for Atrial Flutter, valve replacement, pacemaker, pericardial effusion s/p window   Cancer Father        late 30's history of colon CA   Cancer Maternal Grandmother        possibly benign   Breast cancer Maternal Grandmother    Allergies  Allergen Reactions   Methimazole    Current Outpatient Medications  Medication Sig Dispense Refill   methylPREDNISolone (MEDROL DOSEPAK) 4 MG TBPK tablet Take per packet instructions 21 each 0   Cholecalciferol (VITAMIN D3) 1000 UNITS CAPS Take 1 capsule by mouth daily.     doxycycline (VIBRA-TABS) 100 MG tablet Take 1 tablet (100 mg total) by mouth 2 (two) times daily. 20 tablet 0   EPINEPHrine  0.3 mg/0.3 mL IJ SOAJ injection Inject 0.3 mg into the muscle as needed for anaphylaxis. 2 each 1   fluticasone (FLONASE) 50 MCG/ACT nasal spray Place 2 sprays into both nostrils daily. 16 g 2   ketoconazole (NIZORAL) 2 % cream Apply 1 Application topically daily. 15 g 0   levothyroxine (SYNTHROID) 100 MCG tablet TAKE 1 TABLET BY MOUTH EVERY DAY BEFORE BREAKFAST 90 tablet 1   levothyroxine (SYNTHROID) 88 MCG tablet TAKE ONE TABLET 4 DAYS PER WEEK (AND 1 TABLET OF 100 MCG DAILY FOR 3 DAYS WEEKLY) 108 tablet 1   triamcinolone cream (KENALOG) 0.1 % Apply 1 Application topically 2 (two) times daily. As needed for rash 80 g 0   No current facility-administered medications for this visit.   No results found.  Review of Systems:   A ROS was performed including pertinent positives and negatives as documented in the HPI.  Physical Exam :   Constitutional:  NAD and appears stated age Neurological: Alert and oriented Psych: Appropriate affect and cooperative Last menstrual period 10/01/2019.   Comprehensive Musculoskeletal Exam:    Left knee appears swollen with no evidence of erythema.  Moderate effusion present with slight warmth.  Active range of motion of the knee from 0 to 110 degrees.  No medial or lateral joint line tenderness.  No instability varus or valgus stress.  Imaging:   Xray (left knee 4 views): Effusion present.  Patellofemoral osteoarthritis particularly   I personally reviewed and interpreted the radiographs.   Assessment:   62 y.o. female with history of left knee patellofemoral osteoarthritis presenting with knee swelling.  I did discuss different etiologies concerning atraumatic knee swelling.  I do have low suspicion for gout or infection and believe this has likely been caused from pre-existing arthritis.  I have offered an aspiration today and after consideration patient would like to proceed with this.  Was able to aspirate 67 mL of clear synovial fluid without any complication.  I will plan to start her on a Medrol Dosepak as opposed to cortisone injections today.  She can continue following with Dr. Katrinka Blazing for PRP injections.  Plan :    - Left knee aspirated today after patient consent - Start Medrol Dosepak - Return to clinic as needed     Procedure Note  Patient: Rebecca Cole             Date of Birth: 1961/04/11           MRN: [id number]             Visit Date: 05/23/2023  Procedures: Visit Diagnoses:  1. Patellofemoral arthritis of left knee   2. Effusion, left knee     Large Joint Inj: L knee on 05/24/2023 9:24 AM Indications: pain and joint swelling Details: 18 G 1.5 in needle, superolateral approach Aspirate: 67 mL clear and serous Outcome: tolerated well, no immediate complications Procedure, treatment alternatives, risks and benefits explained, specific risks discussed. Consent was  given by the patient. Immediately prior to procedure a time out was called to verify the correct patient, procedure, equipment, support staff and site/side marked as required. Patient was prepped and draped in the usual sterile fashion.      I personally saw and evaluated the patient, and participated in the management and treatment plan.  Hazle Nordmann, PA-C Orthopedics

## 2023-06-04 NOTE — Telephone Encounter (Signed)
Noted  

## 2023-06-19 DIAGNOSIS — M818 Other osteoporosis without current pathological fracture: Secondary | ICD-10-CM | POA: Diagnosis not present

## 2023-06-19 DIAGNOSIS — E0500 Thyrotoxicosis with diffuse goiter without thyrotoxic crisis or storm: Secondary | ICD-10-CM | POA: Diagnosis not present

## 2023-06-19 DIAGNOSIS — E890 Postprocedural hypothyroidism: Secondary | ICD-10-CM | POA: Diagnosis not present

## 2023-06-19 LAB — TSH: TSH: 1.5 (ref 0.41–5.90)

## 2023-06-21 ENCOUNTER — Ambulatory Visit (HOSPITAL_BASED_OUTPATIENT_CLINIC_OR_DEPARTMENT_OTHER): Payer: Federal, State, Local not specified - PPO | Admitting: Student

## 2023-06-21 ENCOUNTER — Encounter (HOSPITAL_BASED_OUTPATIENT_CLINIC_OR_DEPARTMENT_OTHER): Payer: Self-pay | Admitting: Student

## 2023-06-21 DIAGNOSIS — M25462 Effusion, left knee: Secondary | ICD-10-CM | POA: Diagnosis not present

## 2023-06-21 DIAGNOSIS — M25562 Pain in left knee: Secondary | ICD-10-CM | POA: Diagnosis not present

## 2023-06-21 NOTE — Progress Notes (Signed)
Chief Complaint: Left knee swelling     History of Present Illness:   06/21/23: Patient is here today for follow-up of her left knee.  She states that her knee swelling returned 2 days ago.  She denies any significant pain today.  She is managed by endocrinology for osteoporosis and took her monthly Boniva earlier this week the day before the swelling returned.  She has realized this also happened shortly after taking this medicine last month, and that endocrinology believes it is a medication side effect.  They are planning to discontinue this medication.  She denies any fever, chills, or fatigue.  Has difficulty with knee flexion.   05/23/23: Rebecca Cole is a 62 y.o. female presenting today with pain and swelling of the left knee.  She states that this began 3 days ago.  She had done a circuit class at the gym the day before but does not remember any particular injury.  States that pain is overall minimal but that her knee feels swollen and weak.  She has been seen by Dr. Antoine Primas for this knee and had an MRI on 05/10/2022 showing full-thickness lateral patellofemoral cartilage loss and partial thickness medial patellofemoral cartilage loss per report.  She has had PRP of the left knee 10/25/2022 and this did give significant relief.  She has tried icing, resting, and ibuprofen.  No other treatments at this time.  Patient worked as an Hotel manager.   Surgical History:   None  PMH/PSH/Family History/Social History/Meds/Allergies:    Past Medical History:  Diagnosis Date   Graves Disease Sept 2010   s/p 131 ablation, managed by [place] [facility] Dr. Theodora Blow' orbitopathy 2010   managed by [facility] L[facility] B[facility]   Past Surgical History:  Procedure Laterality Date   BREAST BIOPSY  2006   negative   BREAST CYST EXCISION Right 2006   Social History   Socioeconomic History   Marital status: Single    Spouse name: Not  on file   Number of children: Not on file   Years of education: Not on file   Highest education level: Not on file  Occupational History   Occupation: full-time air traffic controller    Employer: [employer]  Tobacco Use   Smoking status: Never   Smokeless tobacco: Never  Vaping Use   Vaping status: Never Used  Substance and Sexual Activity   Alcohol use: Yes    Comment: 3-4 a month   Drug use: No   Sexual activity: Not Currently    Birth control/protection: Post-menopausal, Abstinence  Other Topics Concern   Not on file  Social History Narrative   Full-time air traffic controller, has Education officer, community.   Has no pets   Social Determinants of Health   Financial Resource Strain: Low Risk  (06/19/2023)   Received from [facility]   Overall Financial Resource Strain (CARDIA)    Difficulty of Paying Living Expenses: Not hard at all  Food Insecurity: No Food Insecurity (06/19/2023)   Received from [facility]   Hunger Vital Sign    Worried About Running Out of Food in the Last Year: Never true    Ran Out of Food in the Last Year: Never true  Transportation Needs: No Transportation Needs (06/19/2023)  Received from [facility] - [facility]    In the past 12 months, has lack of transportation kept you from medical appointments or from getting medications?: No    Lack of Transportation (Non-Medical): No  Physical Activity: Not on file  Stress: Not on file  Social Connections: Not on file   Family History  Problem Relation Age of Onset   Dementia Mother    Heart disease Father        CABG, Ablation for Atrial Flutter, valve replacement, pacemaker, pericardial effusion s/p window   Cancer Father        late 41's history of colon CA   Cancer Maternal Grandmother        possibly benign   Breast cancer Maternal Grandmother    Allergies  Allergen Reactions   Methimazole    Current Outpatient Medications   Medication Sig Dispense Refill   Cholecalciferol (VITAMIN D3) 1000 UNITS CAPS Take 1 capsule by mouth daily.     doxycycline (VIBRA-TABS) 100 MG tablet Take 1 tablet (100 mg total) by mouth 2 (two) times daily. 20 tablet 0   EPINEPHrine 0.3 mg/0.3 mL IJ SOAJ injection Inject 0.3 mg into the muscle as needed for anaphylaxis. 2 each 1   fluticasone (FLONASE) 50 MCG/ACT nasal spray Place 2 sprays into both nostrils daily. 16 g 2   ketoconazole (NIZORAL) 2 % cream Apply 1 Application topically daily. 15 g 0   levothyroxine (SYNTHROID) 100 MCG tablet TAKE 1 TABLET BY MOUTH EVERY DAY BEFORE BREAKFAST 90 tablet 1   levothyroxine (SYNTHROID) 88 MCG tablet TAKE ONE TABLET 4 DAYS PER WEEK (AND 1 TABLET OF 100 MCG DAILY FOR 3 DAYS WEEKLY) 108 tablet 1   methylPREDNISolone (MEDROL DOSEPAK) 4 MG TBPK tablet Take per packet instructions 21 each 0   triamcinolone cream (KENALOG) 0.1 % Apply 1 Application topically 2 (two) times daily. As needed for rash 80 g 0   No current facility-administered medications for this visit.   No results found.  Review of Systems:   A ROS was performed including pertinent positives and negatives as documented in the HPI.  Physical Exam :   Constitutional: NAD and appears stated age Neurological: Alert and oriented Psych: Appropriate affect and cooperative Last menstrual period 10/01/2019.   Comprehensive Musculoskeletal Exam:    Left knee appears swollen compared to contralateral side.  There is a moderate to large effusion present of the left knee without overlying erythema.  Slight warmth to palpation.  Active range of motion from 0 to 100 degrees.  No pinpoint tenderness throughout the knee.  Imaging:    Assessment:   62 y.o. female here today with second episode of a left knee effusion.  She does have some known patellofemoral osteoarthritis however these effusions have occurred approximately 24 hours after taking her monthly osteoporosis medication.  Given this I  am in agreement that there is high suspicion for effusions being a medication side effect.  I did aspirate the left knee today and was able to remove 62 mL of synovial fluid.  Will plan to discontinue this medication and monitor for any repeat effusions in the future.  Should this return again could consider oral steroid versus cortisone injection or synovial fluid analysis.  Plan :    - Left knee aspirated today after patient consent - Follow up as needed     Procedure Note  Patient: Rebecca Cole  Date of Birth: 1961-05-19           MRN: [id number]             Visit Date: 06/21/2023  Procedures: Visit Diagnoses:  1. Effusion, left knee      Large Joint Inj: L knee on 06/21/2023 12:33 PM Indications: joint swelling Details: 18 G 1.5 in needle, superolateral approach Aspirate: 62 mL yellow and serous Outcome: tolerated well, no immediate complications Procedure, treatment alternatives, risks and benefits explained, specific risks discussed. Consent was given by the patient. Immediately prior to procedure a time out was called to verify the correct patient, procedure, equipment, support staff and site/side marked as required. Patient was prepped and draped in the usual sterile fashion.      I personally saw and evaluated the patient, and participated in the management and treatment plan.  Hazle Nordmann, PA-C Orthopedics

## 2023-06-25 NOTE — Telephone Encounter (Signed)
Error

## 2023-06-26 DIAGNOSIS — M818 Other osteoporosis without current pathological fracture: Secondary | ICD-10-CM | POA: Diagnosis not present

## 2023-07-17 DIAGNOSIS — E890 Postprocedural hypothyroidism: Secondary | ICD-10-CM | POA: Diagnosis not present

## 2023-07-17 DIAGNOSIS — M810 Age-related osteoporosis without current pathological fracture: Secondary | ICD-10-CM | POA: Diagnosis not present

## 2023-07-17 DIAGNOSIS — Z789 Other specified health status: Secondary | ICD-10-CM | POA: Diagnosis not present

## 2023-07-17 DIAGNOSIS — Z832 Family history of diseases of the blood and blood-forming organs and certain disorders involving the immune mechanism: Secondary | ICD-10-CM | POA: Diagnosis not present

## 2023-09-19 NOTE — Progress Notes (Signed)
 [facility] [address] [place] [address] Phone: [phone] Subjective:   Bruce Donath, am serving as a scribe for Dr. Antoine Primas. I'm seeing this patient by the request  of:  Sherlene Shams, MD  CC: Bilateral knee pain  ION:[id number]  01/28/2023 Seems to have responded extremely well at this moment. Has not had any significant pain. Hopeful that patient will do relatively well. Does show some scarring noted on the anterior aspect.   Significant improvement at this time. Discussed icing regimen of home exercises. Follow-up with me again as needed   Stable at the moment.  No significant changes in management follow-up with me as needed patient is wanting to consider PRP again in the knees before more traveling in the wintertime     Update 09/24/2023 Rebecca Cole is a 63 y.o. female coming in with complaint of B knee and R shoulder pain. Patient states that she doing well. Painful to go up and down stairs.   Wants to discuss treatments for osteroporosis. Diagnosed w osteopenia. Tried medication ibandrinate but this caused swelling in L knee.      Past Medical History:  Diagnosis Date   Graves Disease Sept 2010   s/p 131 ablation, managed by [facility] [facility] Dr. Theodora Blow' orbitopathy 2010   managed by [facility] L[facility] B[facility]   Past Surgical History:  Procedure Laterality Date   BREAST BIOPSY  2006   negative   BREAST CYST EXCISION Right 2006   Social History   Socioeconomic History   Marital status: Single    Spouse name: Not on file   Number of children: Not on file   Years of education: Not on file   Highest education level: Not on file  Occupational History   Occupation: full-time air traffic controller    Employer: [employer]  Tobacco Use   Smoking status: Never   Smokeless tobacco: Never  Vaping Use   Vaping status: Never Used  Substance and Sexual Activity   Alcohol use: Yes    Comment:  3-4 a month   Drug use: No   Sexual activity: Not Currently    Birth control/protection: Post-menopausal, Abstinence  Other Topics Concern   Not on file  Social History Narrative   Full-time air traffic controller, has Education officer, community.   Has no pets   Social Drivers of Corporate investment banker Strain: Low Risk  (06/19/2023)   Received from [facility]   Overall Financial Resource Strain (CARDIA)    Difficulty of Paying Living Expenses: Not hard at all  Food Insecurity: No Food Insecurity (06/19/2023)   Received from [facility]   Hunger Vital Sign    Worried About Running Out of Food in the Last Year: Never true    Ran Out of Food in the Last Year: Never true  Transportation Needs: No Transportation Needs (06/19/2023)   Received from [facility] - [facility]    In the past 12 months, has lack of transportation kept you from medical appointments or from getting medications?: No    Lack of Transportation (Non-Medical): No  Physical Activity: Not on file  Stress: Not on file  Social Connections: Not on file   Allergies  Allergen Reactions   Methimazole    Family History  Problem Relation Age of Onset   Dementia Mother    Heart disease Father  CABG, Ablation for Atrial Flutter, valve replacement, pacemaker, pericardial effusion s/p window   Cancer Father        late 50's history of colon CA   Cancer Maternal Grandmother        possibly benign   Breast cancer Maternal Grandmother     Current Outpatient Medications (Endocrine & Metabolic):    alendronate (FOSAMAX) 70 MG tablet, Take 1 tablet (70 mg total) by mouth once a week. Take with a full glass of water on an empty stomach.   levothyroxine (SYNTHROID) 100 MCG tablet, TAKE 1 TABLET BY MOUTH EVERY DAY BEFORE BREAKFAST   levothyroxine (SYNTHROID) 88 MCG tablet, TAKE ONE TABLET 4 DAYS PER WEEK (AND 1 TABLET OF 100 MCG DAILY FOR 3 DAYS WEEKLY)    methylPREDNISolone (MEDROL DOSEPAK) 4 MG TBPK tablet, Take per packet instructions  Current Outpatient Medications (Cardiovascular):    EPINEPHrine 0.3 mg/0.3 mL IJ SOAJ injection, Inject 0.3 mg into the muscle as needed for anaphylaxis.  Current Outpatient Medications (Respiratory):    fluticasone (FLONASE) 50 MCG/ACT nasal spray, Place 2 sprays into both nostrils daily.    Current Outpatient Medications (Other):    Cholecalciferol (VITAMIN D3) 1000 UNITS CAPS, Take 1 capsule by mouth daily.   doxycycline (VIBRA-TABS) 100 MG tablet, Take 1 tablet (100 mg total) by mouth 2 (two) times daily.   ketoconazole (NIZORAL) 2 % cream, Apply 1 Application topically daily.   triamcinolone cream (KENALOG) 0.1 %, Apply 1 Application topically 2 (two) times daily. As needed for rash    Objective  Blood pressure 102/76, pulse 65, height 5\' 8"  (1.727 m), weight 136 lb (61.7 kg), last menstrual period 10/01/2019, SpO2 98%.   General: No apparent distress alert and oriented x3 mood and affect normal, dressed appropriately.   After informed written and verbal consent, patient was seated on exam table. Right knee was prepped with alcohol swab and utilizing anterolateral approach, patient's right knee space was injected with 1 cc of 0.5% Marcaine and then 5 cc of PRP. Patient tolerated the procedure well without immediate complications.  After informed written and verbal consent, patient was seated on exam table. Left knee was prepped with alcohol swab and utilizing anterolateral approach, patient's left knee space was injected 3 cc of 0.5% Marcaine then 5 cc of PRP  Patient tolerated the procedure well without immediate complications.    Impression and Recommendations:     The above documentation has been reviewed and is accurate and complete Judi Saa, DO

## 2023-09-20 ENCOUNTER — Encounter: Payer: Self-pay | Admitting: Family Medicine

## 2023-09-24 ENCOUNTER — Encounter: Payer: Self-pay | Admitting: Family Medicine

## 2023-09-24 ENCOUNTER — Ambulatory Visit (INDEPENDENT_AMBULATORY_CARE_PROVIDER_SITE_OTHER): Payer: Self-pay | Admitting: Family Medicine

## 2023-09-24 VITALS — BP 102/76 | HR 65 | Ht 68.0 in | Wt 136.0 lb

## 2023-09-24 DIAGNOSIS — M810 Age-related osteoporosis without current pathological fracture: Secondary | ICD-10-CM

## 2023-09-24 DIAGNOSIS — M1710 Unilateral primary osteoarthritis, unspecified knee: Secondary | ICD-10-CM

## 2023-09-24 MED ORDER — ALENDRONATE SODIUM 70 MG PO TABS: 70.0000 mg | ORAL_TABLET | ORAL | 0 refills | Status: DC

## 2023-09-24 NOTE — Assessment & Plan Note (Signed)
 Nearly 1 year of improvement.  Hopefully this will continue to do well.  Follow-up with me again after the post PRP regimen in 6 weeks

## 2023-09-24 NOTE — Assessment & Plan Note (Signed)
 Discussed with patient about different medications and discussed potential Fosamax.

## 2023-09-24 NOTE — Patient Instructions (Addendum)
 B PRP today Vit D 2000IU daily K2 daily Cal-mag-phos OTC medication Fosamax  Huel vibrating plate No ice or IBU for 3 days Heat and Tylenol are ok See me again in 6 weeks

## 2023-11-11 ENCOUNTER — Ambulatory Visit: Payer: Federal, State, Local not specified - PPO | Admitting: Family Medicine

## 2023-12-15 ENCOUNTER — Other Ambulatory Visit: Payer: Self-pay | Admitting: Family Medicine

## 2023-12-17 NOTE — Progress Notes (Unsigned)
 [facility] [address] [place] [address] Phone: [phone] Subjective:   Rebecca Cole, am serving as a scribe for Dr. Ronnell Coins.  I'm seeing this patient by the request  of:  Thersia Flax, MD  CC: Left knee pain, right shoulder pain  [id number]  09/24/2023 Discussed with patient about different medications and discussed potential Fosamax .     Nearly 1 year of improvement.  Hopefully this will continue to do well.  Follow-up with me again after the post PRP regimen in 6 weeks      Update 12/18/2023 Rebecca Cole is a 63 y.o. female coming in with complaint of L knee pain. Patient states that she is doing a lot better. Painful to go down steep stairs but no pain with activity. Unable to pop up from seated position.   Also states that R shoulder continues to bother her with ER. She pulled on her sail and had some sharp pain.       Past Medical History:  Diagnosis Date   Graves Disease Sept 2010   s/p 131 ablation, managed by [place] [facility] Dr. Monta Anton' orbitopathy 2010   managed by [facility] Lela Purple   Past Surgical History:  Procedure Laterality Date   BREAST BIOPSY  2006   negative   BREAST CYST EXCISION Right 2006   Social History   Socioeconomic History   Marital status: Single    Spouse name: Not on file   Number of children: Not on file   Years of education: Not on file   Highest education level: Not on file  Occupational History   Occupation: full-time air traffic controller    Employer: [employer]  Tobacco Use   Smoking status: Never   Smokeless tobacco: Never  Vaping Use   Vaping status: Never Used  Substance and Sexual Activity   Alcohol use: Yes    Comment: 3-4 a month   Drug use: No   Sexual activity: Not Currently    Birth control/protection: Post-menopausal, Abstinence  Other Topics Concern   Not on file  Social History Narrative   Full-time air traffic  controller, has Education officer, community.   Has no pets   Social Drivers of Corporate investment banker Strain: Low Risk  (06/19/2023)   Received from [facility]   Overall Financial Resource Strain (CARDIA)    Difficulty of Paying Living Expenses: Not hard at all  Food Insecurity: No Food Insecurity (06/19/2023)   Received from [facility]   Hunger Vital Sign    Worried About Running Out of Food in the Last Year: Never true    Ran Out of Food in the Last Year: Never true  Transportation Needs: No Transportation Needs (06/19/2023)   Received from [facility] - Transportation    In the past 12 months, has lack of transportation kept you from medical appointments or from getting medications?: No    Lack of Transportation (Non-Medical): No  Physical Activity: Not on file  Stress: Not on file  Social Connections: Not on file   Allergies  Allergen Reactions   Methimazole    Family History  Problem Relation Age of Onset   Dementia Mother    Heart disease Father        CABG, Ablation for Atrial Flutter, valve replacement, pacemaker, pericardial effusion s/p window   Cancer Father  late 50's history of colon CA   Cancer Maternal Grandmother        possibly benign   Breast cancer Maternal Grandmother     Current Outpatient Medications (Endocrine & Metabolic):    alendronate  (FOSAMAX ) 70 MG tablet, TAKE 1 TABLET BY MOUTH ONCE A WEEK. TAKE WITH A FULL GLASS OF WATER ON AN EMPTY STOMACH.   levothyroxine  (SYNTHROID ) 100 MCG tablet, TAKE 1 TABLET BY MOUTH EVERY DAY BEFORE BREAKFAST   levothyroxine  (SYNTHROID ) 88 MCG tablet, TAKE ONE TABLET 4 DAYS PER WEEK (AND 1 TABLET OF 100 MCG DAILY FOR 3 DAYS WEEKLY)   methylPREDNISolone  (MEDROL  DOSEPAK) 4 MG TBPK tablet, Take per packet instructions  Current Outpatient Medications (Cardiovascular):    EPINEPHrine  0.3 mg/0.3 mL IJ SOAJ injection, Inject 0.3 mg into the muscle as needed for  anaphylaxis.  Current Outpatient Medications (Respiratory):    fluticasone  (FLONASE ) 50 MCG/ACT nasal spray, Place 2 sprays into both nostrils daily.    Current Outpatient Medications (Other):    Cholecalciferol (VITAMIN D3) 1000 UNITS CAPS, Take 1 capsule by mouth daily.   doxycycline  (VIBRA -TABS) 100 MG tablet, Take 1 tablet (100 mg total) by mouth 2 (two) times daily.   ketoconazole  (NIZORAL ) 2 % cream, Apply 1 Application topically daily.   triamcinolone  cream (KENALOG ) 0.1 %, Apply 1 Application topically 2 (two) times daily. As needed for rash   Reviewed prior external information including notes and imaging from  primary care provider As well as notes that were available from care everywhere and other healthcare systems.  Past medical history, social, surgical and family history all reviewed in electronic medical record.  No pertanent information unless stated regarding to the chief complaint.   Review of Systems:  No headache, visual changes, nausea, vomiting, diarrhea, constipation, dizziness, abdominal pain, skin rash, fevers, chills, night sweats, weight loss, swollen lymph nodes, body aches, joint swelling, chest pain, shortness of breath, mood changes. POSITIVE muscle aches  Objective  Blood pressure 110/70, height 5\' 8"  (1.727 m), weight 137 lb (62.1 kg), last menstrual period 10/01/2019.   General: No apparent distress alert and oriented x3 mood and affect normal, dressed appropriately.  HEENT: Pupils equal, extraocular movements intact  Respiratory: Patient's speak in full sentences and does not appear short of breath  Cardiovascular: No lower extremity edema, non tender, no erythema  Deferred knee exam. Right shoulder exam has great range of motion.  Some weakness noted though with resisted external rotation but otherwise full strength and full range of motion.  Nontender on palpation noted today.  Limited muscular skeletal ultrasound was performed and interpreted by  Ronnell Coins, M  Limited ultrasound shows that there is some hypoechoic changes noted. Patient does not calcific changes noted the posterior labrum but no significant displacement noted.  No hypoechoic changes of the glenohumeral joint or of the rotator cuff itself.  Very mild intersubstance tearing noted of the supraspinatus but no retraction.  Bicep tendon unremarkable. Impression: Interval improvement   Impression and Recommendations:     The above documentation has been reviewed and is accurate and complete Tycho Cheramie M Stassi Fadely, DO

## 2023-12-18 ENCOUNTER — Ambulatory Visit: Admitting: Family Medicine

## 2023-12-18 ENCOUNTER — Other Ambulatory Visit: Payer: Self-pay

## 2023-12-18 ENCOUNTER — Encounter: Payer: Self-pay | Admitting: Family Medicine

## 2023-12-18 VITALS — BP 110/70 | Ht 68.0 in | Wt 137.0 lb

## 2023-12-18 DIAGNOSIS — M25562 Pain in left knee: Secondary | ICD-10-CM | POA: Diagnosis not present

## 2023-12-18 DIAGNOSIS — G8929 Other chronic pain: Secondary | ICD-10-CM

## 2023-12-18 DIAGNOSIS — M19011 Primary osteoarthritis, right shoulder: Secondary | ICD-10-CM | POA: Diagnosis not present

## 2023-12-18 DIAGNOSIS — S43431A Superior glenoid labrum lesion of right shoulder, initial encounter: Secondary | ICD-10-CM | POA: Diagnosis not present

## 2023-12-18 DIAGNOSIS — M810 Age-related osteoporosis without current pathological fracture: Secondary | ICD-10-CM

## 2023-12-18 NOTE — Assessment & Plan Note (Signed)
 Patient is doing very well with no significant swelling noted.  We discussed icing regimen and home exercises, discussed which activities to do and which ones to avoid.  Increase activity slowly.  Discussed icing regimen.  Follow-up again in  8 weeks otherwise.  I believe patient will do well with conservative therapy no worsening pain can consider another injection if needed

## 2023-12-18 NOTE — Assessment & Plan Note (Signed)
 Seems to be doing well overall.  No other significant changes in management at the moment.  Patient is going to start doing more activity and will call us  if any worsening discomfort occurs.

## 2023-12-18 NOTE — Patient Instructions (Signed)
 Good luck with the home improvement See me again in 2 months if worsening  Recheck DEXA in 1 year

## 2023-12-18 NOTE — Assessment & Plan Note (Signed)
 Patient does have osteoporosis and is on the Fosamax .  Will retest the bone density in 1 year.

## 2024-01-06 ENCOUNTER — Encounter: Payer: Self-pay | Admitting: Internal Medicine

## 2024-01-09 ENCOUNTER — Encounter: Payer: Federal, State, Local not specified - PPO | Admitting: Internal Medicine

## 2024-01-09 ENCOUNTER — Encounter: Payer: Self-pay | Admitting: Internal Medicine

## 2024-01-09 ENCOUNTER — Ambulatory Visit (INDEPENDENT_AMBULATORY_CARE_PROVIDER_SITE_OTHER): Admitting: Internal Medicine

## 2024-01-09 VITALS — BP 124/80 | HR 58 | Temp 97.7°F | Ht 68.0 in | Wt 136.0 lb

## 2024-01-09 DIAGNOSIS — Z0000 Encounter for general adult medical examination without abnormal findings: Secondary | ICD-10-CM | POA: Diagnosis not present

## 2024-01-09 DIAGNOSIS — E063 Autoimmune thyroiditis: Secondary | ICD-10-CM | POA: Diagnosis not present

## 2024-01-09 DIAGNOSIS — M818 Other osteoporosis without current pathological fracture: Secondary | ICD-10-CM

## 2024-01-09 DIAGNOSIS — E785 Hyperlipidemia, unspecified: Secondary | ICD-10-CM | POA: Diagnosis not present

## 2024-01-09 DIAGNOSIS — M19011 Primary osteoarthritis, right shoulder: Secondary | ICD-10-CM

## 2024-01-09 DIAGNOSIS — E559 Vitamin D deficiency, unspecified: Secondary | ICD-10-CM | POA: Diagnosis not present

## 2024-01-09 DIAGNOSIS — E538 Deficiency of other specified B group vitamins: Secondary | ICD-10-CM | POA: Diagnosis not present

## 2024-01-09 DIAGNOSIS — E349 Endocrine disorder, unspecified: Secondary | ICD-10-CM

## 2024-01-09 DIAGNOSIS — R7301 Impaired fasting glucose: Secondary | ICD-10-CM | POA: Diagnosis not present

## 2024-01-09 LAB — CBC WITH DIFFERENTIAL/PLATELET
Basophils Absolute: 0 10*3/uL (ref 0.0–0.1)
Basophils Relative: 1 % (ref 0.0–3.0)
Eosinophils Absolute: 0.1 10*3/uL (ref 0.0–0.7)
Eosinophils Relative: 2.7 % (ref 0.0–5.0)
HCT: 36.6 % (ref 36.0–46.0)
Hemoglobin: 12.1 g/dL (ref 12.0–15.0)
Lymphocytes Relative: 31.4 % (ref 12.0–46.0)
Lymphs Abs: 1 10*3/uL (ref 0.7–4.0)
MCHC: 33.1 g/dL (ref 30.0–36.0)
MCV: 90.6 fl (ref 78.0–100.0)
Monocytes Absolute: 0.2 10*3/uL (ref 0.1–1.0)
Monocytes Relative: 7.5 % (ref 3.0–12.0)
Neutro Abs: 1.9 10*3/uL (ref 1.4–7.7)
Neutrophils Relative %: 57.4 % (ref 43.0–77.0)
Platelets: 286 10*3/uL (ref 150.0–400.0)
RBC: 4.04 Mil/uL (ref 3.87–5.11)
RDW: 13.5 % (ref 11.5–15.5)
WBC: 3.3 10*3/uL — ABNORMAL LOW (ref 4.0–10.5)

## 2024-01-09 LAB — COMPREHENSIVE METABOLIC PANEL WITH GFR
ALT: 18 U/L (ref 0–35)
AST: 20 U/L (ref 0–37)
Albumin: 4.5 g/dL (ref 3.5–5.2)
Alkaline Phosphatase: 68 U/L (ref 39–117)
BUN: 14 mg/dL (ref 6–23)
CO2: 28 meq/L (ref 19–32)
Calcium: 9.4 mg/dL (ref 8.4–10.5)
Chloride: 104 meq/L (ref 96–112)
Creatinine, Ser: 0.78 mg/dL (ref 0.40–1.20)
GFR: 81.91 mL/min (ref >60.00)
Glucose, Bld: 96 mg/dL (ref 70–99)
Potassium: 4.4 meq/L (ref 3.5–5.1)
Sodium: 142 meq/L (ref 135–145)
Total Bilirubin: 0.3 mg/dL (ref 0.2–1.2)
Total Protein: 6.5 g/dL (ref 6.0–8.3)

## 2024-01-09 LAB — TSH: TSH: 1.77 u[IU]/mL (ref 0.35–5.50)

## 2024-01-09 LAB — LIPID PANEL
Cholesterol: 197 mg/dL (ref 0–200)
HDL: 61.2 mg/dL (ref >39.00)
LDL Cholesterol: 119 mg/dL — ABNORMAL HIGH (ref 0–99)
NonHDL: 135.45
Total CHOL/HDL Ratio: 3
Triglycerides: 80 mg/dL (ref 0.0–149.0)
VLDL: 16 mg/dL (ref 0.0–40.0)

## 2024-01-09 LAB — LDL CHOLESTEROL, DIRECT: Direct LDL: 117 mg/dL

## 2024-01-09 LAB — HEMOGLOBIN A1C: Hgb A1c MFr Bld: 6.1 % (ref 4.6–6.5)

## 2024-01-09 LAB — B12 AND FOLATE PANEL
Folate: 16.6 ng/mL (ref >5.9)
Vitamin B-12: 618 pg/mL (ref 211–911)

## 2024-01-09 LAB — VITAMIN D 25 HYDROXY (VIT D DEFICIENCY, FRACTURES): VITD: 50.57 ng/mL (ref 30.00–100.00)

## 2024-01-09 MED ORDER — EPINEPHRINE 0.3 MG/0.3ML IJ SOAJ: 0.3000 mg | INTRAMUSCULAR | 1 refills | Status: AC | PRN

## 2024-01-09 MED ORDER — KETOCONAZOLE 2 % EX CREA: 1.0000 | TOPICAL_CREAM | Freq: Every day | CUTANEOUS | 1 refills | Status: AC

## 2024-01-09 NOTE — Assessment & Plan Note (Signed)
 Taking 1000 ius  every other day

## 2024-01-09 NOTE — Progress Notes (Unsigned)
 Patient ID: Rebecca Cole, female    DOB: 05/25/1961  Age: 63 y.o. MRN: [id number]  The patient is here for annual preventive examination and management of other chronic and acute problems.   The risk factors are reflected in the social history.   The roster of all physicians providing medical care to patient - is listed in the Snapshot section of the chart.   Activities of daily living:  The patient is 100% independent in all ADLs: dressing, toileting, feeding as well as independent mobility   Home safety : The patient has smoke detectors in the home. They wear seatbelts.  There are no unsecured firearms at home. There is no violence in the home.    There is no risks for hepatitis, STDs or HIV. There is no   history of blood transfusion. They have no travel history to infectious disease endemic areas of the world.   The patient has seen their dentist in the last six month. They have seen their eye doctor in the last year. The patinet  denies slight hearing difficulty with regard to whispered voices and some television programs.  They have deferred audiologic testing in the last year.  They do not  have excessive sun exposure. Discussed the need for sun protection: hats, long sleeves and use of sunscreen if there is significant sun exposure.    Diet: the importance of a healthy diet is discussed. They do have a healthy diet.   The benefits of regular aerobic exercise were discussed. The patient  exercises  3 to 5 days per week  for  60 minutes.    Depression screen: there are no signs or vegative symptoms of depression- irritability, change in appetite, anhedonia, sadness/tearfullness.   The following portions of the patient's history were reviewed and updated as appropriate: allergies, current medications, past family history, past medical history,  past surgical history, past social history  and problem list.   Visual acuity was not assessed per patient preference since the patient has  regular follow up with an  ophthalmologist. Hearing and body mass index were assessed and reviewed.    During the course of the visit the patient was educated and counseled about appropriate screening and preventive services including : fall prevention , diabetes screening, nutrition counseling, colorectal cancer screening, and recommended immunizations.    Chief Complaint:  Was Seeing [facility] for management of Graves hypothyroidism and osteoporosis.  Now takin NB Synthroid   100 mch 6 dasy per week.  TSH due  Seeing Adell Hones Rivard in [place] for GYN.  DEXA done  , she has osteoporosis T Score -3.0 in the spine.  Options limited due to having heterozygous  Factor V leiden test  She  did not tolerate monthly Boniva due to  mood changes,  bone pain and   recurrent episodes of large joint swelling.  All symptoms resolved withuse of Tums and weekly alendronate  suggested by DR Felipe Horton during shoulder evaluation  and use of  mixed magnesium  supplements at night with marked improvement in energy, motivation and sleep   .   Review of Symptoms  Patient denies headache, fevers, malaise, unintentional weight loss, skin rash, eye pain, sinus congestion and sinus pain, sore throat, dysphagia,  hemoptysis , cough, dyspnea, wheezing, chest pain, palpitations, orthopnea, edema, abdominal pain, nausea, melena, diarrhea, constipation, flank pain, dysuria, hematuria, urinary  Frequency, nocturia, numbness, tingling, seizures,  Focal weakness, Loss of consciousness,  Tremor, insomnia, depression, anxiety, and suicidal ideation.  Physical Exam:  Ht 5\' 8"  (1.727 m)   Wt 136 lb (61.7 kg)   LMP 10/01/2019   BMI 20.68 kg/m    Physical Exam  Assessment and Plan: Hypothyroidism, acquired, autoimmune  Impaired fasting glucose  Encounter for preventive health examination  Dyslipidemia  B12 deficiency    No follow-ups on file.  Thersia Flax, MD

## 2024-01-09 NOTE — Assessment & Plan Note (Signed)
Previous A1c was 6.5,  but using fructosamine conversion,  6.25 .   She is following a low glycemic index diet and exercising .  Repeat A1c is lower /fructosamine pending   Lab Results  Component Value Date   HGBA1C 6.0 01/07/2023

## 2024-01-09 NOTE — Assessment & Plan Note (Signed)
 Tolerating alendronate  weekly with addition of Tums and magnesium

## 2024-01-09 NOTE — Assessment & Plan Note (Addendum)
 Previously mannaged by [facility] with name brand Synthroid   100 mcg x 6 days per week.  TSH due   Lab Results  Component Value Date   TSH 0.21 (L) 04/30/2023

## 2024-01-09 NOTE — Assessment & Plan Note (Signed)

## 2024-01-10 ENCOUNTER — Ambulatory Visit: Payer: Self-pay | Admitting: Internal Medicine

## 2024-01-10 DIAGNOSIS — R7301 Impaired fasting glucose: Secondary | ICD-10-CM

## 2024-01-10 NOTE — Assessment & Plan Note (Signed)
 Managed conservatively by sports medicine with periodic I/A injections

## 2024-01-11 ENCOUNTER — Other Ambulatory Visit: Payer: Self-pay | Admitting: Internal Medicine

## 2024-01-11 LAB — FRUCTOSAMINE: Fructosamine: 253 umol/L (ref 205–285)

## 2024-01-11 MED ORDER — LEVOTHYROXINE SODIUM 100 MCG PO TABS: ORAL_TABLET | ORAL | 2 refills | Status: DC

## 2024-01-11 MED ORDER — LEVOTHYROXINE SODIUM 100 MCG PO TABS: ORAL_TABLET | ORAL | 2 refills | Status: AC

## 2024-01-14 NOTE — Assessment & Plan Note (Signed)
 A1c is 6.9 using the fructosamine conversion

## 2024-01-22 ENCOUNTER — Other Ambulatory Visit: Payer: Self-pay | Admitting: Obstetrics and Gynecology

## 2024-01-22 DIAGNOSIS — Z1231 Encounter for screening mammogram for malignant neoplasm of breast: Secondary | ICD-10-CM

## 2024-01-28 DIAGNOSIS — H903 Sensorineural hearing loss, bilateral: Secondary | ICD-10-CM | POA: Diagnosis not present

## 2024-01-28 DIAGNOSIS — H9319 Tinnitus, unspecified ear: Secondary | ICD-10-CM | POA: Diagnosis not present

## 2024-01-28 DIAGNOSIS — J301 Allergic rhinitis due to pollen: Secondary | ICD-10-CM | POA: Diagnosis not present

## 2024-01-28 DIAGNOSIS — H90A22 Sensorineural hearing loss, unilateral, left ear, with restricted hearing on the contralateral side: Secondary | ICD-10-CM | POA: Diagnosis not present

## 2024-02-06 NOTE — Progress Notes (Deleted)
 [facility] [address] [place] [address] Phone: [phone] Subjective:    I'm seeing this patient by the request  of:  Marylynn Verneita CROME, MD  CC:   YEP:[id number]  12/18/2023 Seems to be doing well overall. No other significant changes in management at the moment. Patient is going to start doing more activity and will call us  if any worsening discomfort occurs.   Patient does have osteoporosis and is on the Fosamax .  Will retest the bone density in 1 year     Patient is doing very well with no significant swelling noted. We discussed icing regimen and home exercises, discussed which activities to do and which ones to avoid. Increase activity slowly. Discussed icing regimen. Follow-up again in 8 weeks otherwise. I believe patient will do well with conservative therapy no worsening pain can consider another injection if needed   Update 02/18/2024 Rebecca Cole is a 63 y.o. female coming in with complaint of R shoulder and L knee pain. Patient states        Past Medical History:  Diagnosis Date   Graves Disease Sept 2010   s/p 131 ablation, managed by [place] [facility] Dr. Adel Gavel' orbitopathy 2010   managed by [facility] Dorn Le[facility]   Past Surgical History:  Procedure Laterality Date   BREAST BIOPSY  2006   negative   BREAST CYST EXCISION Right 2006   Social History   Socioeconomic History   Marital status: Single    Spouse name: Not on file   Number of children: Not on file   Years of education: Not on file   Highest education level: Not on file  Occupational History   Occupation: full-time air traffic controller    Employer: [employer]  Tobacco Use   Smoking status: Never   Smokeless tobacco: Never  Vaping Use   Vaping status: Never Used  Substance and Sexual Activity   Alcohol use: Yes    Comment: 3-4 a month   Drug use: No   Sexual activity: Not Currently    Birth control/protection:  Post-menopausal, Abstinence  Other Topics Concern   Not on file  Social History Narrative   Full-time air traffic controller, has Education officer, community.   Has no pets   Social Drivers of Corporate investment banker Strain: Low Risk  (06/19/2023)   Received from [facility]   Overall Financial Resource Strain (CARDIA)    Difficulty of Paying Living Expenses: Not hard at all  Food Insecurity: No Food Insecurity (06/19/2023)   Received from [facility]   Hunger Vital Sign    Within the past 12 months, you worried that your food would run out before you got the money to buy more.: Never true    Within the past 12 months, the food you bought just didn't last and you didn't have money to get more.: Never true  Transportation Needs: No Transportation Needs (06/19/2023)   Received from [facility] - Transportation    In the past 12 months, has lack of transportation kept you from medical appointments or from getting medications?: No    Lack of Transportation (Non-Medical): No  Physical Activity: Not on file  Stress: Not on file  Social Connections: Not on file   Allergies  Allergen Reactions   Methimazole    Family History  Problem Relation Age of Onset   Dementia Mother  Heart disease Father        CABG, Ablation for Atrial Flutter, valve replacement, pacemaker, pericardial effusion s/p window   Cancer Father        late 50's history of colon CA   Cancer Maternal Grandmother        possibly benign   Breast cancer Maternal Grandmother     Current Outpatient Medications (Endocrine & Metabolic):    alendronate  (FOSAMAX ) 70 MG tablet, TAKE 1 TABLET BY MOUTH ONCE A WEEK. TAKE WITH A FULL GLASS OF WATER ON AN EMPTY STOMACH.   levothyroxine  (SYNTHROID ) 100 MCG tablet, TAKE 1 TABLET BY MOUTH EVERY DAY BEFORE BREAKFAST   methylPREDNISolone  (MEDROL  DOSEPAK) 4 MG TBPK tablet, Take per packet instructions (Patient not taking:  Reported on 01/09/2024)  Current Outpatient Medications (Cardiovascular):    EPINEPHrine  0.3 mg/0.3 mL IJ SOAJ injection, Inject 0.3 mg into the muscle as needed for anaphylaxis.  Current Outpatient Medications (Respiratory):    fluticasone  (FLONASE ) 50 MCG/ACT nasal spray, Place 2 sprays into both nostrils daily.    Current Outpatient Medications (Other):    Cholecalciferol (VITAMIN D3) 1000 UNITS CAPS, Take 1 capsule by mouth daily.   doxycycline  (VIBRA -TABS) 100 MG tablet, Take 1 tablet (100 mg total) by mouth 2 (two) times daily.   ketoconazole  (NIZORAL ) 2 % cream, Apply 1 Application topically daily.   triamcinolone  cream (KENALOG ) 0.1 %, Apply 1 Application topically 2 (two) times daily. As needed for rash   Reviewed prior external information including notes and imaging from  primary care provider As well as notes that were available from care everywhere and other healthcare systems.  Past medical history, social, surgical and family history all reviewed in electronic medical record.  No pertanent information unless stated regarding to the chief complaint.   Review of Systems:  No headache, visual changes, nausea, vomiting, diarrhea, constipation, dizziness, abdominal pain, skin rash, fevers, chills, night sweats, weight loss, swollen lymph nodes, body aches, joint swelling, chest pain, shortness of breath, mood changes. POSITIVE muscle aches  Objective  Last menstrual period 10/01/2019.   General: No apparent distress alert and oriented x3 mood and affect normal, dressed appropriately.  HEENT: Pupils equal, extraocular movements intact  Respiratory: Patient's speak in full sentences and does not appear short of breath  Cardiovascular: No lower extremity edema, non tender, no erythema      Impression and Recommendations:

## 2024-02-18 ENCOUNTER — Ambulatory Visit: Admitting: Family Medicine

## 2024-03-10 ENCOUNTER — Other Ambulatory Visit: Payer: Self-pay | Admitting: Family Medicine

## 2024-03-17 DIAGNOSIS — H90A22 Sensorineural hearing loss, unilateral, left ear, with restricted hearing on the contralateral side: Secondary | ICD-10-CM | POA: Diagnosis not present

## 2024-03-17 DIAGNOSIS — H903 Sensorineural hearing loss, bilateral: Secondary | ICD-10-CM | POA: Diagnosis not present

## 2024-04-02 DIAGNOSIS — Z1330 Encounter for screening examination for mental health and behavioral disorders, unspecified: Secondary | ICD-10-CM | POA: Diagnosis not present

## 2024-04-02 DIAGNOSIS — Z01419 Encounter for gynecological examination (general) (routine) without abnormal findings: Secondary | ICD-10-CM | POA: Diagnosis not present

## 2024-05-05 ENCOUNTER — Ambulatory Visit
Admission: RE | Admit: 2024-05-05 | Discharge: 2024-05-05 | Disposition: A | Discharge status: Home / Self Care | Source: Ambulatory Visit | Attending: Obstetrics and Gynecology | Admitting: Obstetrics and Gynecology

## 2024-05-05 DIAGNOSIS — Z1231 Encounter for screening mammogram for malignant neoplasm of breast: Secondary | ICD-10-CM | POA: Diagnosis not present

## 2024-06-04 ENCOUNTER — Other Ambulatory Visit: Payer: Self-pay | Admitting: Family Medicine

## 2024-06-08 ENCOUNTER — Encounter: Payer: Self-pay | Admitting: Radiology

## 2024-07-21 DIAGNOSIS — L814 Other melanin hyperpigmentation: Secondary | ICD-10-CM | POA: Diagnosis not present

## 2024-07-21 DIAGNOSIS — L578 Other skin changes due to chronic exposure to nonionizing radiation: Secondary | ICD-10-CM | POA: Diagnosis not present

## 2024-07-21 DIAGNOSIS — L821 Other seborrheic keratosis: Secondary | ICD-10-CM | POA: Diagnosis not present

## 2024-07-21 DIAGNOSIS — D485 Neoplasm of uncertain behavior of skin: Secondary | ICD-10-CM | POA: Diagnosis not present

## 2024-07-21 DIAGNOSIS — D225 Melanocytic nevi of trunk: Secondary | ICD-10-CM | POA: Diagnosis not present

## 2024-07-21 DIAGNOSIS — D1801 Hemangioma of skin and subcutaneous tissue: Secondary | ICD-10-CM | POA: Diagnosis not present

## 2024-08-20 ENCOUNTER — Telehealth: Payer: Self-pay

## 2024-08-20 DIAGNOSIS — R7301 Impaired fasting glucose: Secondary | ICD-10-CM

## 2024-08-20 DIAGNOSIS — R5383 Other fatigue: Secondary | ICD-10-CM

## 2024-08-20 DIAGNOSIS — E063 Autoimmune thyroiditis: Secondary | ICD-10-CM

## 2024-08-20 NOTE — Telephone Encounter (Signed)
 Copied from CRM #[id number]. Topic: Clinical - Request for Lab/Test Order >> Aug 20, 2024 10:48 AM Rea BROCKS wrote: Reason for CRM: Patient would like to have her Tsh checked. There's no lab in chart.    [phone] (M)

## 2024-08-21 NOTE — Addendum Note (Signed)
 Addended by: MARYLYNN VERNEITA CROME on: 08/21/2024 12:30 PM   Modules accepted: Orders

## 2024-08-21 NOTE — Addendum Note (Signed)
 Addended by: SEBASTIAN KNEE on: 08/21/2024 10:28 AM   Modules accepted: Orders

## 2024-08-21 NOTE — Telephone Encounter (Signed)
 Called and spoke with pt, she is reporting symptoms of fatigue and has gained 14 lbs with no change to diet.    Last TSH 01/09/24 1/77 and she is on levothyroxine  100mcg.    TSH lab pended for signature.  Unsure if there are any other labs that you would like to add.

## 2024-08-26 NOTE — Telephone Encounter (Signed)
Spoke with pt and scheduled her for a lab appt.  

## 2024-08-27 ENCOUNTER — Ambulatory Visit: Payer: Self-pay | Admitting: Internal Medicine

## 2024-08-27 ENCOUNTER — Other Ambulatory Visit (INDEPENDENT_AMBULATORY_CARE_PROVIDER_SITE_OTHER)

## 2024-08-27 DIAGNOSIS — R5383 Other fatigue: Secondary | ICD-10-CM

## 2024-08-27 DIAGNOSIS — R7301 Impaired fasting glucose: Secondary | ICD-10-CM

## 2024-08-27 DIAGNOSIS — E063 Autoimmune thyroiditis: Secondary | ICD-10-CM

## 2024-08-27 LAB — COMPREHENSIVE METABOLIC PANEL WITH GFR
ALT: 27 U/L (ref 3–35)
AST: 20 U/L (ref 5–37)
Albumin: 4.4 g/dL (ref 3.5–5.2)
Alkaline Phosphatase: 58 U/L (ref 39–117)
BUN: 17 mg/dL (ref 6–23)
CO2: 32 meq/L (ref 19–32)
Calcium: 9.6 mg/dL (ref 8.4–10.5)
Chloride: 104 meq/L (ref 96–112)
Creatinine, Ser: 0.81 mg/dL (ref 0.40–1.20)
GFR: 77.94 mL/min
Glucose, Bld: 103 mg/dL — ABNORMAL HIGH (ref 70–99)
Potassium: 5.1 meq/L (ref 3.5–5.1)
Sodium: 141 meq/L (ref 135–145)
Total Bilirubin: 0.5 mg/dL (ref 0.2–1.2)
Total Protein: 6.6 g/dL (ref 6.0–8.3)

## 2024-08-27 LAB — CBC WITH DIFFERENTIAL/PLATELET
Basophils Absolute: 0 K/uL (ref 0.0–0.1)
Basophils Relative: 1.3 % (ref 0.0–3.0)
Eosinophils Absolute: 0.1 K/uL (ref 0.0–0.7)
Eosinophils Relative: 3 % (ref 0.0–5.0)
HCT: 36.7 % (ref 36.0–46.0)
Hemoglobin: 12.5 g/dL (ref 12.0–15.0)
Lymphocytes Relative: 33.4 % (ref 12.0–46.0)
Lymphs Abs: 1.1 K/uL (ref 0.7–4.0)
MCHC: 34 g/dL (ref 30.0–36.0)
MCV: 91.1 fl (ref 78.0–100.0)
Monocytes Absolute: 0.2 K/uL (ref 0.1–1.0)
Monocytes Relative: 7 % (ref 3.0–12.0)
Neutro Abs: 1.9 K/uL (ref 1.4–7.7)
Neutrophils Relative %: 55.3 % (ref 43.0–77.0)
Platelets: 283 K/uL (ref 150.0–400.0)
RBC: 4.03 Mil/uL (ref 3.87–5.11)
RDW: 13.4 % (ref 11.5–15.5)
WBC: 3.4 K/uL — ABNORMAL LOW (ref 4.0–10.5)

## 2024-08-27 LAB — HEMOGLOBIN A1C: Hgb A1c MFr Bld: 6 % (ref 4.6–6.5)

## 2024-08-27 LAB — TSH: TSH: 5.05 u[IU]/mL (ref 0.35–5.50)

## 2024-08-27 NOTE — Assessment & Plan Note (Addendum)
 Patient's thyroid  function is  underactive on current levothyroxine  dose of  100 mcg 6 days per week.  Advised to increase to 6.5 tablets per week and recheck in 6  week

## 2024-08-31 LAB — FRUCTOSAMINE: Fructosamine: 255 umol/L (ref 205–285)

## 2024-09-01 NOTE — Assessment & Plan Note (Addendum)
 Jan 2026 Fructosamine value and A1c are congruent at 5.945;  6.0  respectively

## 2025-01-11 ENCOUNTER — Encounter: Admitting: Internal Medicine
